# Patient Record
Sex: Male | Born: 1955
Health system: Southern US, Community
[De-identification: ages and names within clinical notes are randomized; demographics above are authoritative.]

## PROBLEM LIST (undated history)

## (undated) DIAGNOSIS — E119 Type 2 diabetes mellitus without complications: Secondary | ICD-10-CM

## (undated) DIAGNOSIS — R809 Proteinuria, unspecified: Secondary | ICD-10-CM

## (undated) DIAGNOSIS — T8859XA Other complications of anesthesia, initial encounter: Secondary | ICD-10-CM

## (undated) DIAGNOSIS — E039 Hypothyroidism, unspecified: Secondary | ICD-10-CM

## (undated) DIAGNOSIS — I10 Essential (primary) hypertension: Secondary | ICD-10-CM

## (undated) DIAGNOSIS — J189 Pneumonia, unspecified organism: Secondary | ICD-10-CM

## (undated) DIAGNOSIS — J45909 Unspecified asthma, uncomplicated: Secondary | ICD-10-CM

## (undated) DIAGNOSIS — IMO0001 Reserved for inherently not codable concepts without codable children: Secondary | ICD-10-CM

## (undated) DIAGNOSIS — J449 Chronic obstructive pulmonary disease, unspecified: Secondary | ICD-10-CM

## (undated) DIAGNOSIS — R011 Cardiac murmur, unspecified: Secondary | ICD-10-CM

## (undated) DIAGNOSIS — T4145XA Adverse effect of unspecified anesthetic, initial encounter: Secondary | ICD-10-CM

---

## 1998-01-25 HISTORY — PX: CORONARY ANGIOPLASTY: SHX604

## 2001-11-23 ENCOUNTER — Inpatient Hospital Stay (HOSPITAL_COMMUNITY): Admission: EM | Admit: 2001-11-23 | Discharge: 2001-11-28 | Payer: Self-pay | Admitting: Internal Medicine

## 2001-11-24 ENCOUNTER — Encounter: Payer: Self-pay | Admitting: Internal Medicine

## 2001-11-28 ENCOUNTER — Encounter: Payer: Self-pay | Admitting: Cardiology

## 2012-01-11 ENCOUNTER — Other Ambulatory Visit: Payer: Self-pay | Admitting: Neurosurgery

## 2012-01-12 ENCOUNTER — Encounter (HOSPITAL_COMMUNITY): Payer: Self-pay | Admitting: Pharmacy Technician

## 2012-01-12 ENCOUNTER — Encounter (HOSPITAL_COMMUNITY): Payer: Self-pay

## 2012-01-12 ENCOUNTER — Encounter (HOSPITAL_COMMUNITY)
Admission: RE | Admit: 2012-01-12 | Discharge: 2012-01-12 | Disposition: A | Discharge status: Home / Self Care | Payer: Managed Care, Other (non HMO) | Source: Ambulatory Visit | Attending: Neurosurgery | Admitting: Neurosurgery

## 2012-01-12 HISTORY — DX: Chronic obstructive pulmonary disease, unspecified: J44.9

## 2012-01-12 HISTORY — DX: Hypothyroidism, unspecified: E03.9

## 2012-01-12 HISTORY — DX: Unspecified asthma, uncomplicated: J45.909

## 2012-01-12 HISTORY — DX: Cardiac murmur, unspecified: R01.1

## 2012-01-12 HISTORY — DX: Type 2 diabetes mellitus without complications: E11.9

## 2012-01-12 HISTORY — DX: Proteinuria, unspecified: R80.9

## 2012-01-12 LAB — SURGICAL PCR SCREEN
MRSA, PCR: NEGATIVE
Staphylococcus aureus: NEGATIVE

## 2012-01-12 LAB — BASIC METABOLIC PANEL
CO2: 23 mEq/L (ref 19–32)
GFR calc non Af Amer: >90 mL/min (ref >90)
Glucose, Bld: 170 mg/dL — ABNORMAL HIGH (ref 70–99)
Potassium: 4.7 mEq/L (ref 3.5–5.1)
Sodium: 136 mEq/L (ref 135–145)

## 2012-01-12 LAB — CBC
Hemoglobin: 14.9 g/dL (ref 13.0–17.0)
Platelets: 269 10*3/uL (ref 150–400)
RBC: 4.88 MIL/uL (ref 4.22–5.81)

## 2012-01-12 NOTE — Progress Notes (Signed)
Primary Physician - Dr. Renae Fickle in [place] Does not have a cardiologist  Stress test and ekg within last 2 years - will request from [place] [facility] Chest xray  May/june 2013 - will request records

## 2012-01-12 NOTE — Pre-Procedure Instructions (Signed)
20 Scott Koch  01/12/2012   Your procedure is scheduled on:  Thursday, December 19th  Report to [facility] at Call at 800 AM at [phone] to find out what time to arrive for surgery.  Call this number if you have problems the morning of surgery: [phone]   Remember:   Do not eat food or drink:After Midnight.    Take these medicines the morning of surgery with A SIP OF WATER:    Do not wear jewelry, make-up or nail polish.  Do not wear lotions, powders, or perfumes.   Do not shave 48 hours prior to surgery. Men may shave face and neck.  Do not bring valuables to the hospital.  Contacts, dentures or bridgework may not be worn into surgery.  Leave suitcase in the car. After surgery it may be brought to your room.  For patients admitted to the hospital, checkout time is 11:00 AM the day of discharge.   Patients discharged the day of surgery will not be allowed to drive home.    Special Instructions:    Please read over the following fact sheets that you were given: Pain Booklet, Coughing and Deep Breathing, MRSA Information and Surgical Site Infection Prevention

## 2012-01-13 ENCOUNTER — Encounter (HOSPITAL_COMMUNITY): Payer: Self-pay | Admitting: *Deleted

## 2012-01-13 ENCOUNTER — Ambulatory Visit (HOSPITAL_COMMUNITY): Payer: Managed Care, Other (non HMO) | Admitting: Critical Care Medicine

## 2012-01-13 ENCOUNTER — Encounter (HOSPITAL_COMMUNITY): Payer: Self-pay | Admitting: Critical Care Medicine

## 2012-01-13 ENCOUNTER — Ambulatory Visit (HOSPITAL_COMMUNITY): Payer: Managed Care, Other (non HMO)

## 2012-01-13 ENCOUNTER — Encounter (HOSPITAL_COMMUNITY)
Admission: RE | Disposition: A | Discharge status: Home / Self Care | Payer: Self-pay | Source: Ambulatory Visit | Attending: Neurosurgery

## 2012-01-13 ENCOUNTER — Ambulatory Visit (HOSPITAL_COMMUNITY)
Admission: RE | Admit: 2012-01-13 | Discharge: 2012-01-14 | Disposition: A | Discharge status: Home / Self Care | Payer: Managed Care, Other (non HMO) | Source: Ambulatory Visit | Attending: Neurosurgery | Admitting: Neurosurgery

## 2012-01-13 DIAGNOSIS — Z01810 Encounter for preprocedural cardiovascular examination: Secondary | ICD-10-CM | POA: Insufficient documentation

## 2012-01-13 DIAGNOSIS — M47812 Spondylosis without myelopathy or radiculopathy, cervical region: Secondary | ICD-10-CM | POA: Insufficient documentation

## 2012-01-13 DIAGNOSIS — E119 Type 2 diabetes mellitus without complications: Secondary | ICD-10-CM | POA: Insufficient documentation

## 2012-01-13 DIAGNOSIS — Z7902 Long term (current) use of antithrombotics/antiplatelets: Secondary | ICD-10-CM | POA: Insufficient documentation

## 2012-01-13 DIAGNOSIS — Z79899 Other long term (current) drug therapy: Secondary | ICD-10-CM | POA: Insufficient documentation

## 2012-01-13 DIAGNOSIS — M5020 Other cervical disc displacement, unspecified cervical region: Secondary | ICD-10-CM | POA: Insufficient documentation

## 2012-01-13 DIAGNOSIS — J449 Chronic obstructive pulmonary disease, unspecified: Secondary | ICD-10-CM | POA: Insufficient documentation

## 2012-01-13 DIAGNOSIS — J4489 Other specified chronic obstructive pulmonary disease: Secondary | ICD-10-CM | POA: Insufficient documentation

## 2012-01-13 DIAGNOSIS — Z01812 Encounter for preprocedural laboratory examination: Secondary | ICD-10-CM | POA: Insufficient documentation

## 2012-01-13 HISTORY — PX: ANTERIOR CERVICAL DECOMP/DISCECTOMY FUSION: SHX1161

## 2012-01-13 LAB — GLUCOSE, CAPILLARY
Glucose-Capillary: 172 mg/dL — ABNORMAL HIGH (ref 70–99)
Glucose-Capillary: 153 mg/dL — ABNORMAL HIGH (ref 70–99)
Glucose-Capillary: 150 mg/dL — ABNORMAL HIGH (ref 70–99)

## 2012-01-13 SURGERY — ANTERIOR CERVICAL DECOMPRESSION/DISCECTOMY FUSION 3 LEVELS
Anesthesia: General | Site: Neck | Wound class: Clean

## 2012-01-13 MED ORDER — ONDANSETRON HCL 4 MG/2ML IJ SOLN
INTRAMUSCULAR | Status: DC | PRN
  Administered 2012-01-13: 4 mg via INTRAVENOUS

## 2012-01-13 MED ORDER — THROMBIN 20000 UNITS EX SOLR
CUTANEOUS | Status: DC | PRN
  Administered 2012-01-13: 18:00:00 via TOPICAL

## 2012-01-13 MED ORDER — OXYCODONE HCL 5 MG/5ML PO SOLN: 5.0000 mg | Freq: Once | ORAL | Status: DC | PRN

## 2012-01-13 MED ORDER — CYCLOBENZAPRINE HCL 10 MG PO TABS
10.0000 mg | ORAL_TABLET | Freq: Three times a day (TID) | ORAL | Status: DC | PRN
  Administered 2012-01-14: 10 mg via ORAL
  Filled 2012-01-13: qty 1

## 2012-01-13 MED ORDER — HYDROMORPHONE HCL PF 1 MG/ML IJ SOLN
0.2500 mg | INTRAMUSCULAR | Status: DC | PRN
  Administered 2012-01-13 (×2): 0.5 mg via INTRAVENOUS

## 2012-01-13 MED ORDER — HEMOSTATIC AGENTS (NO CHARGE) OPTIME
TOPICAL | Status: DC | PRN
  Administered 2012-01-13: 1 via TOPICAL

## 2012-01-13 MED ORDER — DEXAMETHASONE SODIUM PHOSPHATE 4 MG/ML IJ SOLN
INTRAMUSCULAR | Status: DC | PRN
  Administered 2012-01-13: 8 mg via INTRAVENOUS

## 2012-01-13 MED ORDER — VECURONIUM BROMIDE 10 MG IV SOLR
INTRAVENOUS | Status: DC | PRN
  Administered 2012-01-13: 2 mg via INTRAVENOUS
  Administered 2012-01-13: 1 mg via INTRAVENOUS
  Administered 2012-01-13: 2 mg via INTRAVENOUS
  Administered 2012-01-13: 1 mg via INTRAVENOUS
  Administered 2012-01-13: 2 mg via INTRAVENOUS
  Administered 2012-01-13 (×2): 1 mg via INTRAVENOUS

## 2012-01-13 MED ORDER — ASPIRIN EC 81 MG PO TBEC
81.0000 mg | DELAYED_RELEASE_TABLET | Freq: Every day | ORAL | Status: DC
  Filled 2012-01-13: qty 1

## 2012-01-13 MED ORDER — LISINOPRIL 10 MG PO TABS
10.0000 mg | ORAL_TABLET | Freq: Every day | ORAL | Status: DC
  Administered 2012-01-13: 10 mg via ORAL
  Filled 2012-01-13 (×2): qty 1

## 2012-01-13 MED ORDER — NICOTINE 21 MG/24HR TD PT24
21.0000 mg | MEDICATED_PATCH | TRANSDERMAL | Status: DC
  Administered 2012-01-13: 21 mg via TRANSDERMAL
  Filled 2012-01-13 (×2): qty 1

## 2012-01-13 MED ORDER — MIDAZOLAM HCL 5 MG/5ML IJ SOLN
INTRAMUSCULAR | Status: DC | PRN
  Administered 2012-01-13 (×2): 2 mg via INTRAVENOUS

## 2012-01-13 MED ORDER — SODIUM CHLORIDE 0.9 % IV SOLN
INTRAVENOUS | Status: AC
  Filled 2012-01-13: qty 500

## 2012-01-13 MED ORDER — BACITRACIN 50000 UNITS IM SOLR
INTRAMUSCULAR | Status: AC
  Filled 2012-01-13: qty 1

## 2012-01-13 MED ORDER — LIDOCAINE HCL (CARDIAC) 20 MG/ML IV SOLN
INTRAVENOUS | Status: DC | PRN
  Administered 2012-01-13: 80 mg via INTRAVENOUS

## 2012-01-13 MED ORDER — THROMBIN 5000 UNITS EX SOLR
CUTANEOUS | Status: DC | PRN
  Administered 2012-01-13: 5000 [IU] via TOPICAL

## 2012-01-13 MED ORDER — HYDROMORPHONE HCL PF 1 MG/ML IJ SOLN
INTRAMUSCULAR | Status: AC
  Filled 2012-01-13: qty 1

## 2012-01-13 MED ORDER — ROCURONIUM BROMIDE 100 MG/10ML IV SOLN
INTRAVENOUS | Status: DC | PRN
  Administered 2012-01-13: 50 mg via INTRAVENOUS

## 2012-01-13 MED ORDER — LEVOTHYROXINE SODIUM 100 MCG PO TABS
100.0000 ug | ORAL_TABLET | Freq: Every day | ORAL | Status: DC
  Filled 2012-01-13: qty 1

## 2012-01-13 MED ORDER — FENTANYL CITRATE 0.05 MG/ML IJ SOLN
INTRAMUSCULAR | Status: AC
  Filled 2012-01-13: qty 2

## 2012-01-13 MED ORDER — PROPOFOL 10 MG/ML IV BOLUS
INTRAVENOUS | Status: DC | PRN
  Administered 2012-01-13: 200 mg via INTRAVENOUS

## 2012-01-13 MED ORDER — SODIUM CHLORIDE 0.9 % IV SOLN
250.0000 mL | INTRAVENOUS | Status: DC
  Administered 2012-01-13: 250 mL via INTRAVENOUS

## 2012-01-13 MED ORDER — OXYCODONE-ACETAMINOPHEN 5-325 MG PO TABS: 2.0000 | ORAL_TABLET | ORAL | Status: DC | PRN

## 2012-01-13 MED ORDER — ACETAMINOPHEN 325 MG PO TABS: 650.0000 mg | ORAL_TABLET | ORAL | Status: DC | PRN

## 2012-01-13 MED ORDER — METOCLOPRAMIDE HCL 5 MG/ML IJ SOLN: 10.0000 mg | Freq: Once | INTRAMUSCULAR | Status: DC | PRN

## 2012-01-13 MED ORDER — CEFAZOLIN SODIUM 1-5 GM-% IV SOLN: 1.0000 g | Freq: Three times a day (TID) | INTRAVENOUS | Status: DC

## 2012-01-13 MED ORDER — METFORMIN HCL ER 750 MG PO TB24
750.0000 mg | ORAL_TABLET | Freq: Two times a day (BID) | ORAL | Status: DC
  Administered 2012-01-14: 750 mg via ORAL
  Filled 2012-01-13 (×3): qty 1

## 2012-01-13 MED ORDER — PHENYLEPHRINE HCL 10 MG/ML IJ SOLN
INTRAMUSCULAR | Status: DC | PRN
  Administered 2012-01-13: 20 ug via INTRAVENOUS
  Administered 2012-01-13: 40 ug via INTRAVENOUS
  Administered 2012-01-13: 20 ug via INTRAVENOUS
  Administered 2012-01-13: 40 ug via INTRAVENOUS

## 2012-01-13 MED ORDER — SODIUM CHLORIDE 0.9 % IR SOLN
Status: DC | PRN
  Administered 2012-01-13: 18:00:00

## 2012-01-13 MED ORDER — ALBUTEROL SULFATE HFA 108 (90 BASE) MCG/ACT IN AERS: 2.0000 | INHALATION_SPRAY | Freq: Four times a day (QID) | RESPIRATORY_TRACT | Status: DC | PRN

## 2012-01-13 MED ORDER — SODIUM CHLORIDE 0.9 % IJ SOLN: 3.0000 mL | INTRAMUSCULAR | Status: DC | PRN

## 2012-01-13 MED ORDER — ATORVASTATIN CALCIUM 10 MG PO TABS
10.0000 mg | ORAL_TABLET | Freq: Every day | ORAL | Status: DC
  Filled 2012-01-13: qty 1

## 2012-01-13 MED ORDER — DOCUSATE SODIUM 100 MG PO CAPS
100.0000 mg | ORAL_CAPSULE | Freq: Two times a day (BID) | ORAL | Status: DC
  Administered 2012-01-13: 100 mg via ORAL
  Filled 2012-01-13: qty 1

## 2012-01-13 MED ORDER — LACTATED RINGERS IV SOLN
INTRAVENOUS | Status: DC | PRN
  Administered 2012-01-13 (×3): via INTRAVENOUS

## 2012-01-13 MED ORDER — SODIUM CHLORIDE 0.9 % IJ SOLN: 3.0000 mL | Freq: Two times a day (BID) | INTRAMUSCULAR | Status: DC

## 2012-01-13 MED ORDER — ACETAMINOPHEN 650 MG RE SUPP: 650.0000 mg | RECTAL | Status: DC | PRN

## 2012-01-13 MED ORDER — VANCOMYCIN HCL IN DEXTROSE 1-5 GM/200ML-% IV SOLN
INTRAVENOUS | Status: AC
  Administered 2012-01-13: 1000 mg via INTRAVENOUS
  Filled 2012-01-13: qty 200

## 2012-01-13 MED ORDER — GLYCOPYRROLATE 0.2 MG/ML IJ SOLN
INTRAMUSCULAR | Status: DC | PRN
  Administered 2012-01-13: 0.6 mg via INTRAVENOUS

## 2012-01-13 MED ORDER — NEOSTIGMINE METHYLSULFATE 1 MG/ML IJ SOLN
INTRAMUSCULAR | Status: DC | PRN
  Administered 2012-01-13: 4 mg via INTRAVENOUS

## 2012-01-13 MED ORDER — LIDOCAINE HCL 4 % MT SOLN
OROMUCOSAL | Status: DC | PRN
  Administered 2012-01-13: 4 mL via TOPICAL

## 2012-01-13 MED ORDER — 0.9 % SODIUM CHLORIDE (POUR BTL) OPTIME
TOPICAL | Status: DC | PRN
  Administered 2012-01-13: 1000 mL

## 2012-01-13 MED ORDER — MENTHOL 3 MG MT LOZG: 1.0000 | LOZENGE | OROMUCOSAL | Status: DC | PRN

## 2012-01-13 MED ORDER — OXYCODONE HCL 5 MG PO TABS: 5.0000 mg | ORAL_TABLET | Freq: Once | ORAL | Status: DC | PRN

## 2012-01-13 MED ORDER — HYDROMORPHONE HCL PF 1 MG/ML IJ SOLN: 0.5000 mg | INTRAMUSCULAR | Status: DC | PRN

## 2012-01-13 MED ORDER — ONDANSETRON HCL 4 MG/2ML IJ SOLN: 4.0000 mg | INTRAMUSCULAR | Status: DC | PRN

## 2012-01-13 MED ORDER — VANCOMYCIN HCL IN DEXTROSE 1-5 GM/200ML-% IV SOLN
1000.0000 mg | Freq: Once | INTRAVENOUS | Status: AC
  Administered 2012-01-14: 1000 mg via INTRAVENOUS
  Filled 2012-01-13: qty 200

## 2012-01-13 MED ORDER — PHENOL 1.4 % MT LIQD: 1.0000 | OROMUCOSAL | Status: DC | PRN

## 2012-01-13 MED ORDER — FENTANYL CITRATE 0.05 MG/ML IJ SOLN
INTRAMUSCULAR | Status: DC | PRN
Start: 2012-01-13 — End: 2012-01-13
  Administered 2012-01-13 (×5): 50 ug via INTRAVENOUS
  Administered 2012-01-13: 150 ug via INTRAVENOUS
  Administered 2012-01-13: 100 ug via INTRAVENOUS
  Administered 2012-01-13 (×2): 50 ug via INTRAVENOUS

## 2012-01-13 MED ORDER — OXYCODONE-ACETAMINOPHEN 5-325 MG PO TABS
1.0000 | ORAL_TABLET | ORAL | Status: DC | PRN
  Administered 2012-01-14 (×2): 2 via ORAL
  Filled 2012-01-13 (×2): qty 2

## 2012-01-13 SURGICAL SUPPLY — 62 items
ADH SKN CLS APL DERMABOND .7 (GAUZE/BANDAGES/DRESSINGS) ×2
APL SKNCLS STERI-STRIP NONHPOA (GAUZE/BANDAGES/DRESSINGS) ×2
BAG DECANTER FOR FLEXI CONT (MISCELLANEOUS) ×2 IMPLANT
BENZOIN TINCTURE PRP APPL 2/3 (GAUZE/BANDAGES/DRESSINGS) ×3 IMPLANT
BIT DRILL SM SPINE QC 14 (BIT) ×1 IMPLANT
BRUSH SCRUB EZ PLAIN DRY (MISCELLANEOUS) ×2 IMPLANT
BUR MATCHSTICK NEURO 3.0 LAGG (BURR) ×2 IMPLANT
CANISTER SUCTION 2500CC (MISCELLANEOUS) ×2 IMPLANT
CLOTH BEACON ORANGE TIMEOUT ST (SAFETY) ×2 IMPLANT
CONT SPEC 4OZ CLIKSEAL STRL BL (MISCELLANEOUS) ×2 IMPLANT
DECANTER SPIKE VIAL GLASS SM (MISCELLANEOUS) ×2 IMPLANT
DERMABOND ADVANCED (GAUZE/BANDAGES/DRESSINGS) ×2
DERMABOND ADVANCED .7 DNX12 (GAUZE/BANDAGES/DRESSINGS) ×1 IMPLANT
DRAPE C-ARM 42X72 X-RAY (DRAPES) ×4 IMPLANT
DRAPE LAPAROTOMY 100X72 PEDS (DRAPES) ×2 IMPLANT
DRAPE MICROSCOPE ZEISS OPMI (DRAPES) ×1 IMPLANT
DRAPE POUCH INSTRU U-SHP 10X18 (DRAPES) ×2 IMPLANT
DRSG OPSITE 4X5.5 SM (GAUZE/BANDAGES/DRESSINGS) ×3 IMPLANT
ELECT COATED BLADE 2.86 ST (ELECTRODE) ×2 IMPLANT
ELECT REM PT RETURN 9FT ADLT (ELECTROSURGICAL) ×2
ELECTRODE REM PT RTRN 9FT ADLT (ELECTROSURGICAL) ×1 IMPLANT
GAUZE SPONGE 4X4 16PLY XRAY LF (GAUZE/BANDAGES/DRESSINGS) ×1 IMPLANT
GLOVE BIO SURGEON STRL SZ8 (GLOVE) ×2 IMPLANT
GLOVE BIOGEL PI IND STRL 7.5 (GLOVE) IMPLANT
GLOVE BIOGEL PI INDICATOR 7.5 (GLOVE) ×1
GLOVE ECLIPSE 7.5 STRL STRAW (GLOVE) ×2 IMPLANT
GLOVE EXAM NITRILE LRG STRL (GLOVE) ×1 IMPLANT
GLOVE EXAM NITRILE MD LF STRL (GLOVE) ×1 IMPLANT
GLOVE EXAM NITRILE XL STR (GLOVE) IMPLANT
GLOVE EXAM NITRILE XS STR PU (GLOVE) IMPLANT
GLOVE INDICATOR 8.5 STRL (GLOVE) ×2 IMPLANT
GOWN BRE IMP SLV AUR LG STRL (GOWN DISPOSABLE) ×1 IMPLANT
GOWN BRE IMP SLV AUR XL STRL (GOWN DISPOSABLE) ×3 IMPLANT
GOWN STRL REIN 2XL LVL4 (GOWN DISPOSABLE) IMPLANT
HEAD HALTER (SOFTGOODS) ×2 IMPLANT
HEMOSTAT POWDER KIT SURGIFOAM (HEMOSTASIS) ×1 IMPLANT
KIT BASIN OR (CUSTOM PROCEDURE TRAY) ×2 IMPLANT
KIT ROOM TURNOVER OR (KITS) ×2 IMPLANT
NDL SPNL 20GX3.5 QUINCKE YW (NEEDLE) ×1 IMPLANT
NEEDLE SPNL 20GX3.5 QUINCKE YW (NEEDLE) ×2 IMPLANT
NS IRRIG 1000ML POUR BTL (IV SOLUTION) ×2 IMPLANT
PACK LAMINECTOMY NEURO (CUSTOM PROCEDURE TRAY) ×2 IMPLANT
PLATE ANT CERV XTEND 1 LV 14 (Plate) ×1 IMPLANT
PLATE ANT CERV XTEND 2 LV 32 (Plate) ×1 IMPLANT
PUTTY BONE DBX 2.5 MIS (Bone Implant) ×1 IMPLANT
RUBBERBAND STERILE (MISCELLANEOUS) ×4 IMPLANT
SCREW XTD VAR 4.2 SELF TAP (Screw) ×10 IMPLANT
SPACER COLONIAL 7 SZ 9 (Spacer) ×1 IMPLANT
SPACER COLONIAL LGE 8MM 7DEG (Spacer) ×2 IMPLANT
SPONGE GAUZE 4X4 12PLY (GAUZE/BANDAGES/DRESSINGS) ×2 IMPLANT
SPONGE INTESTINAL PEANUT (DISPOSABLE) ×2 IMPLANT
SPONGE SURGIFOAM ABS GEL 100 (HEMOSTASIS) ×2 IMPLANT
STRIP CLOSURE SKIN 1/2X4 (GAUZE/BANDAGES/DRESSINGS) ×2 IMPLANT
SUT VIC AB 3-0 SH 8-18 (SUTURE) ×3 IMPLANT
SUT VICRYL 4-0 PS2 18IN ABS (SUTURE) ×2 IMPLANT
SYR 20ML ECCENTRIC (SYRINGE) ×2 IMPLANT
TAPE CLOTH 4X10 WHT NS (GAUZE/BANDAGES/DRESSINGS) IMPLANT
TAPE STRIPS DRAPE STRL (GAUZE/BANDAGES/DRESSINGS) ×2 IMPLANT
TOWEL OR 17X24 6PK STRL BLUE (TOWEL DISPOSABLE) ×2 IMPLANT
TOWEL OR 17X26 10 PK STRL BLUE (TOWEL DISPOSABLE) ×3 IMPLANT
TRAP SPECIMEN MUCOUS 40CC (MISCELLANEOUS) ×2 IMPLANT
WATER STERILE IRR 1000ML POUR (IV SOLUTION) ×2 IMPLANT

## 2012-01-13 NOTE — Progress Notes (Signed)
Orthopedic Tech Progress Note Patient Details:  Scott Koch 09-Feb-1955 [id number]  Ortho Devices Type of Ortho Device: Soft collar Ortho Device/Splint Location: neck Ortho Device/Splint Interventions: Application   Jennye Moccasin 01/13/2012, 9:09 PM

## 2012-01-13 NOTE — Anesthesia Preprocedure Evaluation (Signed)
Anesthesia Evaluation  Patient identified by MRN, date of birth, ID band Patient awake    Reviewed: Allergy & Precautions, H&P , NPO status , Patient's Chart, lab work & pertinent test results  Airway       Dental  (+) Dental Advisory Given   Pulmonary asthma , COPD COPD inhaler,          Cardiovascular     Neuro/Psych    GI/Hepatic   Endo/Other  diabetes, Oral Hypoglycemic AgentsHypothyroidism   Renal/GU      Musculoskeletal   Abdominal   Peds  Hematology   Anesthesia Other Findings   Reproductive/Obstetrics                           Anesthesia Physical Anesthesia Plan  ASA: III  Anesthesia Plan: General   Post-op Pain Management:    Induction: Intravenous  Airway Management Planned: Oral ETT  Additional Equipment:   Intra-op Plan:   Post-operative Plan: Extubation in OR  Informed Consent: I have reviewed the patients History and Physical, chart, labs and discussed the procedure including the risks, benefits and alternatives for the proposed anesthesia with the patient or authorized representative who has indicated his/her understanding and acceptance.   Dental advisory given  Plan Discussed with: Anesthesiologist and Surgeon  Anesthesia Plan Comments:         Anesthesia Quick Evaluation

## 2012-01-13 NOTE — H&P (Signed)
Scott Koch is an 56 y.o. male.   Chief Complaint: Neck and left arm pain HPI: This is for PLIF to 48-year-old zones of progressive worsening neck and left arm pain ring to his shoulder down to his triceps in the first and is located numbness tingling seems to be shin and some contractures and withdrawing both hands workup I/O included MRI scan which showed a large disc herniation C6-7 displaced left C7 nerve root patient did strike exercise therapy without any significant relief he is on prednisone which helped some of his pain but did not helping of his weakness and has significant left arm weakness. On exam his weakness in his left triceps 4-5 his MRI scan showed spondylitic compression at C3-4 C4-5-1 large disc herniation C6-7 this was left C7 nerve roots I recommended a three-level enter cervical discectomy and fusion.  I have extensively explained the risks and benefits as well as perioperative course expectations of outcome and alternatives to surgery a.m. he understands and agrees to proceed forward.  Past Medical History  Diagnosis Date  . Asthma     inhaler used only when seasonal allergies   . Hypothyroidism   . COPD (chronic obstructive pulmonary disease)   . Heart murmur   . Protein in urine   . Diabetes mellitus without complication     fasting blood sugar avg 120    Past Surgical History  Procedure Date  . Coronary angioplasty     stent    History reviewed. No pertinent family history. Social History:  reports that he has been smoking Cigarettes.  He has a 35 pack-year smoking history. He does not have any smokeless tobacco history on file. He reports that he does not drink alcohol or use illicit drugs.  Allergies:  Allergies  Allergen Reactions  . Other Anaphylaxis    hazelnut  . Penicillins Anaphylaxis  . Sulfa Antibiotics Hives    Medications Prior to Admission  Medication Sig Dispense Refill  . albuterol (PROVENTIL HFA;VENTOLIN HFA) 108 (90 BASE) MCG/ACT inhaler  Inhale 2 puffs into the lungs every 6 (six) hours as needed. For shortness of breath      . aspirin EC 81 MG tablet Take 81 mg by mouth daily.      Marland Kitchen levothyroxine (SYNTHROID, LEVOTHROID) 100 MCG tablet Take 100 mcg by mouth daily.      Marland Kitchen lisinopril (PRINIVIL,ZESTRIL) 10 MG tablet Take 10 mg by mouth daily.      . metFORMIN (GLUCOPHAGE-XR) 750 MG 24 hr tablet Take 750 mg by mouth 2 (two) times daily.      . nicotine (NICODERM CQ - DOSED IN MG/24 HOURS) 21 mg/24hr patch Place 1 patch onto the skin daily.      Marland Kitchen oxyCODONE-acetaminophen (PERCOCET) 10-325 MG per tablet Take 1 tablet by mouth 4 (four) times daily as needed. For pain      . rosuvastatin (CRESTOR) 10 MG tablet Take 10 mg by mouth daily.        Results for orders placed during the hospital encounter of 01/13/12 (from the past 48 hour(s))  GLUCOSE, CAPILLARY     Status: Abnormal   Collection Time   01/13/12  1:42 PM      Component Value Range Comment   Glucose-Capillary 172 (*) 70 - 99 mg/dL   GLUCOSE, CAPILLARY     Status: Abnormal   Collection Time   01/13/12  3:38 PM      Component Value Range Comment   Glucose-Capillary 153 (*) 70 - 99  mg/dL    No results found.  Review of Systems  Constitutional: Negative.   HENT: Positive for neck pain.   Respiratory: Negative.   Cardiovascular: Negative.   Gastrointestinal: Negative.   Genitourinary: Negative.   Musculoskeletal: Positive for myalgias.  Skin: Negative.   Neurological: Positive for tingling and tremors.  Endo/Heme/Allergies: Negative.   Psychiatric/Behavioral: Negative.     Blood pressure 114/74, pulse 99, temperature 98.2 F (36.8 C), temperature source Oral, resp. rate 18, SpO2 97.00%. Physical Exam  Constitutional: He is oriented to person, place, and time. He appears well-developed and well-nourished.  HENT:  Head: Normocephalic.  Eyes: Pupils are equal, round, and reactive to light.  Neck: Normal range of motion.  Cardiovascular: Normal rate.    Respiratory: Effort normal.  GI: Soft. Bowel sounds are normal.  Neurological: He is alert and oriented to person, place, and time. GCS eye subscore is 4. GCS verbal subscore is 5. GCS motor subscore is 6.  Reflex Scores:      Tricep reflexes are 1+ on the right side and 1+ on the left side.      Bicep reflexes are 1+ on the right side and 1+ on the left side.      Brachioradialis reflexes are 1+ on the right side and 1+ on the left side.      Patellar reflexes are 1+ on the right side and 1+ on the left side.      Achilles reflexes are 1+ on the right side and 1+ on the left side.      Right upper extremity strength is 5 out of 5 and his deltoid biceps triceps wrist flexion extension and intrinsics left upper chart C. strength is weak in his triceps are 4 minus out of 5 otherwise he 5 out of 5     Assessment/Plan 55 year old presents for an ACDF at C34, 45, and C6-7  Scott Koch P 01/13/2012, 4:38 PM

## 2012-01-13 NOTE — Preoperative (Signed)
Beta Blockers   Reason not to administer Beta Blockers:Not Applicable 

## 2012-01-13 NOTE — Progress Notes (Signed)
Ortho tech visited and put on correct soft collar size Pt moves all extremities tc feels sensation and pain is 4-5

## 2012-01-13 NOTE — Op Note (Signed)
Preoperative diagnosis: Cervical stenosis C3-4 C4-5 and herniated nucleus pulposus at C6-7 with left-sided C7 radiculopathy and neck pain  Postoperative diagnosis: Same  Procedure: Anterior cervical discectomy and fusion at C3-4 and C4-5 using 8 mm globus peek cages packed with local autograft mixed with DBX and a 32 mm globus extend plate with 295 mm variable-angle screws and into a separate skin incision an anterior cervical discectomy and fusion at C6-7 using a 9 mm globus peek cage packable local are graft mixed DBX and a 14 mm 8 globus extend plate with 621 mm verbal angle screws  Surgeon: Jillyn Hidden Anedra Penafiel  Assistant: Coletta Memos  Anesthesia: Gen.  EBL: Minimal  History of present illness: Patient is a very pleasant 56 year old gentleman who has had progressive worsening neck and prominent left shoulder and arm pain over the last 4 weeks this failed all forms of conservative with exercise therapy and oral prednisone patient clinical exam and significant weakness in his left tricep and workup revealed a large herniated disc at C6-7 displacing the left C7 nerve root in addition also showed severe stenosis spinal cord compression at C3-4 and C4-5 the patient recommended anterior service had discectomies and fusion at C3-4 C4-5 and C6-7 axis with a risks and benefits of the operation with him as well as perioperative course and expectations of outcome and alternatives of surgery he understands and agrees to proceed forward.  Operative procedure: Patient brought into the or was induced under general anesthesia positioned supine the neck in slight extension in 5 pounds of halter traction the right-sided expansion draped in routine sterile fashion 2 incisions were drawn out and confirmed to be the appropriate level with fluoroscopy simple incisions were opened up the superficial layer of the platysma was dissected and divided longitudinally the avascular plane to sternomastoid and strap as was developed a  prefascial from both exposures initially C3-4 C4-5 were exposed and confirmed with interoperative x-ray then this was packed and retractor was removed intense taken at C6-7 at C6-7 the C6-7 disc spaces identified confirmed with intraoperative x-ray and then the disc space was incised with the rongeurs were used to clean the anterior margins of the annulus anterior aspect removed with him a Kerrison punch and the disc space was drilled down a high-speed drill calf in the bone shavings in a mucous trap and then under microscopic illumination the disc space was further drilled down aggressive and viable template in help identify the PLL which was removed on the right side canal in piecemeal fashion. This exposed the thecal sac and in thecal sac was immediately noted to be displaced ventrally from a large free fragment disc herniation this was teased out of the canal with I nerve hook and then under great under biting both endplates March across laterally several more fragments disc removed from the C7 foramen on the left at the foraminotomy at C7 nerve root was widely decompressed skeletonized flush with pedicle. Then marking back to the patient's right side endplates were further under been decompress the central canal the right C7 pedicle was identified the right C7 nerve root is decompressed. Then a 9 mm cages packed inserted after adequate endplate preparation been achieved and a 40 mg placed all screws excellent purchase locking mechanism was engaged. This was impacted this taken back to the original exposure at C3-4 C4-5 with a separate skin incision. The spaces were incised both the space were drilled down capturing the bone shavings in a mucous trap limits illumination first working at C3-4  aggressive and viable template is carried out with a large posterior spur coming off the C3 vertebral body this was aggressively under been decompress the central canal marking laterally both C4 pedicles identifiable C4  nerve roots decompressed. At the discectomy there is no further stenosis either foraminally or centrally then a 8 mm cages packed inserted then tensioning C4-5 and a similar fashion C4-5 was drilled down was large free fragment disc herniation here centrally this was removed and aggressive abutting both endplates of both C5 foramina were opened up and both C5 nerve roots decompressed flush with pedicle. At the discectomy there is no further stenosis this level as well and an 8mm peek Was inserted here then a 32 template was placed all screws excellent purchase locking mechanism was engaged postop fluoroscopy confirmed good position of plate screws and implants at all levels been both at incisions were inspected to confirm it because hemostasis both incisions were irrigated both incisions were closed with after Vicryl in the platysma and a running 4 septic or benzoin and Steri-Strips were applied patient recovered in stable condition. At the end of case on it counts and sponge counts were correct.

## 2012-01-13 NOTE — Transfer of Care (Signed)
Immediate Anesthesia Transfer of Care Note  Patient: Scott Koch  Procedure(s) Performed: Procedure(s) (LRB) with comments: ANTERIOR CERVICAL DECOMPRESSION/DISCECTOMY FUSION 3 LEVELS (N/A) - Cervical three-four, cervical four five, cervical six-seven anterior cervical decompression fusion with plate   Patient Location: PACU  Anesthesia Type:General  Level of Consciousness: awake, alert  and oriented  Airway & Oxygen Therapy: Patient connected to nasal cannula oxygen  Post-op Assessment: Report given to PACU RN, Post -op Vital signs reviewed and stable and Patient moving all extremities X 4  Post vital signs: Reviewed and stable  Complications: No apparent anesthesia complications

## 2012-01-13 NOTE — Progress Notes (Signed)
Dr. Wynetta Emery visited patient at bedside

## 2012-01-13 NOTE — Progress Notes (Signed)
Visited by DR. Wynetta Emery

## 2012-01-13 NOTE — Anesthesia Postprocedure Evaluation (Signed)
Anesthesia Post Note  Patient: Scott Koch  Procedure(s) Performed: Procedure(s) (LRB): ANTERIOR CERVICAL DECOMPRESSION/DISCECTOMY FUSION 3 LEVELS (N/A)  Anesthesia type: general  Patient location: PACU  Post pain: Pain level controlled  Post assessment: Patient's Cardiovascular Status Stable  Last Vitals:  Filed Vitals:   01/13/12 2100  BP: 153/77  Pulse: 87  Temp:   Resp: 14    Post vital signs: Reviewed and stable  Level of consciousness: sedated  Complications: No apparent anesthesia complications

## 2012-01-14 LAB — GLUCOSE, CAPILLARY: Glucose-Capillary: 128 mg/dL — ABNORMAL HIGH (ref 70–99)

## 2012-01-14 MED ORDER — CYCLOBENZAPRINE HCL 10 MG PO TABS: 10.0000 mg | ORAL_TABLET | Freq: Three times a day (TID) | ORAL | Status: DC | PRN

## 2012-01-14 MED ORDER — OXYCODONE-ACETAMINOPHEN 5-325 MG PO TABS: 1.0000 | ORAL_TABLET | ORAL | Status: DC | PRN

## 2012-01-14 NOTE — Progress Notes (Signed)
Subjective: Patient reports Is feeling great no arm pain no numbness strength is coming back  Objective: Vital signs in last 24 hours: Temp:  [98.1 F (36.7 C)-98.5 F (36.9 C)] 98.1 F (36.7 C) (12/20 0809) Pulse Rate:  [65-117] 76  (12/20 0809) Resp:  [14-25] 20  (12/20 0809) BP: (114-181)/(74-99) 119/78 mmHg (12/20 0809) SpO2:  [92 %-97 %] 92 % (12/20 0809)  Intake/Output from previous day: 12/19 0701 - 12/20 0700 In: 2500 [I.V.:2500] Out: 3175 [Urine:3025; Blood:150] Intake/Output this shift: Total I/O In: -  Out: 725 [Urine:725]  Improvement in strength now for out of 5 left tricep  Lab Results:  Basename 01/12/12 1323  WBC 13.2*  HGB 14.9  HCT 44.3  PLT 269   BMET  Basename 01/12/12 1323  NA 136  K 4.7  CL 102  CO2 23  GLUCOSE 170*  BUN 21  CREATININE 0.77  CALCIUM 9.8    Studies/Results: Dg Cervical Spine 2-3 Views  01/13/2012  *RADIOLOGY REPORT*  Clinical Data: ACDF  CERVICAL SPINE - 2-3 VIEW,DG C-ARM GT 120 MIN  Comparison: MRI cervical spine 01/10/2012  Findings: To the lateral fluoroscopic spot views of the cervical spine are submitted.  There postsurgical changes of anterior cervical discectomy and fusion spanning the C3, C4, and C5, and C6- C7.  No complicating feature identified on these fluoroscopic images.  IMPRESSION: ACDF at C3-C5 and C6-C7.   Original Report Authenticated By: Britta Mccreedy, M.D.    Dg C-arm Gt 120 Min  01/13/2012  *RADIOLOGY REPORT*  Clinical Data: ACDF  CERVICAL SPINE - 2-3 VIEW,DG C-ARM GT 120 MIN  Comparison: MRI cervical spine 01/10/2012  Findings: To the lateral fluoroscopic spot views of the cervical spine are submitted.  There postsurgical changes of anterior cervical discectomy and fusion spanning the C3, C4, and C5, and C6- C7.  No complicating feature identified on these fluoroscopic images.  IMPRESSION: ACDF at C3-C5 and C6-C7.   Original Report Authenticated By: Britta Mccreedy, M.D.     Assessment/Plan: Discharge  home  LOS: 1 day     Wilena Tyndall P 01/14/2012, 9:04 AM

## 2012-01-14 NOTE — Discharge Summary (Signed)
  Physician Discharge Summary  Patient ID: Scott Koch MRN: [id number] DOB/AGE: 57-03-1955 57 y.o.  Admit date: 01/13/2012 Discharge date: 01/14/2012  Admission Diagnoses: Herniated nucleus pulposus at C6-7 cervical spondylosis and stenosis at C3-4 C4-5  Discharge Diagnoses: Same Active Problems:  * No active hospital problems. *    Discharged Condition: good  Hospital Course: Patient admitted hospital underwent anterior cervical discectomies and fusion at C3-4 C4-5 and C6-7 postoperatively patient did very well to the floor on the floor patient was convalescing well inability voiding spontaneously tolerating regular diet wound is clean and dry swallowing was significantly improved and patient stable to be discharged home.  Consults: Significant Diagnostic Studies: Treatments: ACDF C3-4, C4-5, and C6-7 Discharge Exam: Blood pressure 119/78, pulse 76, temperature 98.1 F (36.7 C), temperature source Oral, resp. rate 20, SpO2 92.00%. Strength out of 5 wound clean and dry  Disposition: Home     Medication List     As of 01/14/2012  9:06 AM    TAKE these medications         albuterol 108 (90 BASE) MCG/ACT inhaler   Commonly known as: PROVENTIL HFA;VENTOLIN HFA   Inhale 2 puffs into the lungs every 6 (six) hours as needed. For shortness of breath      aspirin EC 81 MG tablet   Take 81 mg by mouth daily.      cyclobenzaprine 10 MG tablet   Commonly known as: FLEXERIL   Take 1 tablet (10 mg total) by mouth 3 (three) times daily as needed for muscle spasms.      levothyroxine 100 MCG tablet   Commonly known as: SYNTHROID, LEVOTHROID   Take 100 mcg by mouth daily.      lisinopril 10 MG tablet   Commonly known as: PRINIVIL,ZESTRIL   Take 10 mg by mouth daily.      metFORMIN 750 MG 24 hr tablet   Commonly known as: GLUCOPHAGE-XR   Take 750 mg by mouth 2 (two) times daily.      nicotine 21 mg/24hr patch   Commonly known as: NICODERM CQ - dosed in mg/24 hours   Place  1 patch onto the skin daily.      oxyCODONE-acetaminophen 5-325 MG per tablet   Commonly known as: PERCOCET/ROXICET   Take 1-2 tablets by mouth every 4 (four) hours as needed.      oxyCODONE-acetaminophen 10-325 MG per tablet   Commonly known as: PERCOCET   Take 1 tablet by mouth 4 (four) times daily as needed. For pain      rosuvastatin 10 MG tablet   Commonly known as: CRESTOR   Take 10 mg by mouth daily.         Signed: Megann Easterwood P 01/14/2012, 9:06 AM

## 2012-01-17 ENCOUNTER — Encounter (HOSPITAL_COMMUNITY): Payer: Self-pay | Admitting: Neurosurgery

## 2012-01-26 HISTORY — PX: SHOULDER ARTHROSCOPY: SHX128

## 2013-12-17 ENCOUNTER — Other Ambulatory Visit: Payer: Self-pay | Admitting: Neurosurgery

## 2014-01-02 ENCOUNTER — Other Ambulatory Visit (HOSPITAL_COMMUNITY): Payer: Managed Care, Other (non HMO)

## 2014-01-02 ENCOUNTER — Encounter (HOSPITAL_COMMUNITY)
Admission: RE | Admit: 2014-01-02 | Discharge: 2014-01-02 | Disposition: A | Discharge status: Home / Self Care | Payer: Managed Care, Other (non HMO) | Source: Ambulatory Visit | Attending: Neurosurgery | Admitting: Neurosurgery

## 2014-01-02 ENCOUNTER — Encounter (HOSPITAL_COMMUNITY): Payer: Self-pay

## 2014-01-02 ENCOUNTER — Encounter (HOSPITAL_COMMUNITY)
Admission: RE | Admit: 2014-01-02 | Discharge: 2014-01-02 | Disposition: A | Discharge status: Home / Self Care | Payer: Managed Care, Other (non HMO) | Source: Ambulatory Visit | Attending: Anesthesiology | Admitting: Anesthesiology

## 2014-01-02 DIAGNOSIS — Z981 Arthrodesis status: Secondary | ICD-10-CM | POA: Insufficient documentation

## 2014-01-02 DIAGNOSIS — R011 Cardiac murmur, unspecified: Secondary | ICD-10-CM | POA: Insufficient documentation

## 2014-01-02 DIAGNOSIS — Z87891 Personal history of nicotine dependence: Secondary | ICD-10-CM | POA: Insufficient documentation

## 2014-01-02 DIAGNOSIS — E669 Obesity, unspecified: Secondary | ICD-10-CM | POA: Diagnosis not present

## 2014-01-02 DIAGNOSIS — I10 Essential (primary) hypertension: Secondary | ICD-10-CM | POA: Insufficient documentation

## 2014-01-02 DIAGNOSIS — J449 Chronic obstructive pulmonary disease, unspecified: Secondary | ICD-10-CM | POA: Insufficient documentation

## 2014-01-02 DIAGNOSIS — I2510 Atherosclerotic heart disease of native coronary artery without angina pectoris: Secondary | ICD-10-CM | POA: Diagnosis not present

## 2014-01-02 DIAGNOSIS — E119 Type 2 diabetes mellitus without complications: Secondary | ICD-10-CM | POA: Insufficient documentation

## 2014-01-02 DIAGNOSIS — R0609 Other forms of dyspnea: Secondary | ICD-10-CM | POA: Diagnosis not present

## 2014-01-02 DIAGNOSIS — Z01818 Encounter for other preprocedural examination: Secondary | ICD-10-CM

## 2014-01-02 HISTORY — DX: Essential (primary) hypertension: I10

## 2014-01-02 HISTORY — DX: Reserved for inherently not codable concepts without codable children: IMO0001

## 2014-01-02 HISTORY — DX: Adverse effect of unspecified anesthetic, initial encounter: T41.45XA

## 2014-01-02 HISTORY — DX: Other complications of anesthesia, initial encounter: T88.59XA

## 2014-01-02 HISTORY — DX: Pneumonia, unspecified organism: J18.9

## 2014-01-02 LAB — BASIC METABOLIC PANEL
Anion gap: 14 (ref 5–15)
BUN: 18 mg/dL (ref 6–23)
CALCIUM: 10.1 mg/dL (ref 8.4–10.5)
CO2: 24 mEq/L (ref 19–32)
Chloride: 98 mEq/L (ref 96–112)
Creatinine, Ser: 0.88 mg/dL (ref 0.50–1.35)
Glucose, Bld: 112 mg/dL — ABNORMAL HIGH (ref 70–99)
POTASSIUM: 4 meq/L (ref 3.7–5.3)
Sodium: 136 mEq/L — ABNORMAL LOW (ref 137–147)

## 2014-01-02 LAB — CBC
HCT: 42.9 % (ref 39.0–52.0)
Hemoglobin: 15 g/dL (ref 13.0–17.0)
MCH: 31.6 pg (ref 26.0–34.0)
MCHC: 35 g/dL (ref 30.0–36.0)
MCV: 90.3 fL (ref 78.0–100.0)
Platelets: 255 10*3/uL (ref 150–400)
RBC: 4.75 MIL/uL (ref 4.22–5.81)
RDW: 12.9 % (ref 11.5–15.5)
WBC: 10.8 10*3/uL — AB (ref 4.0–10.5)

## 2014-01-02 LAB — TYPE AND SCREEN
ABO/RH(D): O NEG
Antibody Screen: NEGATIVE

## 2014-01-02 LAB — SURGICAL PCR SCREEN
MRSA, PCR: NEGATIVE
STAPHYLOCOCCUS AUREUS: NEGATIVE

## 2014-01-02 LAB — ABO/RH: ABO/RH(D): O NEG

## 2014-01-02 NOTE — Progress Notes (Signed)
req'd notes cardiac tests, from pcp [facility]

## 2014-01-02 NOTE — Pre-Procedure Instructions (Addendum)
Scott Koch  01/02/2014   Your procedure is scheduled on:  01/07/14  Report to [facility] short stay admitting at 530 AM.  Call this number if you have problems the morning of surgery: [phone]   Remember:   Do not eat food or drink liquids after midnight.   Take these medicines the morning of surgery with A SIP OF WATER: inhaler if needed ,bring with you, levothyroxine, pain med if needed,singulair   Take all meds as ordered until day of surgery except as instructed below or per dr  Bridgette Habermann all herbel meds, nsaids (aleve,naproxen,advil,ibuprofen) 5 days prior to surgerstarting now including vitamins ,aspirin   No metformin day of surgery   Do not wear jewelry, make-up or nail polish.  Do not wear lotions, powders, or perfumes. You may wear deodorant.  Do not shave 48 hours prior to surgery. Men may shave face and neck.  Do not bring valuables to the hospital.  [facility] is not responsible                  for any belongings or valuables.               Contacts, dentures or bridgework may not be worn into surgery.  Leave suitcase in the car. After surgery it may be brought to your room.  For patients admitted to the hospital, discharge time is determined by your                treatment team.               Patients discharged the day of surgery will not be allowed to drive  home.  Name and phone number of your driver:   Special Instructions:  Special Instructions: Scott Koch - Preparing for Surgery  Before surgery, you can play an important role.  Because skin is not sterile, your skin needs to be as free of germs as possible.  You can reduce the number of germs on you skin by washing with CHG (chlorahexidine gluconate) soap before surgery.  CHG is an antiseptic cleaner which kills germs and bonds with the skin to continue killing germs even after washing.  Please DO NOT use if you have an allergy to CHG or antibacterial soaps.  If your skin becomes reddened/irritated stop using  the CHG and inform your nurse when you arrive at Short Stay.  Do not shave (including legs and underarms) for at least 48 hours prior to the first CHG shower.  You may shave your face.  Please follow these instructions carefully:   1.  Shower with CHG Soap the night before surgery and the morning of Surgery.  2.  If you choose to wash your hair, wash your hair first as usual with your normal shampoo.  3.  After you shampoo, rinse your hair and body thoroughly to remove the Shampoo.  4.  Use CHG as you would any other liquid soap.  You can apply chg directly  to the skin and wash gently with scrungie or a clean washcloth.  5.  Apply the CHG Soap to your body ONLY FROM THE NECK DOWN.  Do not use on open wounds or open sores.  Avoid contact with your eyes ears, mouth and genitals (private parts).  Wash genitals (private parts)       with your normal soap.  6.  Wash thoroughly, paying special attention to the area where your surgery will be performed.  7.  Thoroughly rinse  your body with warm water from the neck down.  8.  DO NOT shower/wash with your normal soap after using and rinsing off the CHG Soap.  9.  Pat yourself dry with a clean towel.            10.  Wear clean pajamas.            11.  Place clean sheets on your bed the night of your first shower and do not sleep with pets.  Day of Surgery  Do not apply any lotions/deodorants the morning of surgery.  Please wear clean clothes to the hospital/surgery center.   Please read over the following fact sheets that you were given: Pain Booklet, Coughing and Deep Breathing, Blood Transfusion Information, MRSA Information and Surgical Site Infection Prevention

## 2014-01-02 NOTE — Progress Notes (Addendum)
Anesthesia chart review:  Patient is a 58 year old male posted for one level posterior fusion on 01/07/14 by Dr. Saintclair Halsted.    History includes CAD s/p stent '00 (Dr. Claiborne Billings), former smoker (quit 12/03/13), COPD, HTN, murmur (not specified), DM2, exertional dyspnea, C3-5 and C6-7 ACDF '13. He gets "rowdy" when he wakes up from anesthesia. BMI is consistent with mild obesity. He reported that he is no longer followed by cardiology--sees his PCP Dr. Wende Neighbors in [place].     EKG on 01/02/14: NSR. He thinks his last stress test was in 2013 at [facility].  Currently, the last stress test available is from 05/21/09: No evidence of ischemia, low normal LVEF.  CXR 01/02/14: No active cardiopulmonary disease.  Preoperative labs noted.   Patient reports that Dr. Micheal Likens is planning to determine clearance status following review of his PAT results.  Chart will be left for follow-up regarding clearance.  Reviewed with anesthesiologist Dr. Orene Desanctis.  George Hugh [facility] Short Stay Center/Anesthesiology Phone [phone] 01/02/2014 4:33 PM  Addendum: I received records from Dr. Micheal Likens.  He last saw patient on 12/27/13.  His note states that if his labs, EKG, CXR are unremarkable that he saw "no contraindications to general anesthesia for lumbar disc surgery." (UA was not done at PAT, so if Dr. Micheal Likens or Dr. Saintclair Halsted would like that done then Dr. Saintclair Halsted will need to order during patient's admission.)  Stress echo from 12/06/12 [facility]): This level of exercise represents an average exercise tolerance for age. Echo images were acquired at peak stress which demonstrated appropriate augmentation of all LV segment with slight decrease in cavity size. Peak EF 75%. No ECG or 2D echocardiographic evidence of inducible ischemia to achieved workload.   Overall, patient's PAT results were unremarkable. (I routed results to Dr. Micheal Likens.) He had recent PCP evaluation, and his last stress test was just over a year  ago.  If no acute changes then I anticipate that he can proceed as planned.  George Hugh [facility] Short Stay Center/Anesthesiology Phone [phone] 01/03/2014 10:54 AM

## 2014-01-07 ENCOUNTER — Inpatient Hospital Stay (HOSPITAL_COMMUNITY): Payer: Managed Care, Other (non HMO) | Admitting: Vascular Surgery

## 2014-01-07 ENCOUNTER — Inpatient Hospital Stay (HOSPITAL_COMMUNITY): Payer: Managed Care, Other (non HMO) | Admitting: Anesthesiology

## 2014-01-07 ENCOUNTER — Inpatient Hospital Stay (HOSPITAL_COMMUNITY)
Admission: RE | Admit: 2014-01-07 | Discharge: 2014-01-08 | DRG: 460 | Disposition: A | Discharge status: Home / Self Care | Payer: Managed Care, Other (non HMO) | Source: Ambulatory Visit | Attending: Neurosurgery | Admitting: Neurosurgery

## 2014-01-07 ENCOUNTER — Inpatient Hospital Stay (HOSPITAL_COMMUNITY): Payer: Managed Care, Other (non HMO)

## 2014-01-07 ENCOUNTER — Encounter (HOSPITAL_COMMUNITY)
Admission: RE | Disposition: A | Discharge status: Home / Self Care | Payer: Self-pay | Source: Ambulatory Visit | Attending: Neurosurgery

## 2014-01-07 ENCOUNTER — Encounter (HOSPITAL_COMMUNITY): Payer: Self-pay

## 2014-01-07 DIAGNOSIS — E119 Type 2 diabetes mellitus without complications: Secondary | ICD-10-CM | POA: Diagnosis present

## 2014-01-07 DIAGNOSIS — M4806 Spinal stenosis, lumbar region: Secondary | ICD-10-CM | POA: Diagnosis present

## 2014-01-07 DIAGNOSIS — Z955 Presence of coronary angioplasty implant and graft: Secondary | ICD-10-CM | POA: Diagnosis not present

## 2014-01-07 DIAGNOSIS — Z981 Arthrodesis status: Secondary | ICD-10-CM | POA: Diagnosis not present

## 2014-01-07 DIAGNOSIS — M5489 Other dorsalgia: Secondary | ICD-10-CM | POA: Diagnosis present

## 2014-01-07 DIAGNOSIS — E039 Hypothyroidism, unspecified: Secondary | ICD-10-CM | POA: Diagnosis present

## 2014-01-07 DIAGNOSIS — I10 Essential (primary) hypertension: Secondary | ICD-10-CM | POA: Diagnosis present

## 2014-01-07 DIAGNOSIS — J449 Chronic obstructive pulmonary disease, unspecified: Secondary | ICD-10-CM | POA: Diagnosis present

## 2014-01-07 DIAGNOSIS — Z7951 Long term (current) use of inhaled steroids: Secondary | ICD-10-CM | POA: Diagnosis not present

## 2014-01-07 DIAGNOSIS — Z87891 Personal history of nicotine dependence: Secondary | ICD-10-CM

## 2014-01-07 DIAGNOSIS — M5126 Other intervertebral disc displacement, lumbar region: Secondary | ICD-10-CM

## 2014-01-07 DIAGNOSIS — J302 Other seasonal allergic rhinitis: Secondary | ICD-10-CM | POA: Diagnosis present

## 2014-01-07 DIAGNOSIS — Z7982 Long term (current) use of aspirin: Secondary | ICD-10-CM

## 2014-01-07 DIAGNOSIS — M4310 Spondylolisthesis, site unspecified: Secondary | ICD-10-CM

## 2014-01-07 HISTORY — DX: Other intervertebral disc displacement, lumbar region: M51.26

## 2014-01-07 LAB — GLUCOSE, CAPILLARY
GLUCOSE-CAPILLARY: 146 mg/dL — AB (ref 70–99)
GLUCOSE-CAPILLARY: 275 mg/dL — AB (ref 70–99)
GLUCOSE-CAPILLARY: 143 mg/dL — AB (ref 70–99)
Glucose-Capillary: 133 mg/dL — ABNORMAL HIGH (ref 70–99)

## 2014-01-07 SURGERY — POSTERIOR LUMBAR FUSION 1 LEVEL
Anesthesia: General | Site: Back

## 2014-01-07 MED ORDER — ONDANSETRON HCL 4 MG/2ML IJ SOLN: 4.0000 mg | Freq: Once | INTRAMUSCULAR | Status: DC | PRN

## 2014-01-07 MED ORDER — ALBUTEROL SULFATE (2.5 MG/3ML) 0.083% IN NEBU: 3.0000 mL | INHALATION_SOLUTION | Freq: Four times a day (QID) | RESPIRATORY_TRACT | Status: DC | PRN

## 2014-01-07 MED ORDER — SODIUM CHLORIDE 0.9 % IJ SOLN: 3.0000 mL | INTRAMUSCULAR | Status: DC | PRN

## 2014-01-07 MED ORDER — POTASSIUM CHLORIDE CRYS ER 10 MEQ PO TBCR
10.0000 meq | EXTENDED_RELEASE_TABLET | Freq: Once | ORAL | Status: AC
  Administered 2014-01-07: 10 meq via ORAL
  Filled 2014-01-07: qty 1

## 2014-01-07 MED ORDER — HYDROCODONE-ACETAMINOPHEN 5-325 MG PO TABS: 1.0000 | ORAL_TABLET | Freq: Four times a day (QID) | ORAL | Status: DC | PRN

## 2014-01-07 MED ORDER — PROPOFOL 10 MG/ML IV BOLUS
INTRAVENOUS | Status: AC
  Filled 2014-01-07: qty 20

## 2014-01-07 MED ORDER — HYDROMORPHONE HCL 1 MG/ML IJ SOLN
0.2500 mg | INTRAMUSCULAR | Status: DC | PRN
  Administered 2014-01-07: 0.5 mg via INTRAVENOUS

## 2014-01-07 MED ORDER — ONDANSETRON HCL 4 MG/2ML IJ SOLN
INTRAMUSCULAR | Status: AC
  Filled 2014-01-07: qty 2

## 2014-01-07 MED ORDER — VANCOMYCIN HCL IN DEXTROSE 1-5 GM/200ML-% IV SOLN
1000.0000 mg | Freq: Two times a day (BID) | INTRAVENOUS | Status: DC
  Administered 2014-01-07 – 2014-01-08 (×2): 1000 mg via INTRAVENOUS
  Filled 2014-01-07 (×3): qty 200

## 2014-01-07 MED ORDER — ONDANSETRON HCL 4 MG/2ML IJ SOLN
4.0000 mg | INTRAMUSCULAR | Status: DC | PRN
Start: 2014-01-07 — End: 2014-01-08

## 2014-01-07 MED ORDER — ASPIRIN EC 81 MG PO TBEC
81.0000 mg | DELAYED_RELEASE_TABLET | Freq: Every day | ORAL | Status: DC
  Administered 2014-01-07 – 2014-01-08 (×2): 81 mg via ORAL
  Filled 2014-01-07 (×2): qty 1

## 2014-01-07 MED ORDER — HYDROMORPHONE HCL 1 MG/ML IJ SOLN: 0.5000 mg | INTRAMUSCULAR | Status: DC | PRN

## 2014-01-07 MED ORDER — NEOSTIGMINE METHYLSULFATE 10 MG/10ML IV SOLN
INTRAVENOUS | Status: DC | PRN
  Administered 2014-01-07: 3 mg via INTRAVENOUS

## 2014-01-07 MED ORDER — MIDAZOLAM HCL 2 MG/2ML IJ SOLN
INTRAMUSCULAR | Status: AC
  Filled 2014-01-07: qty 2

## 2014-01-07 MED ORDER — SODIUM CHLORIDE 0.9 % IR SOLN
Status: DC | PRN
  Administered 2014-01-07: 500 mL

## 2014-01-07 MED ORDER — LACTATED RINGERS IV SOLN
INTRAVENOUS | Status: DC | PRN
  Administered 2014-01-07 (×3): via INTRAVENOUS

## 2014-01-07 MED ORDER — ROCURONIUM BROMIDE 50 MG/5ML IV SOLN
INTRAVENOUS | Status: AC
  Filled 2014-01-07: qty 1

## 2014-01-07 MED ORDER — OXYCODONE-ACETAMINOPHEN 5-325 MG PO TABS
1.0000 | ORAL_TABLET | ORAL | Status: DC | PRN
  Administered 2014-01-07 – 2014-01-08 (×5): 2 via ORAL
  Filled 2014-01-07 (×5): qty 2

## 2014-01-07 MED ORDER — VANCOMYCIN HCL IN DEXTROSE 1-5 GM/200ML-% IV SOLN
INTRAVENOUS | Status: AC
  Administered 2014-01-07: 1000 mg via INTRAVENOUS
  Filled 2014-01-07: qty 200

## 2014-01-07 MED ORDER — METFORMIN HCL ER 500 MG PO TB24
750.0000 mg | ORAL_TABLET | Freq: Two times a day (BID) | ORAL | Status: DC
  Administered 2014-01-07 – 2014-01-08 (×2): 750 mg via ORAL
  Filled 2014-01-07 (×2): qty 1.5

## 2014-01-07 MED ORDER — FUROSEMIDE 40 MG PO TABS
40.0000 mg | ORAL_TABLET | Freq: Every day | ORAL | Status: DC
  Administered 2014-01-07: 40 mg via ORAL
  Filled 2014-01-07 (×2): qty 1

## 2014-01-07 MED ORDER — FENTANYL CITRATE 0.05 MG/ML IJ SOLN
INTRAMUSCULAR | Status: AC
  Filled 2014-01-07: qty 5

## 2014-01-07 MED ORDER — IPRATROPIUM-ALBUTEROL 0.5-2.5 (3) MG/3ML IN SOLN: 3.0000 mL | Freq: Four times a day (QID) | RESPIRATORY_TRACT | Status: DC | PRN

## 2014-01-07 MED ORDER — DEXAMETHASONE SODIUM PHOSPHATE 4 MG/ML IJ SOLN
INTRAMUSCULAR | Status: AC
  Filled 2014-01-07: qty 1

## 2014-01-07 MED ORDER — HYDROMORPHONE HCL 1 MG/ML IJ SOLN
INTRAMUSCULAR | Status: AC
  Filled 2014-01-07: qty 1

## 2014-01-07 MED ORDER — PHENOL 1.4 % MT LIQD: 1.0000 | OROMUCOSAL | Status: DC | PRN

## 2014-01-07 MED ORDER — METOCLOPRAMIDE HCL 5 MG/ML IJ SOLN
INTRAMUSCULAR | Status: AC
  Filled 2014-01-07: qty 2

## 2014-01-07 MED ORDER — MIDAZOLAM HCL 5 MG/5ML IJ SOLN
INTRAMUSCULAR | Status: DC | PRN
  Administered 2014-01-07: 2 mg via INTRAVENOUS

## 2014-01-07 MED ORDER — LOSARTAN POTASSIUM 50 MG PO TABS
100.0000 mg | ORAL_TABLET | Freq: Every day | ORAL | Status: DC
  Administered 2014-01-07: 100 mg via ORAL
  Filled 2014-01-07 (×2): qty 2

## 2014-01-07 MED ORDER — LIDOCAINE HCL (CARDIAC) 20 MG/ML IV SOLN
INTRAVENOUS | Status: DC | PRN
  Administered 2014-01-07: 65 mg via INTRAVENOUS

## 2014-01-07 MED ORDER — 0.9 % SODIUM CHLORIDE (POUR BTL) OPTIME
TOPICAL | Status: DC | PRN
  Administered 2014-01-07: 1000 mL

## 2014-01-07 MED ORDER — ONDANSETRON HCL 4 MG/2ML IJ SOLN
INTRAMUSCULAR | Status: DC | PRN
  Administered 2014-01-07: 4 mg via INTRAVENOUS

## 2014-01-07 MED ORDER — SODIUM CHLORIDE 0.9 % IJ SOLN: 3.0000 mL | Freq: Two times a day (BID) | INTRAMUSCULAR | Status: DC

## 2014-01-07 MED ORDER — ARTIFICIAL TEARS OP OINT
TOPICAL_OINTMENT | OPHTHALMIC | Status: AC
  Filled 2014-01-07: qty 3.5

## 2014-01-07 MED ORDER — BUPIVACAINE HCL (PF) 0.25 % IJ SOLN
INTRAMUSCULAR | Status: DC | PRN
  Administered 2014-01-07: 10 mL
  Administered 2014-01-07: 5 mL

## 2014-01-07 MED ORDER — CYCLOBENZAPRINE HCL 10 MG PO TABS: 10.0000 mg | ORAL_TABLET | Freq: Three times a day (TID) | ORAL | Status: DC | PRN

## 2014-01-07 MED ORDER — DOCUSATE SODIUM 100 MG PO CAPS
100.0000 mg | ORAL_CAPSULE | Freq: Two times a day (BID) | ORAL | Status: DC
  Administered 2014-01-07 – 2014-01-08 (×3): 100 mg via ORAL
  Filled 2014-01-07 (×3): qty 1

## 2014-01-07 MED ORDER — PHENYLEPHRINE 40 MCG/ML (10ML) SYRINGE FOR IV PUSH (FOR BLOOD PRESSURE SUPPORT)
PREFILLED_SYRINGE | INTRAVENOUS | Status: AC
  Filled 2014-01-07: qty 10

## 2014-01-07 MED ORDER — BUDESONIDE-FORMOTEROL FUMARATE 160-4.5 MCG/ACT IN AERO
2.0000 | INHALATION_SPRAY | Freq: Two times a day (BID) | RESPIRATORY_TRACT | Status: DC
  Administered 2014-01-07 – 2014-01-08 (×2): 2 via RESPIRATORY_TRACT
  Filled 2014-01-07: qty 6

## 2014-01-07 MED ORDER — ARTIFICIAL TEARS OP OINT
TOPICAL_OINTMENT | OPHTHALMIC | Status: DC | PRN
  Administered 2014-01-07: 1 via OPHTHALMIC

## 2014-01-07 MED ORDER — GLYCOPYRROLATE 0.2 MG/ML IJ SOLN
INTRAMUSCULAR | Status: DC | PRN
  Administered 2014-01-07: 0.4 mg via INTRAVENOUS

## 2014-01-07 MED ORDER — NEOSTIGMINE METHYLSULFATE 10 MG/10ML IV SOLN
INTRAVENOUS | Status: AC
  Filled 2014-01-07: qty 1

## 2014-01-07 MED ORDER — SODIUM CHLORIDE 0.9 % IV SOLN: 250.0000 mL | INTRAVENOUS | Status: DC

## 2014-01-07 MED ORDER — LIDOCAINE HCL (CARDIAC) 20 MG/ML IV SOLN
INTRAVENOUS | Status: AC
  Filled 2014-01-07: qty 5

## 2014-01-07 MED ORDER — METOCLOPRAMIDE HCL 5 MG/ML IJ SOLN
INTRAMUSCULAR | Status: DC | PRN
  Administered 2014-01-07: 10 mg via INTRAVENOUS

## 2014-01-07 MED ORDER — MONTELUKAST SODIUM 10 MG PO TABS
10.0000 mg | ORAL_TABLET | Freq: Every day | ORAL | Status: DC
  Administered 2014-01-07: 10 mg via ORAL
  Filled 2014-01-07: qty 1

## 2014-01-07 MED ORDER — ACETAMINOPHEN 650 MG RE SUPP: 650.0000 mg | RECTAL | Status: DC | PRN

## 2014-01-07 MED ORDER — SUCCINYLCHOLINE CHLORIDE 20 MG/ML IJ SOLN
INTRAMUSCULAR | Status: AC
  Filled 2014-01-07: qty 1

## 2014-01-07 MED ORDER — MENTHOL 3 MG MT LOZG: 1.0000 | LOZENGE | OROMUCOSAL | Status: DC | PRN

## 2014-01-07 MED ORDER — THROMBIN 20000 UNITS EX SOLR
CUTANEOUS | Status: DC | PRN
  Administered 2014-01-07: 20 mL via TOPICAL

## 2014-01-07 MED ORDER — PROPOFOL 10 MG/ML IV BOLUS
INTRAVENOUS | Status: DC | PRN
  Administered 2014-01-07: 200 mg via INTRAVENOUS
  Administered 2014-01-07: 20 mg via INTRAVENOUS
  Administered 2014-01-07: 30 mg via INTRAVENOUS

## 2014-01-07 MED ORDER — ACETAMINOPHEN 325 MG PO TABS: 650.0000 mg | ORAL_TABLET | ORAL | Status: DC | PRN

## 2014-01-07 MED ORDER — NICOTINE 21 MG/24HR TD PT24
21.0000 mg | MEDICATED_PATCH | TRANSDERMAL | Status: DC
  Administered 2014-01-07: 21 mg via TRANSDERMAL
  Filled 2014-01-07: qty 1

## 2014-01-07 MED ORDER — ROSUVASTATIN CALCIUM 10 MG PO TABS
10.0000 mg | ORAL_TABLET | Freq: Every day | ORAL | Status: DC
  Administered 2014-01-07 – 2014-01-08 (×2): 10 mg via ORAL
  Filled 2014-01-07 (×2): qty 1

## 2014-01-07 MED ORDER — LEVOTHYROXINE SODIUM 100 MCG PO TABS
100.0000 ug | ORAL_TABLET | Freq: Every day | ORAL | Status: DC
  Administered 2014-01-08: 100 ug via ORAL
  Filled 2014-01-07: qty 1

## 2014-01-07 MED ORDER — GLYCOPYRROLATE 0.2 MG/ML IJ SOLN
INTRAMUSCULAR | Status: AC
  Filled 2014-01-07: qty 3

## 2014-01-07 MED ORDER — LIDOCAINE-EPINEPHRINE 1 %-1:100000 IJ SOLN
INTRAMUSCULAR | Status: DC | PRN
  Administered 2014-01-07: 5 mL

## 2014-01-07 MED ORDER — DEXAMETHASONE SODIUM PHOSPHATE 4 MG/ML IJ SOLN
INTRAMUSCULAR | Status: DC | PRN
  Administered 2014-01-07: 4 mg via INTRAVENOUS

## 2014-01-07 MED ORDER — CYCLOBENZAPRINE HCL 10 MG PO TABS
10.0000 mg | ORAL_TABLET | Freq: Three times a day (TID) | ORAL | Status: DC | PRN
  Administered 2014-01-07 – 2014-01-08 (×2): 10 mg via ORAL
  Filled 2014-01-07 (×2): qty 1

## 2014-01-07 MED ORDER — FENTANYL CITRATE 0.05 MG/ML IJ SOLN
INTRAMUSCULAR | Status: DC | PRN
  Administered 2014-01-07: 150 ug via INTRAVENOUS
  Administered 2014-01-07 (×3): 50 ug via INTRAVENOUS
  Administered 2014-01-07: 100 ug via INTRAVENOUS

## 2014-01-07 MED ORDER — PHENYLEPHRINE HCL 10 MG/ML IJ SOLN
INTRAMUSCULAR | Status: DC | PRN
  Administered 2014-01-07 (×5): 40 ug via INTRAVENOUS

## 2014-01-07 MED ORDER — SENNA 8.6 MG PO TABS
1.0000 | ORAL_TABLET | Freq: Two times a day (BID) | ORAL | Status: DC
  Administered 2014-01-07 – 2014-01-08 (×3): 8.6 mg via ORAL
  Filled 2014-01-07 (×3): qty 1

## 2014-01-07 MED ORDER — ROCURONIUM BROMIDE 100 MG/10ML IV SOLN
INTRAVENOUS | Status: DC | PRN
  Administered 2014-01-07: 5 mg via INTRAVENOUS
  Administered 2014-01-07: 50 mg via INTRAVENOUS
  Administered 2014-01-07: 10 mg via INTRAVENOUS

## 2014-01-07 SURGICAL SUPPLY — 77 items
ALLOSTEM STRIP 20MMX50MM (Tissue) ×2 IMPLANT
APL SKNCLS STERI-STRIP NONHPOA (GAUZE/BANDAGES/DRESSINGS) ×1
BAG DECANTER FOR FLEXI CONT (MISCELLANEOUS) ×3 IMPLANT
BENZOIN TINCTURE PRP APPL 2/3 (GAUZE/BANDAGES/DRESSINGS) ×3 IMPLANT
BIT DRILL 5.0/4.0 (BIT) IMPLANT
BLADE CLIPPER SURG (BLADE) IMPLANT
BLADE SURG 11 STRL SS (BLADE) ×3 IMPLANT
BONE ALLOSTEM MORSELIZED 5CC (Bone Implant) ×2 IMPLANT
BRUSH SCRUB EZ PLAIN DRY (MISCELLANEOUS) ×3 IMPLANT
BUR MATCHSTICK NEURO 3.0 LAGG (BURR) ×3 IMPLANT
BUR PRECISION FLUTE 6.0 (BURR) ×3 IMPLANT
CAGE RISE 11-17-15 10X22 (Cage) ×4 IMPLANT
CANISTER SUCT 3000ML (MISCELLANEOUS) ×3 IMPLANT
CAP LOCKING (Cap) ×8 IMPLANT
CAP LOCKING 5.5 CREO (Cap) IMPLANT
CLOSURE WOUND 1/2 X4 (GAUZE/BANDAGES/DRESSINGS) ×2
CONT SPEC 4OZ CLIKSEAL STRL BL (MISCELLANEOUS) ×6 IMPLANT
COVER BACK TABLE 60X90IN (DRAPES) ×3 IMPLANT
DECANTER SPIKE VIAL GLASS SM (MISCELLANEOUS) ×3 IMPLANT
DRAPE C-ARM 42X72 X-RAY (DRAPES) ×6 IMPLANT
DRAPE C-ARMOR (DRAPES) ×4 IMPLANT
DRAPE LAPAROTOMY 100X72X124 (DRAPES) ×3 IMPLANT
DRAPE POUCH INSTRU U-SHP 10X18 (DRAPES) ×3 IMPLANT
DRAPE PROXIMA HALF (DRAPES) IMPLANT
DRAPE SURG 17X23 STRL (DRAPES) ×3 IMPLANT
DRILL 5.0/4.0 (BIT) ×3
DRSG OPSITE 4X5.5 SM (GAUZE/BANDAGES/DRESSINGS) ×6 IMPLANT
DRSG OPSITE POSTOP 3X4 (GAUZE/BANDAGES/DRESSINGS) ×2 IMPLANT
DRSG OPSITE POSTOP 4X6 (GAUZE/BANDAGES/DRESSINGS) ×2 IMPLANT
DURAPREP 26ML APPLICATOR (WOUND CARE) ×3 IMPLANT
ELECT REM PT RETURN 9FT ADLT (ELECTROSURGICAL) ×3
ELECTRODE REM PT RTRN 9FT ADLT (ELECTROSURGICAL) ×1 IMPLANT
EVACUATOR 3/16  PVC DRAIN (DRAIN) ×4
EVACUATOR 3/16 PVC DRAIN (DRAIN) ×1 IMPLANT
GAUZE SPONGE 4X4 12PLY STRL (GAUZE/BANDAGES/DRESSINGS) ×3 IMPLANT
GAUZE SPONGE 4X4 16PLY XRAY LF (GAUZE/BANDAGES/DRESSINGS) IMPLANT
GLOVE BIO SURGEON STRL SZ8 (GLOVE) ×6 IMPLANT
GLOVE ECLIPSE 7.5 STRL STRAW (GLOVE) IMPLANT
GLOVE ECLIPSE 9.0 STRL (GLOVE) ×4 IMPLANT
GLOVE EXAM NITRILE LRG STRL (GLOVE) IMPLANT
GLOVE EXAM NITRILE MD LF STRL (GLOVE) IMPLANT
GLOVE EXAM NITRILE XL STR (GLOVE) IMPLANT
GLOVE EXAM NITRILE XS STR PU (GLOVE) IMPLANT
GLOVE INDICATOR 7.5 STRL GRN (GLOVE) ×6 IMPLANT
GLOVE INDICATOR 8.5 STRL (GLOVE) ×6 IMPLANT
GLOVE SURG SS PI 7.5 STRL IVOR (GLOVE) ×6 IMPLANT
GOWN STRL REUS W/ TWL LRG LVL3 (GOWN DISPOSABLE) IMPLANT
GOWN STRL REUS W/ TWL XL LVL3 (GOWN DISPOSABLE) ×2 IMPLANT
GOWN STRL REUS W/TWL 2XL LVL3 (GOWN DISPOSABLE) IMPLANT
GOWN STRL REUS W/TWL LRG LVL3 (GOWN DISPOSABLE)
GOWN STRL REUS W/TWL XL LVL3 (GOWN DISPOSABLE) ×15
KIT BASIN OR (CUSTOM PROCEDURE TRAY) ×3 IMPLANT
KIT ROOM TURNOVER OR (KITS) ×3 IMPLANT
LIQUID BAND (GAUZE/BANDAGES/DRESSINGS) ×3 IMPLANT
MILL MEDIUM DISP (BLADE) ×2 IMPLANT
NDL HYPO 25X1 1.5 SAFETY (NEEDLE) ×1 IMPLANT
NEEDLE HYPO 25X1 1.5 SAFETY (NEEDLE) ×3 IMPLANT
NS IRRIG 1000ML POUR BTL (IV SOLUTION) ×3 IMPLANT
PACK LAMINECTOMY NEURO (CUSTOM PROCEDURE TRAY) ×3 IMPLANT
PAD ARMBOARD 7.5X6 YLW CONV (MISCELLANEOUS) ×9 IMPLANT
ROD 40MM SPINAL (Rod) ×4 IMPLANT
SCREW CORT CREO 6.0-5.0X30MM (Screw) IMPLANT
SCREW MOD 6.0-5.0X35MM (Screw) ×4 IMPLANT
SCREW PREASSEMBLY 6.0-5.0X35 (Screw) ×4 IMPLANT
SPONGE LAP 4X18 X RAY DECT (DISPOSABLE) IMPLANT
SPONGE SURGIFOAM ABS GEL 100 (HEMOSTASIS) ×3 IMPLANT
STRIP CLOSURE SKIN 1/2X4 (GAUZE/BANDAGES/DRESSINGS) ×4 IMPLANT
SUT VIC AB 0 CT1 18XCR BRD8 (SUTURE) ×2 IMPLANT
SUT VIC AB 0 CT1 8-18 (SUTURE) ×6
SUT VIC AB 2-0 CT1 18 (SUTURE) ×3 IMPLANT
SUT VICRYL 4-0 PS2 18IN ABS (SUTURE) ×3 IMPLANT
SYR 20ML ECCENTRIC (SYRINGE) ×3 IMPLANT
TOWEL OR 17X24 6PK STRL BLUE (TOWEL DISPOSABLE) ×3 IMPLANT
TOWEL OR 17X26 10 PK STRL BLUE (TOWEL DISPOSABLE) ×3 IMPLANT
TRAY FOLEY CATH 16FRSI W/METER (SET/KITS/TRAYS/PACK) ×2 IMPLANT
TULIP CREP AMP 5.5MM (Orthopedic Implant) ×4 IMPLANT
WATER STERILE IRR 1000ML POUR (IV SOLUTION) ×3 IMPLANT

## 2014-01-07 NOTE — Op Note (Signed)
Preoperative diagnosis: Lumbar spinal stenosis degenerative disc disease herniated nucleus pulposus L4-5 with severe foraminal stenosis of the L4 and L5 nerve roots.  Postoperative diagnosis: Same  Procedure: #1 decompressive lumbar laminectomy L4-5 with complete medial facetectomies radical foraminotomies of the L4 and L5 nerve roots bilaterally in excess and requiring more work to would be needed with a standard interbody fusion  #2 posterior lumbar interbody fusion using the globus rise expandable peek cages packed with local autograft mixed with allostem morsels  #3 cortical screw fixation using the globus Creo modular cortical screw stated L4-5  #4+ on a large Hemovac drain  Surgeon: Dominica Severin Camari Wisham  Asst.: Jonni Sanger pool  Anesthesia: Gen.  EBL: Minimal  History of present illness: Patient is a very pleasant Scott Koch and is a progress worsening back and right greater than left leg pain patient was failed all forms of conservative treatment workup revealed a large disc herniation extending pre-foraminally and an extraforaminal he on the right at L4-5 causing severe stenosis and displacement of the L4 and L5 nerve roots. Patient marked facet arthropathy and severe generative disease and due to the failure conservative treatment imaging findings and progression of clinical syndrome I recommended decompressive laminectomy with complete medial facetectomy in order to adequately decompress the L4 and L5 nerve roots with subsequent would necessitate a stabilization procedure. I've extensively gone over that risks and benefits as well as perioperative course expectations of outcome and alternatives surgery and he understands and agrees to proceed forward.  Operative procedure: Patient brought into the or was induced under general anesthesia positioned prone the Wilson frame his back was prepped and draped in routine sterile fashion preoperative x-ray localize the appropriate level so after infiltration of 10  mL lidocaine with epi a midline incision was made and Bovie light cautery was used to calcification subperiosteal dissections care lamina of L4 and L5 bilaterally. The facet complexes at L4-5 were exposed dissected out the lateral pars and the junction lateral pars of the L3-4 facet complex and then using AP and lateral fluoroscopy identify the entry point on the 5 and 7:00 positions of both pedicles respectively and then using lateral fluoroscopy I drilled a 30 mm hole probed and tapped with a 50 tap probed again and a 6050 screw was placed with excellent purchase under fluoroscopic guidance. The trajectory was superiorly and slightly laterally. After both screws were placed both AP and lateral fluoroscopy confirmed good position of the screws at the edges intake and the decompression. The spinous process of L4 smooth central decompression was begun the facet complexes were drilled down complete medial facetectomies were performed a radical foraminotomies unroofing the L4 nerve roots bilaterally extending down to the L5 foramen. There was marked compression of the 4 root on the right due to combination of marked facet arthropathy but also a large disc herniation extending pre-foraminally foraminal and extra foraminally. This disc was incised and cleanout radically both sides using sequential distraction working with an 11 distractor and the patient's left side of the space on the right was cleaned out several large fracture move the lateral compartment underneath the 4 roots as well as centrally underneath the fiber. Then after adequate endplate preparation been achieved, an 11 expandable cage was inserted and opened up approximate 3 turns. This significantly opened up the disc space and decompress the L4 nerve root. Then the distractor was removed fluoroscopy confirmed good position of the implant cleaned out the disc on the left side as well as large central herniation.  Extending laterally in left also cleanout  that extra foraminal space. Then. The endplates local autograft and allostem morsels and sponge were placed anteriorly and centrally. The contralateral cages inserted and expanded with similar fashion. Then tensioning the screws at L5 and a similar fashion directing straight and and slightly laterally these overdrilled a 30 mm and 35 mm screws are placed. All screws excellent purchase the heads were assembled on the 4 screws and advanced slightly more AP and lateral fluoroscopy confirmed good position of all the screws and implants. The wounds and copious irrigated meticulous in space was maintained 40 mm rods and placed all nuts were then tightened down and torqued down the foraminal reinspected confirm patency no migration of graft material. Gelfoam was laid up the dura large retractor was placed and the wounds closed in layers with interrupted Vicryl the skin screws running 4 subcuticular benzoin and Steri-Strips are applied patient recovered in stable condition. At the end of case all needle counts sponge counts were correct.

## 2014-01-07 NOTE — Progress Notes (Signed)
CARE MANAGEMENT NOTE 01/07/2014  Patient:  Scott Koch,Scott Koch   Account Number:  [id number]  Date Initiated:  01/07/2014  Documentation initiated by:  Olga Coaster  Subjective/Objective Assessment:   ADMITTED FOR SURGERY     Action/Plan:   CM FOLLOWING FOR DCP   Anticipated DC Date:  01/11/2014   Anticipated DC Plan:  AWAITING FOR PT/OT EVALS FOR DISPOSITION NEEDS     DC Planning Services  CM consult         Status of service:  In process, will continue to follow Medicare Important Message given?   (If response is "NO", the following Medicare IM given date fields will be blank)  Per UR Regulation:  Reviewed for med. necessity/level of care/duration of stay  Comments:  12/14/2015Mindi Slicker RN,BSN,MHA [phone]

## 2014-01-07 NOTE — Anesthesia Preprocedure Evaluation (Signed)
Anesthesia Evaluation  Patient identified by MRN, date of birth, ID band Patient awake    Reviewed: Allergy & Precautions, H&P , NPO status , Patient's Chart, lab work & pertinent test results  Airway        Dental   Pulmonary asthma , COPDformer smoker,          Cardiovascular hypertension,     Neuro/Psych    GI/Hepatic   Endo/Other  diabetes, Type 2, Oral Hypoglycemic AgentsHypothyroidism   Renal/GU      Musculoskeletal   Abdominal   Peds  Hematology   Anesthesia Other Findings   Reproductive/Obstetrics                             Anesthesia Physical Anesthesia Plan  ASA: III  Anesthesia Plan: General   Post-op Pain Management:    Induction: Intravenous  Airway Management Planned: Oral ETT  Additional Equipment:   Intra-op Plan:   Post-operative Plan: Extubation in OR  Informed Consent: I have reviewed the patients History and Physical, chart, labs and discussed the procedure including the risks, benefits and alternatives for the proposed anesthesia with the patient or authorized representative who has indicated his/her understanding and acceptance.     Plan Discussed with: CRNA, Anesthesiologist and Surgeon  Anesthesia Plan Comments:         Anesthesia Quick Evaluation

## 2014-01-07 NOTE — Progress Notes (Signed)
ANTIBIOTIC CONSULT NOTE - INITIAL  Pharmacy Consult for Vancomycin Indication: Post op surgical prophylaxis  Allergies  Allergen Reactions  . Other Anaphylaxis    Hazelnut NOELLE (sinus medication)  . Penicillins Anaphylaxis  . Sulfa Antibiotics Hives  . Neosporin [Neomycin-Bacitracin Zn-Polymyx] Rash    REDNESS AROUND AREA APPLIED    Patient Measurements:  Wt 110kg Ht: 72in  Vital Signs: Temp: 97.7 F (36.5 C) (12/14 1123) BP: 121/80 mmHg (12/14 1115) Pulse Rate: 70 (12/14 1123) Intake/Output from previous day:   Intake/Output from this shift: Total I/O In: 2050 [I.V.:2050] Out: 405 [Urine:80; Drains:25; Blood:300]  Labs: No results for input(s): WBC, HGB, PLT, LABCREA, CREATININE in the last 72 hours. Estimated Creatinine Clearance: 115.8 mL/min (by C-G formula based on Cr of 0.88). No results for input(s): VANCOTROUGH, VANCOPEAK, VANCORANDOM, GENTTROUGH, GENTPEAK, GENTRANDOM, TOBRATROUGH, TOBRAPEAK, TOBRARND, AMIKACINPEAK, AMIKACINTROU, AMIKACIN in the last 72 hours.   Microbiology: Recent Results (from the past 720 hour(s))  Surgical pcr screen     Status: None   Collection Time: 01/02/14  2:33 PM  Result Value Ref Range Status   MRSA, PCR NEGATIVE NEGATIVE Final   Staphylococcus aureus NEGATIVE NEGATIVE Final    Comment:        The Xpert SA Assay (FDA approved for NASAL specimens in patients over 26 years of age), is one component of a comprehensive surveillance program.  Test performance has been validated by [employer] for patients greater than or equal to 38 year old. It is not intended to diagnose infection nor to guide or monitor treatment.     Medical History: Past Medical History  Diagnosis Date  . Asthma     inhaler used only when seasonal allergies   . Hypothyroidism   . COPD (chronic obstructive pulmonary disease)   . Heart murmur   . Protein in urine   . Diabetes mellitus without complication     fasting blood sugar avg 120   . Hypertension   . Pneumonia     hx  . Shortness of breath dyspnea     hx  . Complication of anesthesia     patient states"gets rowdy" when wake up    Medications:  Prescriptions prior to admission  Medication Sig Dispense Refill Last Dose  . albuterol (PROVENTIL HFA;VENTOLIN HFA) 108 (90 BASE) MCG/ACT inhaler Inhale 2 puffs into the lungs every 6 (six) hours as needed. For shortness of breath   01/07/2014 at Unknown time  . aspirin EC 81 MG tablet Take 81 mg by mouth daily.   01/02/2014  . budesonide-formoterol (SYMBICORT) 160-4.5 MCG/ACT inhaler Inhale 2 puffs into the lungs 2 (two) times daily.   01/06/2014 at Unknown time  . cyclobenzaprine (FLEXERIL) 10 MG tablet Take 1 tablet (10 mg total) by mouth 3 (three) times daily as needed for muscle spasms. 80 tablet 1 01/06/2014 at Unknown time  . furosemide (LASIX) 40 MG tablet Take 40 mg by mouth daily.   01/06/2014 at Unknown time  . HYDROcodone-acetaminophen (NORCO/VICODIN) 5-325 MG per tablet Take 1 tablet by mouth every 6 (six) hours as needed for moderate pain.   01/06/2014 at Unknown time  . ipratropium-albuterol (DUONEB) 0.5-2.5 (3) MG/3ML SOLN Take 3 mLs by nebulization every 6 (six) hours as needed (Shortness of breath).   Past Week at Unknown time  . levothyroxine (SYNTHROID, LEVOTHROID) 100 MCG tablet Take 100 mcg by mouth daily.   01/07/2014 at Unknown time  . losartan (COZAAR) 100 MG tablet Take 100 mg by mouth daily.  01/06/2014 at Unknown time  . metFORMIN (GLUCOPHAGE-XR) 750 MG 24 hr tablet Take 750 mg by mouth 2 (two) times daily.   01/06/2014 at Unknown time  . montelukast (SINGULAIR) 10 MG tablet Take 10 mg by mouth at bedtime.   01/06/2014 at Unknown time  . nicotine (NICODERM CQ - DOSED IN MG/24 HOURS) 21 mg/24hr patch Place 1 patch onto the skin daily.   01/12/2012 at Unknown  . potassium chloride (MICRO-K) 10 MEQ CR capsule Take 10 mEq by mouth daily.   01/06/2014 at Unknown time  . rosuvastatin (CRESTOR) 10 MG  tablet Take 10 mg by mouth daily.   01/06/2014 at Unknown time  . oxyCODONE-acetaminophen (PERCOCET/ROXICET) 5-325 MG per tablet Take 1-2 tablets by mouth every 4 (four) hours as needed. (Patient not taking: Reported on 01/02/2014) 80 tablet 0    Assessment: 58yom s/p spinal procedure to receive Vancomycin for post-op surgical prophylaxis. Per Op Note, hemovac drain was placed so will continue Vancomycin until discontinued by physician per protocol. Patient has PCN allergy (anaphylaxis) and received Vancomycin 1g pre-op ~0730. - Wt 110kg, Crcl >100 ml/min  Goal of Therapy:  Vancomycin trough level 10-15 mcg/ml  Plan:  1. Vancomycin 1g IV q12h 2. Monitor renal function, clinical course and Neurosurgery plans  Earleen Newport  [phone] 01/07/2014,11:45 AM

## 2014-01-07 NOTE — Anesthesia Postprocedure Evaluation (Signed)
  Anesthesia Post-op Note  Patient: Scott Koch  Procedure(s) Performed: Procedure(s): LUMBAR FOUR TO FIVE POSTERIOR LUMBAR FUSION 1 LEVEL (N/A)  Patient Location: PACU  Anesthesia Type:General  Level of Consciousness: awake, alert , oriented and patient cooperative  Airway and Oxygen Therapy: Patient Spontanous Breathing  Post-op Pain: mild  Post-op Assessment: Post-op Vital signs reviewed, Patient's Cardiovascular Status Stable, Respiratory Function Stable, Patent Airway, No signs of Nausea or vomiting and Pain level controlled  Post-op Vital Signs: stable  Last Vitals:  Filed Vitals:   01/07/14 1045  BP: 144/81  Pulse: 66  Temp:   Resp: 10    Complications: No apparent anesthesia complications

## 2014-01-07 NOTE — H&P (Signed)
Quintyn Dombek is an 58 y.o. male.   Chief Complaint: Back and right leg pain HPI: Patient is a very pleasant 58 year old gym is a progress worsening back and bilateral leg pain worse the right ring down the posterior aspect of his 5 posterior lateral calf Thomas foot and big toe consistent with an L5 nerve root pattern. Workup revealed severe lumbar spinal stenosis with disc herniation and a large extraforaminal component requiring complete facetectomy.. Due to patient's failure conservative treatment imaging findings and progression of clinical syndrome I recommended decompression stabilization procedure at L4-5. I've extensively gone over the risks and benefits of the operation with the patient as well as perioperative course expectations of outcome and alternatives of surgery and he understands and agrees to proceed forward.  Past Medical History  Diagnosis Date  . Asthma     inhaler used only when seasonal allergies   . Hypothyroidism   . COPD (chronic obstructive pulmonary disease)   . Heart murmur   . Protein in urine   . Diabetes mellitus without complication     fasting blood sugar avg 120  . Hypertension   . Pneumonia     hx  . Shortness of breath dyspnea     hx  . Complication of anesthesia     patient states"gets rowdy" when wake up    Past Surgical History  Procedure Laterality Date  . Anterior cervical decomp/discectomy fusion  01/13/2012    Procedure: ANTERIOR CERVICAL DECOMPRESSION/DISCECTOMY FUSION 3 LEVELS;  Surgeon: Elaina Hoops, MD;  Location: [facility];  Service: Neurosurgery;  Laterality: N/A;  Cervical three-four, cervical four five, cervical six-seven anterior cervical decompression fusion with plate   . Shoulder arthroscopy Right 14  . Coronary angioplasty  2000    stent    History reviewed. No pertinent family history. Social History:  reports that he quit smoking about 5 weeks ago. His smoking use included Cigarettes. He has a 35 pack-year smoking history.  He does not have any smokeless tobacco history on file. He reports that he does not drink alcohol or use illicit drugs.  Allergies:  Allergies  Allergen Reactions  . Other Anaphylaxis    Hazelnut NOELLE (sinus medication)  . Penicillins Anaphylaxis  . Sulfa Antibiotics Hives  . Neosporin [Neomycin-Bacitracin Zn-Polymyx] Rash    REDNESS AROUND AREA APPLIED    Medications Prior to Admission  Medication Sig Dispense Refill  . albuterol (PROVENTIL HFA;VENTOLIN HFA) 108 (90 BASE) MCG/ACT inhaler Inhale 2 puffs into the lungs every 6 (six) hours as needed. For shortness of breath    . aspirin EC 81 MG tablet Take 81 mg by mouth daily.    . budesonide-formoterol (SYMBICORT) 160-4.5 MCG/ACT inhaler Inhale 2 puffs into the lungs 2 (two) times daily.    . cyclobenzaprine (FLEXERIL) 10 MG tablet Take 1 tablet (10 mg total) by mouth 3 (three) times daily as needed for muscle spasms. 80 tablet 1  . furosemide (LASIX) 40 MG tablet Take 40 mg by mouth daily.    Marland Kitchen HYDROcodone-acetaminophen (NORCO/VICODIN) 5-325 MG per tablet Take 1 tablet by mouth every 6 (six) hours as needed for moderate pain.    Marland Kitchen ipratropium-albuterol (DUONEB) 0.5-2.5 (3) MG/3ML SOLN Take 3 mLs by nebulization every 6 (six) hours as needed (Shortness of breath).    Marland Kitchen levothyroxine (SYNTHROID, LEVOTHROID) 100 MCG tablet Take 100 mcg by mouth daily.    Marland Kitchen losartan (COZAAR) 100 MG tablet Take 100 mg by mouth daily.    . metFORMIN (GLUCOPHAGE-XR) 750  MG 24 hr tablet Take 750 mg by mouth 2 (two) times daily.    . montelukast (SINGULAIR) 10 MG tablet Take 10 mg by mouth at bedtime.    . nicotine (NICODERM CQ - DOSED IN MG/24 HOURS) 21 mg/24hr patch Place 1 patch onto the skin daily.    . potassium chloride (MICRO-K) 10 MEQ CR capsule Take 10 mEq by mouth daily.    . rosuvastatin (CRESTOR) 10 MG tablet Take 10 mg by mouth daily.    Marland Kitchen oxyCODONE-acetaminophen (PERCOCET/ROXICET) 5-325 MG per tablet Take 1-2 tablets by mouth every 4 (four)  hours as needed. (Patient not taking: Reported on 01/02/2014) 80 tablet 0    Results for orders placed or performed during the hospital encounter of 01/07/14 (from the past 48 hour(s))  Glucose, capillary     Status: Abnormal   Collection Time: 01/07/14  6:12 AM  Result Value Ref Range   Glucose-Capillary 133 (H) 70 - 99 mg/dL   No results found.  Review of Systems  Constitutional: Negative.   HENT: Negative.   Eyes: Negative.   Respiratory: Negative.   Cardiovascular: Negative.   Gastrointestinal: Negative.   Genitourinary: Negative.   Musculoskeletal: Positive for myalgias, back pain and joint pain.  Skin: Negative.   Neurological: Positive for tingling and sensory change.  Psychiatric/Behavioral: Negative.     Blood pressure 128/83, pulse 69, temperature 98.2 F (36.8 C), resp. rate 20, SpO2 98 %. Physical Exam  Constitutional: He is oriented to person, place, and time. He appears well-developed and well-nourished.  HENT:  Head: Normocephalic.  Eyes: Pupils are equal, round, and reactive to light.  Neck: Normal range of motion.  Cardiovascular: Normal rate.   Respiratory: Effort normal.  GI: Soft.  Neurological: He is alert and oriented to person, place, and time. He has normal strength. GCS eye subscore is 4. GCS verbal subscore is 5. GCS motor subscore is 6.  Strength is 5 out of 5 in his iliopsoas, quads, hip she's, gastric, and tibialis, and EHL.  Skin: Skin is warm and dry.     Assessment/Plan 53 year gentleman presents for L4-5 decompression stabilization procedure.  Jeremian Whitby P 01/07/2014, 7:15 AM

## 2014-01-07 NOTE — Transfer of Care (Signed)
Immediate Anesthesia Transfer of Care Note  Patient: Scott Koch  Procedure(s) Performed: Procedure(s): LUMBAR FOUR TO FIVE POSTERIOR LUMBAR FUSION 1 LEVEL (N/A)  Patient Location: PACU  Anesthesia Type:General  Level of Consciousness: awake, alert , oriented and patient cooperative  Airway & Oxygen Therapy: Patient Spontanous Breathing and Patient connected to face mask oxygen  Post-op Assessment: Report given to PACU RN and Post -op Vital signs reviewed and stable  Post vital signs: Reviewed and stable  Complications: No apparent anesthesia complications

## 2014-01-08 LAB — GLUCOSE, CAPILLARY: GLUCOSE-CAPILLARY: 168 mg/dL — AB (ref 70–99)

## 2014-01-08 MED ORDER — OXYCODONE-ACETAMINOPHEN 5-325 MG PO TABS: 1.0000 | ORAL_TABLET | ORAL | Status: DC | PRN

## 2014-01-08 MED ORDER — CYCLOBENZAPRINE HCL 10 MG PO TABS: 10.0000 mg | ORAL_TABLET | Freq: Three times a day (TID) | ORAL | Status: DC | PRN

## 2014-01-08 NOTE — Discharge Instructions (Signed)
No lifting no bending no twisting no driving a riding a car unless he is going back and forth to see me. Keep incision clean dry and intact. May remove the outer dressing in 2-3 days leave the Steri-Strips on and I will take them off in the office. Cover the Steri-Strips with saran wrap for showers only.

## 2014-01-08 NOTE — Progress Notes (Signed)
Occupational Therapy Evaluation Patient Details Name: Scott Koch MRN: [id number] DOB: [birth year] Today's Date: 01/08/2014    History of Present Illness 58 yo male s/p L4-5 PLIF  PMH: CAD, COPD, HTN, Murmure, DM2, ACDF 2013, obesity. dyspnea, asthma, hypothryoidism   Clinical Impression   Patient evaluated by Occupational Therapy with no further acute OT needs identified. All education has been completed and the patient has no further questions. See below for any follow-up Occupational Therapy or equipment needs. OT to sign off. Thank you for referral.     Follow Up Recommendations  No OT follow up;Supervision/Assistance - 24 hour    Equipment Recommendations  Other (comment) (RW-2 wheeled; hip kit to be purchase by pt)    Recommendations for Other Services       Precautions / Restrictions Precautions Precautions: Back Precaution Booklet Issued: Yes (comment) Precaution Comments: handout provided and reviewed in detail Required Braces or Orthoses: Spinal Brace Spinal Brace: Lumbar corset;Applied in sitting position Restrictions Weight Bearing Restrictions: No      Mobility Bed Mobility Overal bed mobility: Needs Assistance Bed Mobility: Rolling;Sit to Sidelying Rolling: Supervision       Sit to sidelying: Min guard General bed mobility comments: HOB flat; no use of bedrails. Verbal cues for log roll sequence.  Transfers Overall transfer level: Needs assistance Equipment used: Rolling walker (2 wheeled) Transfers: Sit to/from Stand Sit to Stand: Min guard         General transfer comment: Min guard (A) for balance and safety. Verbal cues for hand placement. Pt attempting to pull up on RW to stand.     Balance Overall balance assessment: Needs assistance Sitting-balance support: No upper extremity supported;Feet supported Sitting balance-Leahy Scale: Good     Standing balance support: Bilateral upper extremity supported;During functional activity Standing  balance-Leahy Scale: Fair                              ADL Overall ADL's : Needs assistance/impaired     Grooming: Oral care;Wash/dry hands;Min guard;Cueing for compensatory techniques;Standing               Lower Body Dressing: Supervision/safety;With adaptive equipment;Cueing for compensatory techniques;Adhering to back precautions   Toilet Transfer: Min guard;Cueing for safety;Ambulation;Regular Toilet;RW   Toileting- Clothing Manipulation and Hygiene: Min guard;Cueing for safety;Adhering to back precautions;Sit to/from stand   Tub/ Shower Transfer: Walk-in shower;Min guard;Adhering to back precautions;Cueing for safety;Ambulation;Rolling walker   Functional mobility during ADLs: Min guard;Rolling walker General ADL Comments: Educated and had pt use 2 cups for oral care to avoid bending over sink. Educated and practiced with AE for LB adls. Completed several transfers and pt required verbal cues for hand placement. Overall good demo of back precautions during ADLs.      Vision                     Perception     Praxis      Pertinent Vitals/Pain Pain Assessment: 0-10 Pain Score: 6  Pain Location: back Pain Descriptors / Indicators: Sore Pain Intervention(s): Monitored during session;Repositioned;Premedicated before session     Hand Dominance Right   Extremity/Trunk Assessment Upper Extremity Assessment Upper Extremity Assessment: Overall WFL for tasks assessed   Lower Extremity Assessment Lower Extremity Assessment: Defer to PT evaluation   Cervical / Trunk Assessment Cervical / Trunk Assessment: Normal   Communication Communication Communication: No difficulties   Cognition Arousal/Alertness: Awake/alert Behavior During Therapy: Anxious Overall  Cognitive Status: Within Functional Limits for tasks assessed       Memory: Decreased recall of precautions             General Comments       Exercises       Shoulder  Instructions      Home Living Family/patient expects to be discharged to:: Private residence Living Arrangements: Spouse/significant other;Parent Available Help at Discharge: Family;Available 24 hours/day Type of Home: House Home Access: Stairs to enter [employer] of Steps: 3 Entrance Stairs-Rails: None Home Layout: One level     Bathroom Shower/Tub: Walk-in shower;Door   Bathroom Toilet: Handicapped height     Home Equipment: Walker - standard;Cane - single point;Shower seat - built in   Additional Comments: Wife will be home 24/7 for the first week. After she will return to work on night shift      Prior Functioning/Environment Level of Independence: Independent with assistive device(s)        Comments: Pt was using a single point cane due to incr pain. Likes to garden and work in workshop    OT Diagnosis: Generalized weakness;Acute pain   OT Problem List: Decreased strength;Decreased range of motion;Decreased activity tolerance;Impaired balance (sitting and/or standing);Decreased coordination;Decreased safety awareness;Decreased knowledge of precautions;Decreased knowledge of use of DME or AE;Pain;Obesity   OT Treatment/Interventions:      OT Goals(Current goals can be found in the care plan section) Acute Rehab OT Goals Patient Stated Goal: to go home OT Goal Formulation: With patient Time For Goal Achievement: 01/22/14 Potential to Achieve Goals: Good  OT Frequency:     Barriers to D/C:            Co-evaluation              End of Session Equipment Utilized During Treatment: Gait belt;Rolling walker;Back brace Nurse Communication: Mobility status;Precautions  Activity Tolerance: Patient tolerated treatment well Patient left: Other (comment) (Sitting EOB with PT)   Time: 2800-3491 OT Time Calculation (min): 25 min Charges:    G-Codes:    Redmond Baseman January 30, 2014, 10:11 AM

## 2014-01-08 NOTE — Progress Notes (Signed)
Foley removed. Pt tolerated procedure well. Pt ambulated in halls. Was educated on back precautions and back brace. Did not experience nausea or dizziness while ambulating.

## 2014-01-08 NOTE — Progress Notes (Signed)
Removed hemovacc per MD order.  Patient tolerated well.  Will continue to monitor patient.

## 2014-01-08 NOTE — Progress Notes (Signed)
Patient [place] home via car with wife.  DC instructions and prescription given to patient.  Both fully understood. Vital signs and assessments were stable prior to discharge.

## 2014-01-08 NOTE — Evaluation (Signed)
Physical Therapy Evaluation Patient Details Name: Scott Koch MRN: [id number] DOB: 1955-07-24 Today's Date: 01/08/2014   History of Present Illness  58 yo male s/p L4-5 PLIF  PMH: CAD, COPD, HTN, Murmure, DM2, ACDF 2013, obesity. dyspnea, asthma, hypothryoidism    Clinical Impression  Patient presents with functional limitations due to deficits listed in PT problem list (see below). Pt with generalized weakness and pain limiting safe mobility. Pt tolerated stair negotiation with Min A for balance. Education provided on back precautions. Pt would benefit from skilled PT to improve transfers, gait, balance and mobility so pt can maximize independence and return to PLOF.    Follow Up Recommendations No PT follow up;Supervision - Intermittent    Equipment Recommendations  None recommended by PT    Recommendations for Other Services       Precautions / Restrictions Precautions Precautions: Back Precaution Booklet Issued: Yes (comment) Precaution Comments: handout provided and reviewed in detail. Educated on back precautions. Required Braces or Orthoses: Spinal Brace Spinal Brace: Lumbar corset;Applied in sitting position Restrictions Weight Bearing Restrictions: No      Mobility  Bed Mobility Overal bed mobility: Needs Assistance Bed Mobility: Rolling;Sidelying to Sit;Sit to Sidelying Rolling: Supervision Sidelying to sit: Min guard     Sit to sidelying: Min guard General bed mobility comments: HOB flat, no use of rails to simulate home environment. VC's for log roll technique.  Transfers Overall transfer level: Needs assistance Equipment used: Rolling walker (2 wheeled) Transfers: Sit to/from Stand Sit to Stand: Min guard         General transfer comment: Min guard for safety/balance. Increased time.   Ambulation/Gait Ambulation/Gait assistance: Min guard Ambulation Distance (Feet): 150 Feet (x2 bouts) Assistive device: Rolling walker (2 wheeled) Gait  Pattern/deviations: Step-through pattern;Decreased stride length   Gait velocity interpretation: Below normal speed for age/gender General Gait Details: Pt with slow, steady gait when using RW with a few short standing rest breaks due to fatigue.  Stairs Stairs: Yes Stairs assistance: Min assist Stair Management: One rail Left Number of Stairs: 2 (x2 bouts) General stair comments: Pt using hand rail on left and wife/therapist's hand on right for support to simulate home stairs.  Wheelchair Mobility    Modified Rankin (Stroke Patients Only)       Balance Overall balance assessment: Needs assistance Sitting-balance support: Feet supported;No upper extremity supported Sitting balance-Leahy Scale: Good     Standing balance support: During functional activity Standing balance-Leahy Scale: Fair Standing balance comment: Able to perform static standing without UE support for short periods and dynamic standing - readjusting LSO brace in standing without LOB.                              Pertinent Vitals/Pain Pain Assessment: 0-10 Pain Score: 4  Pain Location: back at surgical site Pain Descriptors / Indicators: Sore Pain Intervention(s): Monitored during session;Repositioned;Premedicated before session    [place] expects to be discharged to:: Private residence Living Arrangements: Spouse/significant other;Parent Available Help at Discharge: Family;Available 24 hours/day Type of Home: House Home Access: Stairs to enter Entrance Stairs-Rails: None Entrance Stairs-Number of Steps: 3 Home Layout: One level Home Equipment: Walker - standard;Cane - single point;Shower seat - built in Additional Comments: Wife will be home 24/7 for the first week. After she will return to work on night shift    Prior Function Level of Independence: Independent with assistive device(s)  Comments: Pt was using a single point cane due to incr pain. Likes to  garden and work in [employer], occupational   Dominant Hand: Right    Extremity/Trunk Assessment   Upper Extremity Assessment: Defer to OT evaluation;Overall WFL for tasks assessed           Lower Extremity Assessment: Generalized weakness      Cervical / Trunk Assessment: Normal  Communication   Communication: No difficulties  Cognition Arousal/Alertness: Awake/alert Behavior During Therapy: WFL for tasks assessed/performed Overall Cognitive Status: Within Functional Limits for tasks assessed       Memory: Decreased recall of precautions              General Comments      Exercises        Assessment/Plan    PT Assessment Patient needs continued PT services  PT Diagnosis Difficulty walking;Acute pain   PT Problem List Pain;Decreased strength;Decreased activity tolerance;Decreased balance;Decreased mobility;Decreased knowledge of precautions  PT Treatment Interventions Balance training;Gait training;Patient/family education;Functional mobility training;Therapeutic activities;Therapeutic exercise;Stair training;Neuromuscular re-education   PT Goals (Current goals can be found in the Care Plan section) Acute Rehab PT Goals Patient Stated Goal: to go home PT Goal Formulation: With patient Time For Goal Achievement: 01/22/14 Potential to Achieve Goals: Good    Frequency Min 5X/week   Barriers to discharge        Co-evaluation               End of Session Equipment Utilized During Treatment: Gait belt Activity Tolerance: Patient tolerated treatment well Patient left: in bed;with family/visitor present;with call bell/phone within reach;Other (comment) (sitting EOB with RT in room) Nurse Communication: Mobility status         Time: 6203-5597 PT Time Calculation (min) (ACUTE ONLY): 23 min   Charges:   PT Evaluation $Initial PT Evaluation Tier I: 1 Procedure PT Treatments $Gait Training: 8-22 mins   PT G CodesCandy Sledge A 01/08/2014, 10:18 AM Candy Sledge, PT, DPT [phone]

## 2014-01-08 NOTE — Progress Notes (Signed)
Patient ID: Scott Koch, male   DOB: 1955-11-15, 58 y.o.   MRN: [id number] Patient doing well no leg pain back pain well-controlled  Strength out of 5 wound clean dry and intact  Discharge home later today if voiding

## 2014-01-08 NOTE — Discharge Summary (Addendum)
Physician Discharge Summary  Patient ID: Scott Koch MRN: [id number] DOB/AGE: November 27, 1955 58 y.o.  Admit date: 01/07/2014 Discharge date: 01/08/2014  Admission Diagnoses: HNP, lumbar spinal stenosis   L4-5 Discharge Diagnoses: Same Active Problems:   HNP (herniated nucleus pulposus), lumbar   Discharged Condition: good  Hospital Course: Patient is [facility] underwent redo decompressive laminectomy and fusion L5-S1. Postoperatively patient did very well with recovered in the floor on the floor he was ambulating and had not voided yet but if he voids later on this morning we can discharge him home. He'll be scheduled follow-up 1-2 weeks. I'll discharge him oxycodone and cyclobenzaprine.  Consults: Significant Diagnostic Studies: Treatments: L4-5  decompression fusion Discharge Exam: Blood pressure 107/70, pulse 62, temperature 97.7 F (36.5 C), temperature source Oral, resp. rate 18, height 6' (1.829 m), weight 114.624 kg (252 lb 11.2 oz), SpO2 97 %. Strength 5 out of 5 wound clean dry and intact  Disposition: Home     Medication List    TAKE these medications        albuterol 108 (90 BASE) MCG/ACT inhaler  Commonly known as:  PROVENTIL HFA;VENTOLIN HFA  Inhale 2 puffs into the lungs every 6 (six) hours as needed. For shortness of breath     aspirin EC 81 MG tablet  Take 81 mg by mouth daily.     budesonide-formoterol 160-4.5 MCG/ACT inhaler  Commonly known as:  SYMBICORT  Inhale 2 puffs into the lungs 2 (two) times daily.     cyclobenzaprine 10 MG tablet  Commonly known as:  FLEXERIL  Take 1 tablet (10 mg total) by mouth 3 (three) times daily as needed for muscle spasms.     cyclobenzaprine 10 MG tablet  Commonly known as:  FLEXERIL  Take 1 tablet (10 mg total) by mouth 3 (three) times daily as needed for muscle spasms.     furosemide 40 MG tablet  Commonly known as:  LASIX  Take 40 mg by mouth daily.     HYDROcodone-acetaminophen 5-325 MG per tablet   Commonly known as:  NORCO/VICODIN  Take 1 tablet by mouth every 6 (six) hours as needed for moderate pain.     ipratropium-albuterol 0.5-2.5 (3) MG/3ML Soln  Commonly known as:  DUONEB  Take 3 mLs by nebulization every 6 (six) hours as needed (Shortness of breath).     levothyroxine 100 MCG tablet  Commonly known as:  SYNTHROID, LEVOTHROID  Take 100 mcg by mouth daily.     losartan 100 MG tablet  Commonly known as:  COZAAR  Take 100 mg by mouth daily.     metFORMIN 750 MG 24 hr tablet  Commonly known as:  GLUCOPHAGE-XR  Take 750 mg by mouth 2 (two) times daily.     montelukast 10 MG tablet  Commonly known as:  SINGULAIR  Take 10 mg by mouth at bedtime.     nicotine 21 mg/24hr patch  Commonly known as:  NICODERM CQ - dosed in mg/24 hours  Place 1 patch onto the skin daily.     oxyCODONE-acetaminophen 5-325 MG per tablet  Commonly known as:  PERCOCET/ROXICET  Take 1-2 tablets by mouth every 4 (four) hours as needed.     oxyCODONE-acetaminophen 5-325 MG per tablet  Commonly known as:  PERCOCET/ROXICET  Take 1-2 tablets by mouth every 4 (four) hours as needed for moderate pain.     potassium chloride 10 MEQ CR capsule  Commonly known as:  MICRO-K  Take 10 mEq by mouth daily.  rosuvastatin 10 MG tablet  Commonly known as:  CRESTOR  Take 10 mg by mouth daily.           Follow-up Information    Follow up with [facility], MD.   Specialty:  Neurosurgery   Contact information:   [address] [place] [address] [phone]       Signed: Shalaina Guardiola P 01/08/2014, 7:17 AM

## 2014-01-08 NOTE — Progress Notes (Signed)
Patient requested a rolling walker; [facility] called for walker to be delivered to room prior to discharge home todayAneta Mins [phone]

## 2014-01-09 LAB — GLUCOSE, CAPILLARY: Glucose-Capillary: 198 mg/dL — ABNORMAL HIGH (ref 70–99)

## 2014-09-04 ENCOUNTER — Other Ambulatory Visit: Payer: Self-pay | Admitting: Neurosurgery

## 2014-09-04 DIAGNOSIS — M5136 Other intervertebral disc degeneration, lumbar region: Secondary | ICD-10-CM

## 2014-09-12 ENCOUNTER — Ambulatory Visit
Admission: RE | Admit: 2014-09-12 | Discharge: 2014-09-12 | Disposition: A | Discharge status: Home / Self Care | Payer: BLUE CROSS/BLUE SHIELD | Source: Ambulatory Visit | Attending: Neurosurgery | Admitting: Neurosurgery

## 2014-09-12 DIAGNOSIS — M5136 Other intervertebral disc degeneration, lumbar region: Secondary | ICD-10-CM

## 2015-06-04 DIAGNOSIS — E1121 Type 2 diabetes mellitus with diabetic nephropathy: Secondary | ICD-10-CM | POA: Diagnosis not present

## 2015-06-04 DIAGNOSIS — E119 Type 2 diabetes mellitus without complications: Secondary | ICD-10-CM | POA: Diagnosis not present

## 2015-06-04 DIAGNOSIS — J449 Chronic obstructive pulmonary disease, unspecified: Secondary | ICD-10-CM | POA: Diagnosis not present

## 2015-06-04 DIAGNOSIS — I509 Heart failure, unspecified: Secondary | ICD-10-CM | POA: Diagnosis not present

## 2015-06-04 DIAGNOSIS — J45909 Unspecified asthma, uncomplicated: Secondary | ICD-10-CM | POA: Diagnosis not present

## 2015-06-04 DIAGNOSIS — Z6832 Body mass index (BMI) 32.0-32.9, adult: Secondary | ICD-10-CM | POA: Diagnosis not present

## 2015-06-04 DIAGNOSIS — E038 Other specified hypothyroidism: Secondary | ICD-10-CM | POA: Diagnosis not present

## 2015-06-04 DIAGNOSIS — E785 Hyperlipidemia, unspecified: Secondary | ICD-10-CM | POA: Diagnosis not present

## 2015-06-20 DIAGNOSIS — J449 Chronic obstructive pulmonary disease, unspecified: Secondary | ICD-10-CM | POA: Diagnosis not present

## 2015-06-20 DIAGNOSIS — J189 Pneumonia, unspecified organism: Secondary | ICD-10-CM | POA: Diagnosis not present

## 2015-06-20 DIAGNOSIS — Z6832 Body mass index (BMI) 32.0-32.9, adult: Secondary | ICD-10-CM | POA: Diagnosis not present

## 2015-06-20 DIAGNOSIS — J301 Allergic rhinitis due to pollen: Secondary | ICD-10-CM | POA: Diagnosis not present

## 2015-06-20 DIAGNOSIS — J4522 Mild intermittent asthma with status asthmaticus: Secondary | ICD-10-CM | POA: Diagnosis not present

## 2015-06-29 DIAGNOSIS — K0520 Aggressive periodontitis, unspecified: Secondary | ICD-10-CM | POA: Diagnosis not present

## 2015-08-20 DIAGNOSIS — R51 Headache: Secondary | ICD-10-CM | POA: Diagnosis not present

## 2015-08-20 DIAGNOSIS — R42 Dizziness and giddiness: Secondary | ICD-10-CM | POA: Diagnosis not present

## 2015-09-22 DIAGNOSIS — E038 Other specified hypothyroidism: Secondary | ICD-10-CM | POA: Diagnosis not present

## 2015-09-22 DIAGNOSIS — E1121 Type 2 diabetes mellitus with diabetic nephropathy: Secondary | ICD-10-CM | POA: Diagnosis not present

## 2015-09-22 DIAGNOSIS — L989 Disorder of the skin and subcutaneous tissue, unspecified: Secondary | ICD-10-CM | POA: Diagnosis not present

## 2015-09-22 DIAGNOSIS — R938 Abnormal findings on diagnostic imaging of other specified body structures: Secondary | ICD-10-CM | POA: Diagnosis not present

## 2015-09-22 DIAGNOSIS — Z6833 Body mass index (BMI) 33.0-33.9, adult: Secondary | ICD-10-CM | POA: Diagnosis not present

## 2015-09-22 DIAGNOSIS — E119 Type 2 diabetes mellitus without complications: Secondary | ICD-10-CM | POA: Diagnosis not present

## 2015-09-22 DIAGNOSIS — Z125 Encounter for screening for malignant neoplasm of prostate: Secondary | ICD-10-CM | POA: Diagnosis not present

## 2015-09-22 DIAGNOSIS — I509 Heart failure, unspecified: Secondary | ICD-10-CM | POA: Diagnosis not present

## 2015-09-22 DIAGNOSIS — E785 Hyperlipidemia, unspecified: Secondary | ICD-10-CM | POA: Diagnosis not present

## 2015-09-22 DIAGNOSIS — J45909 Unspecified asthma, uncomplicated: Secondary | ICD-10-CM | POA: Diagnosis not present

## 2015-09-22 DIAGNOSIS — Z0000 Encounter for general adult medical examination without abnormal findings: Secondary | ICD-10-CM | POA: Diagnosis not present

## 2015-11-07 DIAGNOSIS — Z23 Encounter for immunization: Secondary | ICD-10-CM | POA: Diagnosis not present

## 2015-11-09 IMAGING — CR DG CHEST 2V
2 series · 2 of 2 positions shown · non-contrast
Comparison: 08/23/2013

CLINICAL DATA: Preoperative evaluation for lumbar spine surgery,
history of COPD

EXAM:
CHEST  2 VIEW

[w chest pa]
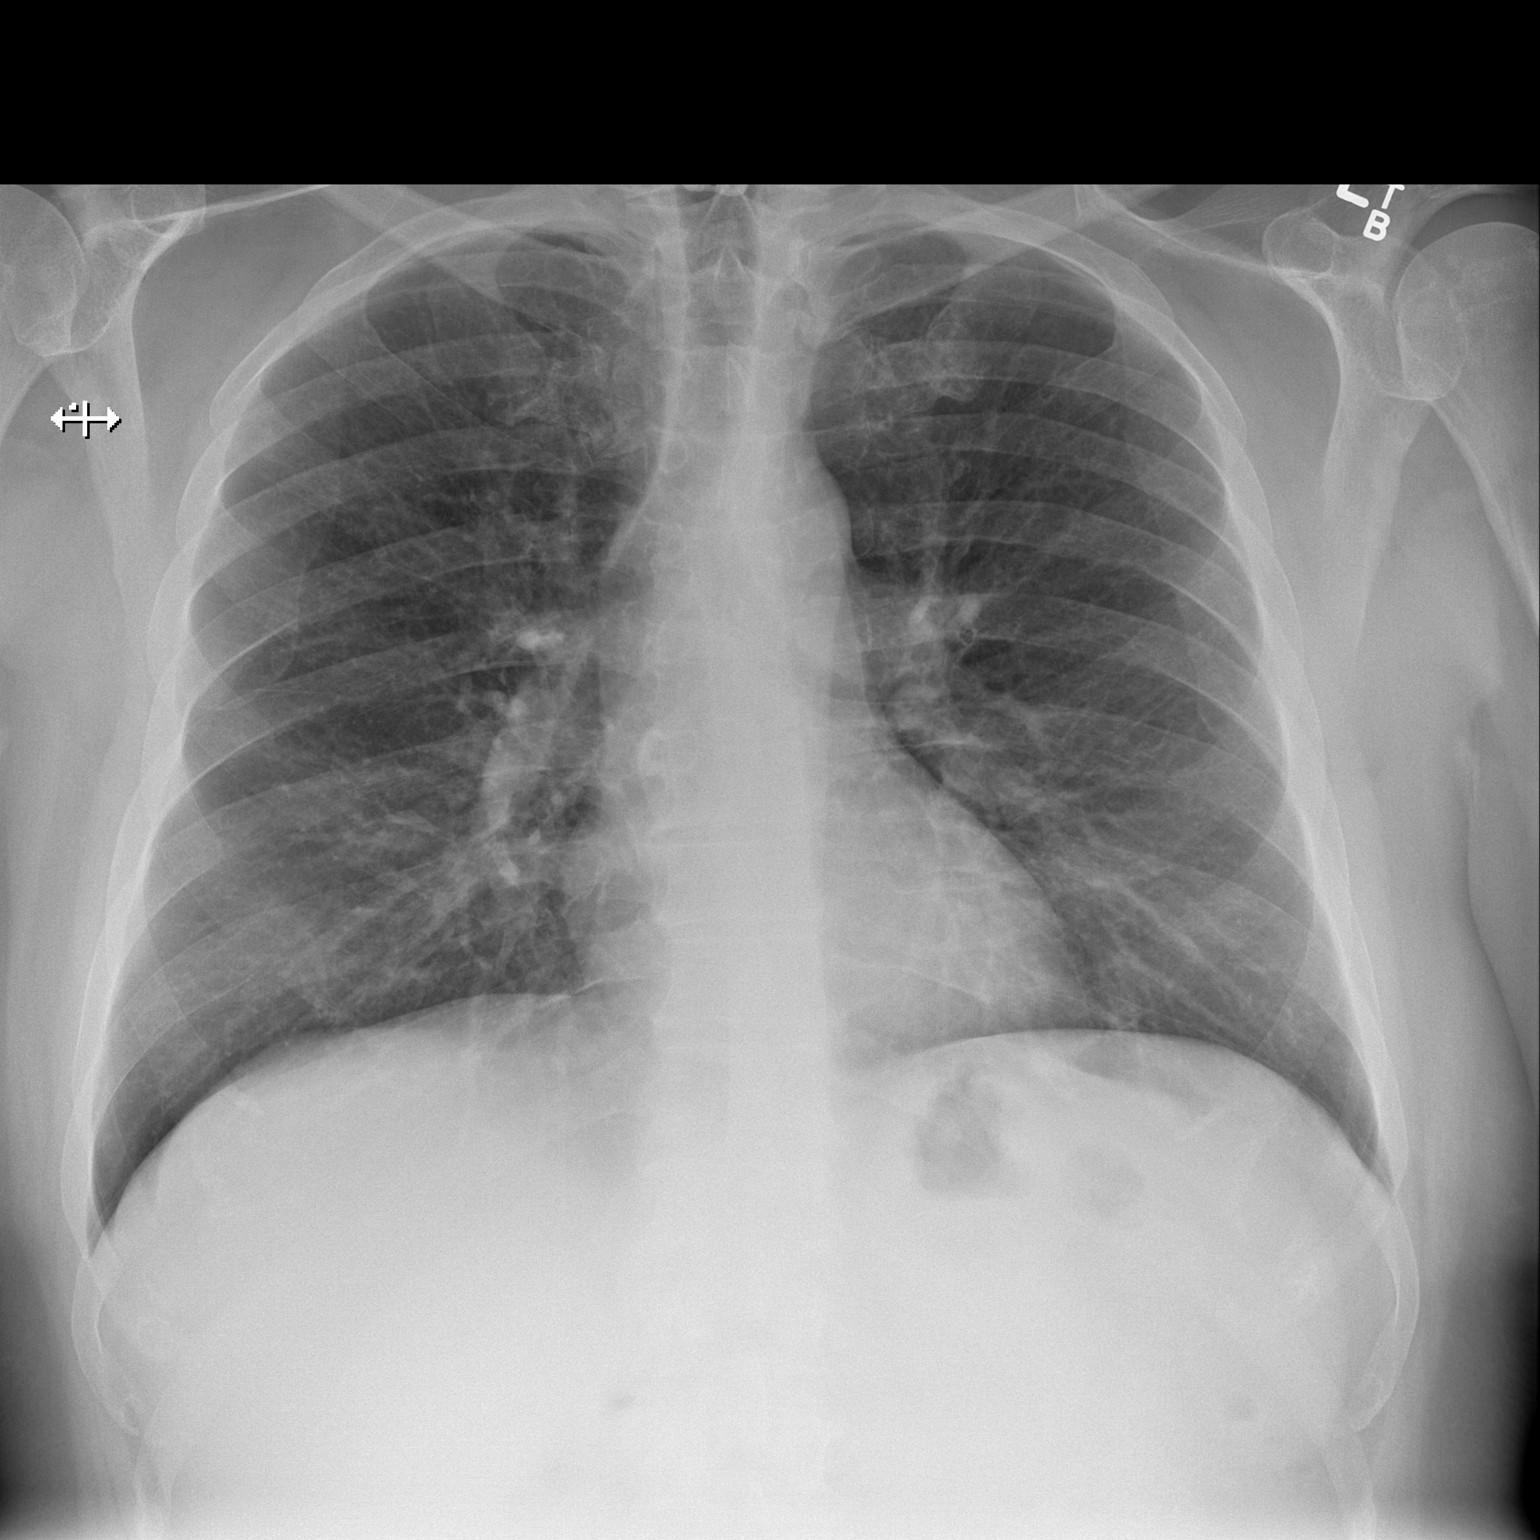

[w chest lat]
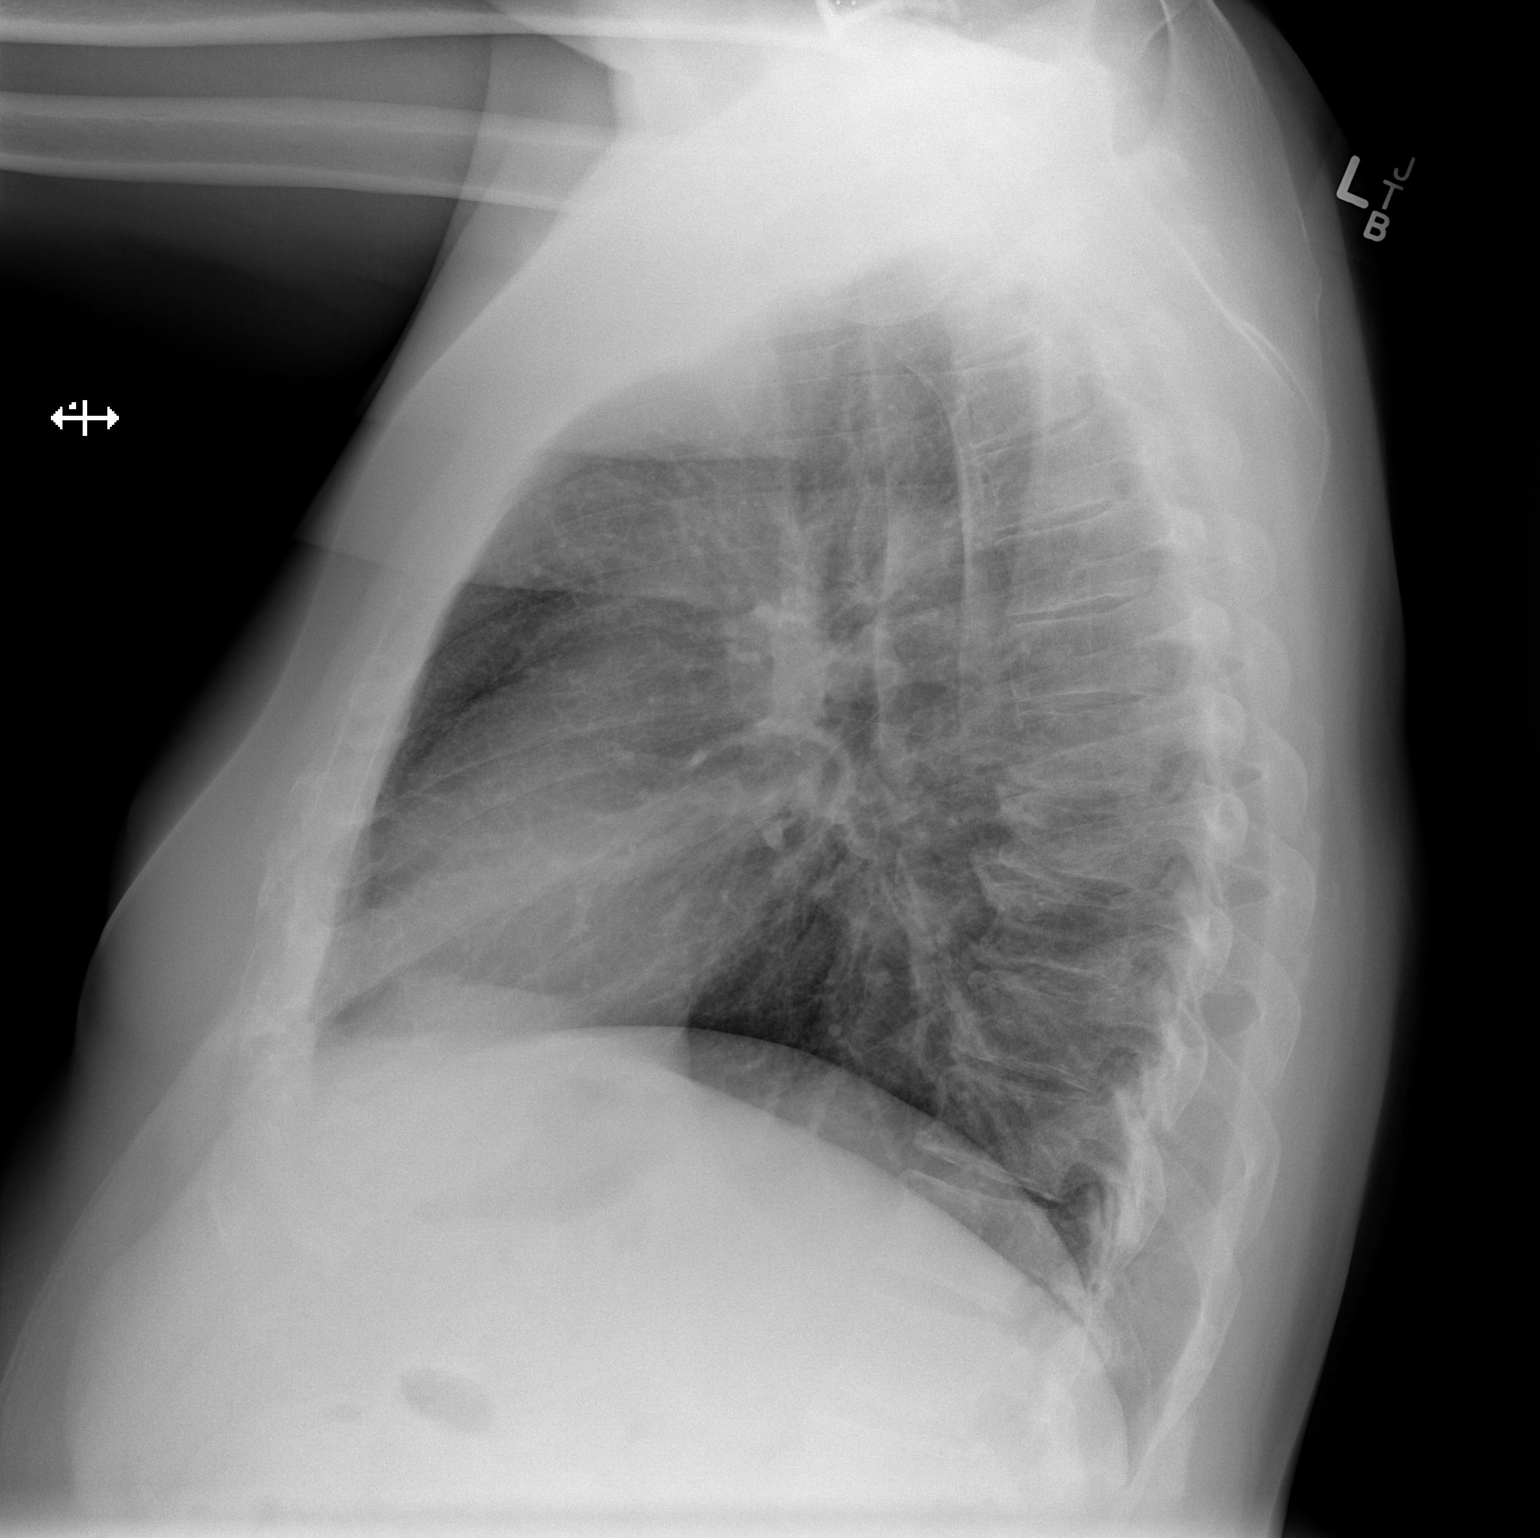

[2 of 2 positions shown; findings below may reference images not displayed]

FINDINGS: The heart size and mediastinal contours are within normal limits.
Both lungs are clear. The visualized skeletal structures are
unremarkable. Postsurgical changes are noted in the cervical spine.
IMPRESSION: No active cardiopulmonary disease.

## 2015-11-10 DIAGNOSIS — L920 Granuloma annulare: Secondary | ICD-10-CM | POA: Diagnosis not present

## 2015-11-14 IMAGING — RF DG C-ARM 61-120 MIN
1 series · 2 of 2 positions shown · non-contrast
Comparison: None

CLINICAL DATA: Posterior lumbar fusion

EXAM:
DG C-ARM 61-120 MIN; LUMBAR SPINE - 2-3 VIEW

[Series 1: run · 2 of 2 slices shown]
[im 1/2]
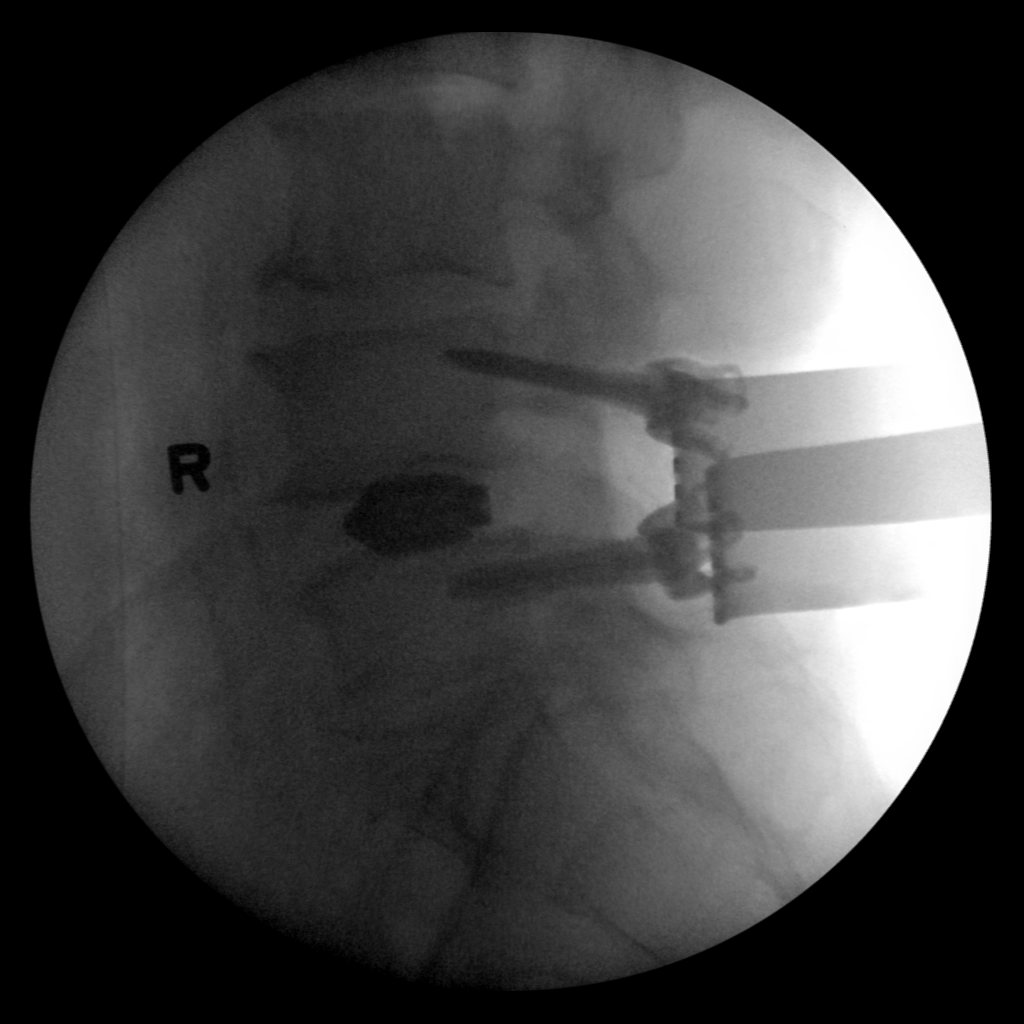
[im 2/2]
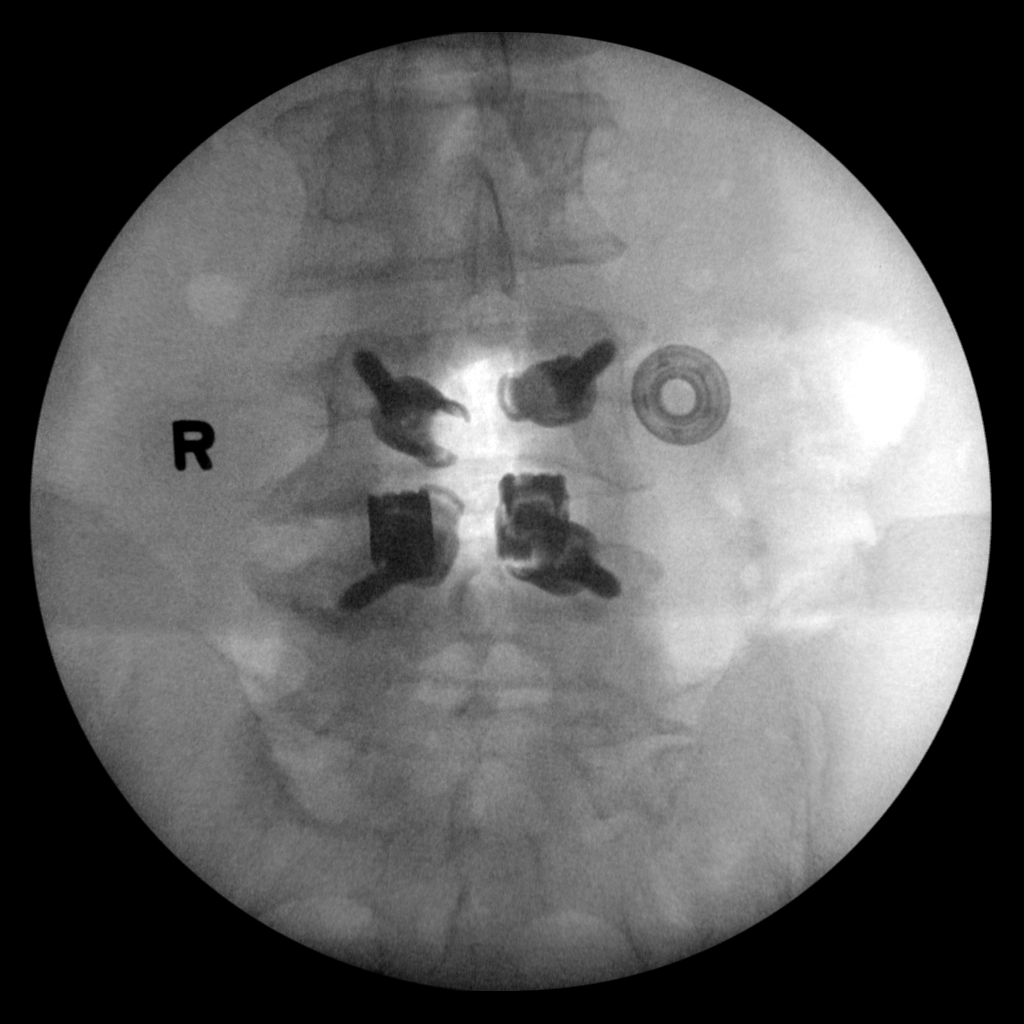

[2 of 2 positions shown; findings below may reference images not displayed]

FINDINGS: There is evidence of pedicle screw placement at L4 and L5
bilaterally. The pedicle screws and disc spacer at L4-5 appear
intact. No fracture or spondylolisthesis.
IMPRESSION: Screws and disc spacer appear intact. No fracture or
spondylolisthesis.

## 2015-12-23 DIAGNOSIS — Z9181 History of falling: Secondary | ICD-10-CM | POA: Diagnosis not present

## 2015-12-23 DIAGNOSIS — E038 Other specified hypothyroidism: Secondary | ICD-10-CM | POA: Diagnosis not present

## 2015-12-23 DIAGNOSIS — I50810 Right heart failure, unspecified: Secondary | ICD-10-CM | POA: Diagnosis not present

## 2015-12-23 DIAGNOSIS — J45909 Unspecified asthma, uncomplicated: Secondary | ICD-10-CM | POA: Diagnosis not present

## 2015-12-23 DIAGNOSIS — E785 Hyperlipidemia, unspecified: Secondary | ICD-10-CM | POA: Diagnosis not present

## 2015-12-23 DIAGNOSIS — E1121 Type 2 diabetes mellitus with diabetic nephropathy: Secondary | ICD-10-CM | POA: Diagnosis not present

## 2015-12-23 DIAGNOSIS — Z1389 Encounter for screening for other disorder: Secondary | ICD-10-CM | POA: Diagnosis not present

## 2015-12-23 DIAGNOSIS — J301 Allergic rhinitis due to pollen: Secondary | ICD-10-CM | POA: Diagnosis not present

## 2015-12-23 DIAGNOSIS — J449 Chronic obstructive pulmonary disease, unspecified: Secondary | ICD-10-CM | POA: Diagnosis not present

## 2015-12-23 DIAGNOSIS — Z6833 Body mass index (BMI) 33.0-33.9, adult: Secondary | ICD-10-CM | POA: Diagnosis not present

## 2015-12-23 DIAGNOSIS — R911 Solitary pulmonary nodule: Secondary | ICD-10-CM | POA: Diagnosis not present

## 2015-12-23 DIAGNOSIS — E119 Type 2 diabetes mellitus without complications: Secondary | ICD-10-CM | POA: Diagnosis not present

## 2016-01-12 DIAGNOSIS — I2510 Atherosclerotic heart disease of native coronary artery without angina pectoris: Secondary | ICD-10-CM | POA: Diagnosis not present

## 2016-01-12 DIAGNOSIS — I700 Atherosclerosis of aorta: Secondary | ICD-10-CM | POA: Diagnosis not present

## 2016-01-12 DIAGNOSIS — R938 Abnormal findings on diagnostic imaging of other specified body structures: Secondary | ICD-10-CM | POA: Diagnosis not present

## 2016-01-12 DIAGNOSIS — J439 Emphysema, unspecified: Secondary | ICD-10-CM | POA: Diagnosis not present

## 2016-01-12 DIAGNOSIS — R911 Solitary pulmonary nodule: Secondary | ICD-10-CM | POA: Diagnosis not present

## 2016-01-13 DIAGNOSIS — J301 Allergic rhinitis due to pollen: Secondary | ICD-10-CM | POA: Diagnosis not present

## 2016-01-13 DIAGNOSIS — E063 Autoimmune thyroiditis: Secondary | ICD-10-CM | POA: Diagnosis not present

## 2016-01-13 DIAGNOSIS — J189 Pneumonia, unspecified organism: Secondary | ICD-10-CM | POA: Diagnosis not present

## 2016-01-13 DIAGNOSIS — E119 Type 2 diabetes mellitus without complications: Secondary | ICD-10-CM | POA: Diagnosis not present

## 2016-01-13 DIAGNOSIS — I50810 Right heart failure, unspecified: Secondary | ICD-10-CM | POA: Diagnosis not present

## 2016-01-13 DIAGNOSIS — J4522 Mild intermittent asthma with status asthmaticus: Secondary | ICD-10-CM | POA: Diagnosis not present

## 2016-01-13 DIAGNOSIS — E1121 Type 2 diabetes mellitus with diabetic nephropathy: Secondary | ICD-10-CM | POA: Diagnosis not present

## 2016-01-13 DIAGNOSIS — E785 Hyperlipidemia, unspecified: Secondary | ICD-10-CM | POA: Diagnosis not present

## 2016-01-27 DIAGNOSIS — R05 Cough: Secondary | ICD-10-CM | POA: Diagnosis not present

## 2016-01-27 DIAGNOSIS — E785 Hyperlipidemia, unspecified: Secondary | ICD-10-CM | POA: Diagnosis not present

## 2016-01-27 DIAGNOSIS — E1121 Type 2 diabetes mellitus with diabetic nephropathy: Secondary | ICD-10-CM | POA: Diagnosis not present

## 2016-01-27 DIAGNOSIS — I50810 Right heart failure, unspecified: Secondary | ICD-10-CM | POA: Diagnosis not present

## 2016-01-27 DIAGNOSIS — R091 Pleurisy: Secondary | ICD-10-CM | POA: Diagnosis not present

## 2016-01-27 DIAGNOSIS — R079 Chest pain, unspecified: Secondary | ICD-10-CM | POA: Diagnosis not present

## 2016-01-27 DIAGNOSIS — E063 Autoimmune thyroiditis: Secondary | ICD-10-CM | POA: Diagnosis not present

## 2016-01-27 DIAGNOSIS — R0789 Other chest pain: Secondary | ICD-10-CM | POA: Diagnosis not present

## 2016-01-27 DIAGNOSIS — J301 Allergic rhinitis due to pollen: Secondary | ICD-10-CM | POA: Diagnosis not present

## 2016-01-27 DIAGNOSIS — R0781 Pleurodynia: Secondary | ICD-10-CM | POA: Diagnosis not present

## 2016-01-27 DIAGNOSIS — R109 Unspecified abdominal pain: Secondary | ICD-10-CM | POA: Diagnosis not present

## 2016-01-27 DIAGNOSIS — J449 Chronic obstructive pulmonary disease, unspecified: Secondary | ICD-10-CM | POA: Diagnosis not present

## 2016-01-27 DIAGNOSIS — R0602 Shortness of breath: Secondary | ICD-10-CM | POA: Diagnosis not present

## 2016-01-27 DIAGNOSIS — E119 Type 2 diabetes mellitus without complications: Secondary | ICD-10-CM | POA: Diagnosis not present

## 2016-01-27 DIAGNOSIS — Z6833 Body mass index (BMI) 33.0-33.9, adult: Secondary | ICD-10-CM | POA: Diagnosis not present

## 2016-03-24 DIAGNOSIS — E063 Autoimmune thyroiditis: Secondary | ICD-10-CM | POA: Diagnosis not present

## 2016-03-24 DIAGNOSIS — E119 Type 2 diabetes mellitus without complications: Secondary | ICD-10-CM | POA: Diagnosis not present

## 2016-03-24 DIAGNOSIS — L989 Disorder of the skin and subcutaneous tissue, unspecified: Secondary | ICD-10-CM | POA: Diagnosis not present

## 2016-03-24 DIAGNOSIS — E785 Hyperlipidemia, unspecified: Secondary | ICD-10-CM | POA: Diagnosis not present

## 2016-03-24 DIAGNOSIS — J449 Chronic obstructive pulmonary disease, unspecified: Secondary | ICD-10-CM | POA: Diagnosis not present

## 2016-03-24 DIAGNOSIS — E1121 Type 2 diabetes mellitus with diabetic nephropathy: Secondary | ICD-10-CM | POA: Diagnosis not present

## 2016-03-24 DIAGNOSIS — J301 Allergic rhinitis due to pollen: Secondary | ICD-10-CM | POA: Diagnosis not present

## 2016-03-24 DIAGNOSIS — I50810 Right heart failure, unspecified: Secondary | ICD-10-CM | POA: Diagnosis not present

## 2016-03-29 DIAGNOSIS — L920 Granuloma annulare: Secondary | ICD-10-CM | POA: Diagnosis not present

## 2016-03-29 DIAGNOSIS — L578 Other skin changes due to chronic exposure to nonionizing radiation: Secondary | ICD-10-CM | POA: Diagnosis not present

## 2016-04-05 DIAGNOSIS — Z6833 Body mass index (BMI) 33.0-33.9, adult: Secondary | ICD-10-CM | POA: Diagnosis not present

## 2016-04-05 DIAGNOSIS — M545 Low back pain: Secondary | ICD-10-CM | POA: Diagnosis not present

## 2016-04-05 DIAGNOSIS — E669 Obesity, unspecified: Secondary | ICD-10-CM | POA: Diagnosis not present

## 2016-04-05 DIAGNOSIS — G2581 Restless legs syndrome: Secondary | ICD-10-CM | POA: Diagnosis not present

## 2016-05-07 DIAGNOSIS — Z6832 Body mass index (BMI) 32.0-32.9, adult: Secondary | ICD-10-CM | POA: Diagnosis not present

## 2016-05-07 DIAGNOSIS — G2581 Restless legs syndrome: Secondary | ICD-10-CM | POA: Diagnosis not present

## 2016-05-07 DIAGNOSIS — E063 Autoimmune thyroiditis: Secondary | ICD-10-CM | POA: Diagnosis not present

## 2016-05-07 DIAGNOSIS — R42 Dizziness and giddiness: Secondary | ICD-10-CM | POA: Diagnosis not present

## 2016-05-07 DIAGNOSIS — E1121 Type 2 diabetes mellitus with diabetic nephropathy: Secondary | ICD-10-CM | POA: Diagnosis not present

## 2016-05-07 DIAGNOSIS — I50810 Right heart failure, unspecified: Secondary | ICD-10-CM | POA: Diagnosis not present

## 2016-05-07 DIAGNOSIS — J301 Allergic rhinitis due to pollen: Secondary | ICD-10-CM | POA: Diagnosis not present

## 2016-05-07 DIAGNOSIS — J45909 Unspecified asthma, uncomplicated: Secondary | ICD-10-CM | POA: Diagnosis not present

## 2016-05-07 DIAGNOSIS — E119 Type 2 diabetes mellitus without complications: Secondary | ICD-10-CM | POA: Diagnosis not present

## 2016-05-07 DIAGNOSIS — M545 Low back pain: Secondary | ICD-10-CM | POA: Diagnosis not present

## 2016-05-07 DIAGNOSIS — E785 Hyperlipidemia, unspecified: Secondary | ICD-10-CM | POA: Diagnosis not present

## 2016-05-07 DIAGNOSIS — G459 Transient cerebral ischemic attack, unspecified: Secondary | ICD-10-CM | POA: Diagnosis not present

## 2016-05-10 DIAGNOSIS — I6523 Occlusion and stenosis of bilateral carotid arteries: Secondary | ICD-10-CM | POA: Diagnosis not present

## 2016-05-10 DIAGNOSIS — G459 Transient cerebral ischemic attack, unspecified: Secondary | ICD-10-CM | POA: Diagnosis not present

## 2016-05-17 DIAGNOSIS — R42 Dizziness and giddiness: Secondary | ICD-10-CM | POA: Diagnosis not present

## 2016-05-17 DIAGNOSIS — Z6832 Body mass index (BMI) 32.0-32.9, adult: Secondary | ICD-10-CM | POA: Diagnosis not present

## 2016-05-17 DIAGNOSIS — I951 Orthostatic hypotension: Secondary | ICD-10-CM | POA: Diagnosis not present

## 2016-05-17 DIAGNOSIS — E1121 Type 2 diabetes mellitus with diabetic nephropathy: Secondary | ICD-10-CM | POA: Diagnosis not present

## 2016-06-08 DIAGNOSIS — E1121 Type 2 diabetes mellitus with diabetic nephropathy: Secondary | ICD-10-CM | POA: Diagnosis not present

## 2016-06-08 DIAGNOSIS — E785 Hyperlipidemia, unspecified: Secondary | ICD-10-CM | POA: Diagnosis not present

## 2016-06-08 DIAGNOSIS — E119 Type 2 diabetes mellitus without complications: Secondary | ICD-10-CM | POA: Diagnosis not present

## 2016-06-08 DIAGNOSIS — R42 Dizziness and giddiness: Secondary | ICD-10-CM | POA: Diagnosis not present

## 2016-06-08 DIAGNOSIS — E063 Autoimmune thyroiditis: Secondary | ICD-10-CM | POA: Diagnosis not present

## 2016-06-08 DIAGNOSIS — J449 Chronic obstructive pulmonary disease, unspecified: Secondary | ICD-10-CM | POA: Diagnosis not present

## 2016-06-08 DIAGNOSIS — G2581 Restless legs syndrome: Secondary | ICD-10-CM | POA: Diagnosis not present

## 2016-06-08 DIAGNOSIS — Z6832 Body mass index (BMI) 32.0-32.9, adult: Secondary | ICD-10-CM | POA: Diagnosis not present

## 2016-06-08 DIAGNOSIS — I50810 Right heart failure, unspecified: Secondary | ICD-10-CM | POA: Diagnosis not present

## 2016-06-08 DIAGNOSIS — J301 Allergic rhinitis due to pollen: Secondary | ICD-10-CM | POA: Diagnosis not present

## 2016-06-24 DIAGNOSIS — J449 Chronic obstructive pulmonary disease, unspecified: Secondary | ICD-10-CM | POA: Diagnosis not present

## 2016-06-24 DIAGNOSIS — J301 Allergic rhinitis due to pollen: Secondary | ICD-10-CM | POA: Diagnosis not present

## 2016-06-24 DIAGNOSIS — E1121 Type 2 diabetes mellitus with diabetic nephropathy: Secondary | ICD-10-CM | POA: Diagnosis not present

## 2016-06-24 DIAGNOSIS — G2581 Restless legs syndrome: Secondary | ICD-10-CM | POA: Diagnosis not present

## 2016-06-24 DIAGNOSIS — I50810 Right heart failure, unspecified: Secondary | ICD-10-CM | POA: Diagnosis not present

## 2016-06-24 DIAGNOSIS — E785 Hyperlipidemia, unspecified: Secondary | ICD-10-CM | POA: Diagnosis not present

## 2016-06-24 DIAGNOSIS — E669 Obesity, unspecified: Secondary | ICD-10-CM | POA: Diagnosis not present

## 2016-06-24 DIAGNOSIS — E063 Autoimmune thyroiditis: Secondary | ICD-10-CM | POA: Diagnosis not present

## 2016-06-24 DIAGNOSIS — Z6833 Body mass index (BMI) 33.0-33.9, adult: Secondary | ICD-10-CM | POA: Diagnosis not present

## 2016-06-24 DIAGNOSIS — Z716 Tobacco abuse counseling: Secondary | ICD-10-CM | POA: Diagnosis not present

## 2016-06-24 DIAGNOSIS — E119 Type 2 diabetes mellitus without complications: Secondary | ICD-10-CM | POA: Diagnosis not present

## 2016-06-26 DIAGNOSIS — T148XXA Other injury of unspecified body region, initial encounter: Secondary | ICD-10-CM | POA: Diagnosis not present

## 2016-06-26 DIAGNOSIS — R21 Rash and other nonspecific skin eruption: Secondary | ICD-10-CM | POA: Diagnosis not present

## 2016-07-06 DIAGNOSIS — R1013 Epigastric pain: Secondary | ICD-10-CM | POA: Diagnosis not present

## 2016-07-06 DIAGNOSIS — R079 Chest pain, unspecified: Secondary | ICD-10-CM | POA: Diagnosis not present

## 2016-07-06 DIAGNOSIS — M25572 Pain in left ankle and joints of left foot: Secondary | ICD-10-CM | POA: Diagnosis not present

## 2016-07-06 DIAGNOSIS — Z6832 Body mass index (BMI) 32.0-32.9, adult: Secondary | ICD-10-CM | POA: Diagnosis not present

## 2016-07-12 DIAGNOSIS — R109 Unspecified abdominal pain: Secondary | ICD-10-CM | POA: Diagnosis not present

## 2016-07-12 DIAGNOSIS — R1013 Epigastric pain: Secondary | ICD-10-CM | POA: Diagnosis not present

## 2016-07-24 DIAGNOSIS — J449 Chronic obstructive pulmonary disease, unspecified: Secondary | ICD-10-CM | POA: Diagnosis not present

## 2016-08-02 DIAGNOSIS — J189 Pneumonia, unspecified organism: Secondary | ICD-10-CM | POA: Diagnosis not present

## 2016-08-02 DIAGNOSIS — Z6832 Body mass index (BMI) 32.0-32.9, adult: Secondary | ICD-10-CM | POA: Diagnosis not present

## 2016-08-02 DIAGNOSIS — I50810 Right heart failure, unspecified: Secondary | ICD-10-CM | POA: Diagnosis not present

## 2016-08-02 DIAGNOSIS — J45902 Unspecified asthma with status asthmaticus: Secondary | ICD-10-CM | POA: Diagnosis not present

## 2016-08-02 DIAGNOSIS — R0602 Shortness of breath: Secondary | ICD-10-CM | POA: Diagnosis not present

## 2016-08-06 DIAGNOSIS — I50810 Right heart failure, unspecified: Secondary | ICD-10-CM | POA: Diagnosis not present

## 2016-08-06 DIAGNOSIS — E785 Hyperlipidemia, unspecified: Secondary | ICD-10-CM | POA: Diagnosis not present

## 2016-08-06 DIAGNOSIS — J45902 Unspecified asthma with status asthmaticus: Secondary | ICD-10-CM | POA: Diagnosis not present

## 2016-08-06 DIAGNOSIS — E063 Autoimmune thyroiditis: Secondary | ICD-10-CM | POA: Diagnosis not present

## 2016-08-06 DIAGNOSIS — Z716 Tobacco abuse counseling: Secondary | ICD-10-CM | POA: Diagnosis not present

## 2016-08-06 DIAGNOSIS — E119 Type 2 diabetes mellitus without complications: Secondary | ICD-10-CM | POA: Diagnosis not present

## 2016-08-06 DIAGNOSIS — J189 Pneumonia, unspecified organism: Secondary | ICD-10-CM | POA: Diagnosis not present

## 2016-08-06 DIAGNOSIS — B370 Candidal stomatitis: Secondary | ICD-10-CM | POA: Diagnosis not present

## 2016-08-06 DIAGNOSIS — Z6832 Body mass index (BMI) 32.0-32.9, adult: Secondary | ICD-10-CM | POA: Diagnosis not present

## 2016-08-06 DIAGNOSIS — E1121 Type 2 diabetes mellitus with diabetic nephropathy: Secondary | ICD-10-CM | POA: Diagnosis not present

## 2016-08-11 DIAGNOSIS — M25569 Pain in unspecified knee: Secondary | ICD-10-CM | POA: Diagnosis not present

## 2016-08-11 DIAGNOSIS — J189 Pneumonia, unspecified organism: Secondary | ICD-10-CM | POA: Diagnosis not present

## 2016-08-11 DIAGNOSIS — J449 Chronic obstructive pulmonary disease, unspecified: Secondary | ICD-10-CM | POA: Diagnosis not present

## 2016-08-11 DIAGNOSIS — Z6832 Body mass index (BMI) 32.0-32.9, adult: Secondary | ICD-10-CM | POA: Diagnosis not present

## 2016-08-11 DIAGNOSIS — J45902 Unspecified asthma with status asthmaticus: Secondary | ICD-10-CM | POA: Diagnosis not present

## 2016-08-11 DIAGNOSIS — I50810 Right heart failure, unspecified: Secondary | ICD-10-CM | POA: Diagnosis not present

## 2016-08-12 DIAGNOSIS — Z6832 Body mass index (BMI) 32.0-32.9, adult: Secondary | ICD-10-CM | POA: Diagnosis not present

## 2016-08-12 DIAGNOSIS — J189 Pneumonia, unspecified organism: Secondary | ICD-10-CM | POA: Diagnosis not present

## 2016-08-12 DIAGNOSIS — J45902 Unspecified asthma with status asthmaticus: Secondary | ICD-10-CM | POA: Diagnosis not present

## 2016-08-12 DIAGNOSIS — M25569 Pain in unspecified knee: Secondary | ICD-10-CM | POA: Diagnosis not present

## 2016-08-12 DIAGNOSIS — J449 Chronic obstructive pulmonary disease, unspecified: Secondary | ICD-10-CM | POA: Diagnosis not present

## 2016-08-12 DIAGNOSIS — I50810 Right heart failure, unspecified: Secondary | ICD-10-CM | POA: Diagnosis not present

## 2016-08-26 DIAGNOSIS — Z6831 Body mass index (BMI) 31.0-31.9, adult: Secondary | ICD-10-CM | POA: Diagnosis not present

## 2016-08-26 DIAGNOSIS — J189 Pneumonia, unspecified organism: Secondary | ICD-10-CM | POA: Diagnosis not present

## 2016-09-24 DIAGNOSIS — Z0000 Encounter for general adult medical examination without abnormal findings: Secondary | ICD-10-CM | POA: Diagnosis not present

## 2016-09-24 DIAGNOSIS — Z716 Tobacco abuse counseling: Secondary | ICD-10-CM | POA: Diagnosis not present

## 2016-09-24 DIAGNOSIS — Z6832 Body mass index (BMI) 32.0-32.9, adult: Secondary | ICD-10-CM | POA: Diagnosis not present

## 2016-10-12 DIAGNOSIS — Z6831 Body mass index (BMI) 31.0-31.9, adult: Secondary | ICD-10-CM | POA: Diagnosis not present

## 2016-10-12 DIAGNOSIS — E1165 Type 2 diabetes mellitus with hyperglycemia: Secondary | ICD-10-CM | POA: Diagnosis not present

## 2016-10-20 DIAGNOSIS — E119 Type 2 diabetes mellitus without complications: Secondary | ICD-10-CM | POA: Diagnosis not present

## 2016-10-27 DIAGNOSIS — Z23 Encounter for immunization: Secondary | ICD-10-CM | POA: Diagnosis not present

## 2016-11-03 DIAGNOSIS — Z6830 Body mass index (BMI) 30.0-30.9, adult: Secondary | ICD-10-CM | POA: Diagnosis not present

## 2016-11-03 DIAGNOSIS — Z713 Dietary counseling and surveillance: Secondary | ICD-10-CM | POA: Diagnosis not present

## 2016-11-03 DIAGNOSIS — E119 Type 2 diabetes mellitus without complications: Secondary | ICD-10-CM | POA: Diagnosis not present

## 2016-12-06 DIAGNOSIS — J449 Chronic obstructive pulmonary disease, unspecified: Secondary | ICD-10-CM | POA: Diagnosis not present

## 2016-12-06 DIAGNOSIS — E785 Hyperlipidemia, unspecified: Secondary | ICD-10-CM | POA: Diagnosis not present

## 2016-12-06 DIAGNOSIS — M545 Low back pain: Secondary | ICD-10-CM | POA: Diagnosis not present

## 2016-12-06 DIAGNOSIS — I50810 Right heart failure, unspecified: Secondary | ICD-10-CM | POA: Diagnosis not present

## 2016-12-06 DIAGNOSIS — G2581 Restless legs syndrome: Secondary | ICD-10-CM | POA: Diagnosis not present

## 2016-12-06 DIAGNOSIS — Z716 Tobacco abuse counseling: Secondary | ICD-10-CM | POA: Diagnosis not present

## 2016-12-06 DIAGNOSIS — E119 Type 2 diabetes mellitus without complications: Secondary | ICD-10-CM | POA: Diagnosis not present

## 2016-12-27 DIAGNOSIS — E785 Hyperlipidemia, unspecified: Secondary | ICD-10-CM | POA: Diagnosis not present

## 2016-12-27 DIAGNOSIS — Z6829 Body mass index (BMI) 29.0-29.9, adult: Secondary | ICD-10-CM | POA: Diagnosis not present

## 2016-12-27 DIAGNOSIS — E063 Autoimmune thyroiditis: Secondary | ICD-10-CM | POA: Diagnosis not present

## 2016-12-27 DIAGNOSIS — E1121 Type 2 diabetes mellitus with diabetic nephropathy: Secondary | ICD-10-CM | POA: Diagnosis not present

## 2016-12-27 DIAGNOSIS — J449 Chronic obstructive pulmonary disease, unspecified: Secondary | ICD-10-CM | POA: Diagnosis not present

## 2016-12-27 DIAGNOSIS — J189 Pneumonia, unspecified organism: Secondary | ICD-10-CM | POA: Diagnosis not present

## 2016-12-27 DIAGNOSIS — E119 Type 2 diabetes mellitus without complications: Secondary | ICD-10-CM | POA: Diagnosis not present

## 2017-01-20 DIAGNOSIS — R918 Other nonspecific abnormal finding of lung field: Secondary | ICD-10-CM | POA: Diagnosis not present

## 2017-01-20 DIAGNOSIS — R9389 Abnormal findings on diagnostic imaging of other specified body structures: Secondary | ICD-10-CM | POA: Diagnosis not present

## 2017-01-20 DIAGNOSIS — R911 Solitary pulmonary nodule: Secondary | ICD-10-CM | POA: Diagnosis not present

## 2017-03-01 DIAGNOSIS — Z6829 Body mass index (BMI) 29.0-29.9, adult: Secondary | ICD-10-CM | POA: Diagnosis not present

## 2017-03-01 DIAGNOSIS — J301 Allergic rhinitis due to pollen: Secondary | ICD-10-CM | POA: Diagnosis not present

## 2017-03-01 DIAGNOSIS — J189 Pneumonia, unspecified organism: Secondary | ICD-10-CM | POA: Diagnosis not present

## 2017-03-01 DIAGNOSIS — J029 Acute pharyngitis, unspecified: Secondary | ICD-10-CM | POA: Diagnosis not present

## 2017-03-01 DIAGNOSIS — J449 Chronic obstructive pulmonary disease, unspecified: Secondary | ICD-10-CM | POA: Diagnosis not present

## 2017-03-28 DIAGNOSIS — E119 Type 2 diabetes mellitus without complications: Secondary | ICD-10-CM | POA: Diagnosis not present

## 2017-03-28 DIAGNOSIS — E785 Hyperlipidemia, unspecified: Secondary | ICD-10-CM | POA: Diagnosis not present

## 2017-03-28 DIAGNOSIS — E1121 Type 2 diabetes mellitus with diabetic nephropathy: Secondary | ICD-10-CM | POA: Diagnosis not present

## 2017-03-28 DIAGNOSIS — J301 Allergic rhinitis due to pollen: Secondary | ICD-10-CM | POA: Diagnosis not present

## 2017-03-28 DIAGNOSIS — Z9181 History of falling: Secondary | ICD-10-CM | POA: Diagnosis not present

## 2017-03-28 DIAGNOSIS — Z1331 Encounter for screening for depression: Secondary | ICD-10-CM | POA: Diagnosis not present

## 2017-03-28 DIAGNOSIS — E063 Autoimmune thyroiditis: Secondary | ICD-10-CM | POA: Diagnosis not present

## 2017-04-01 DIAGNOSIS — J449 Chronic obstructive pulmonary disease, unspecified: Secondary | ICD-10-CM | POA: Diagnosis not present

## 2017-04-01 DIAGNOSIS — J189 Pneumonia, unspecified organism: Secondary | ICD-10-CM | POA: Diagnosis not present

## 2017-04-01 DIAGNOSIS — Z6830 Body mass index (BMI) 30.0-30.9, adult: Secondary | ICD-10-CM | POA: Diagnosis not present

## 2017-04-01 DIAGNOSIS — R6889 Other general symptoms and signs: Secondary | ICD-10-CM | POA: Diagnosis not present

## 2017-06-02 DIAGNOSIS — E785 Hyperlipidemia, unspecified: Secondary | ICD-10-CM | POA: Diagnosis not present

## 2017-06-02 DIAGNOSIS — Z6831 Body mass index (BMI) 31.0-31.9, adult: Secondary | ICD-10-CM | POA: Diagnosis not present

## 2017-06-02 DIAGNOSIS — E118 Type 2 diabetes mellitus with unspecified complications: Secondary | ICD-10-CM | POA: Diagnosis not present

## 2017-06-02 DIAGNOSIS — J301 Allergic rhinitis due to pollen: Secondary | ICD-10-CM | POA: Diagnosis not present

## 2017-06-02 DIAGNOSIS — I50810 Right heart failure, unspecified: Secondary | ICD-10-CM | POA: Diagnosis not present

## 2017-06-02 DIAGNOSIS — E1121 Type 2 diabetes mellitus with diabetic nephropathy: Secondary | ICD-10-CM | POA: Diagnosis not present

## 2017-06-02 DIAGNOSIS — E063 Autoimmune thyroiditis: Secondary | ICD-10-CM | POA: Diagnosis not present

## 2017-06-22 DIAGNOSIS — Z6831 Body mass index (BMI) 31.0-31.9, adult: Secondary | ICD-10-CM | POA: Diagnosis not present

## 2017-06-22 DIAGNOSIS — R42 Dizziness and giddiness: Secondary | ICD-10-CM | POA: Diagnosis not present

## 2017-07-04 DIAGNOSIS — Z1339 Encounter for screening examination for other mental health and behavioral disorders: Secondary | ICD-10-CM | POA: Diagnosis not present

## 2017-07-04 DIAGNOSIS — Z6831 Body mass index (BMI) 31.0-31.9, adult: Secondary | ICD-10-CM | POA: Diagnosis not present

## 2017-07-04 DIAGNOSIS — J301 Allergic rhinitis due to pollen: Secondary | ICD-10-CM | POA: Diagnosis not present

## 2017-07-04 DIAGNOSIS — E785 Hyperlipidemia, unspecified: Secondary | ICD-10-CM | POA: Diagnosis not present

## 2017-07-04 DIAGNOSIS — G459 Transient cerebral ischemic attack, unspecified: Secondary | ICD-10-CM | POA: Diagnosis not present

## 2017-07-04 DIAGNOSIS — E118 Type 2 diabetes mellitus with unspecified complications: Secondary | ICD-10-CM | POA: Diagnosis not present

## 2017-07-04 DIAGNOSIS — I50810 Right heart failure, unspecified: Secondary | ICD-10-CM | POA: Diagnosis not present

## 2017-07-04 DIAGNOSIS — E1121 Type 2 diabetes mellitus with diabetic nephropathy: Secondary | ICD-10-CM | POA: Diagnosis not present

## 2017-07-04 DIAGNOSIS — E063 Autoimmune thyroiditis: Secondary | ICD-10-CM | POA: Diagnosis not present

## 2017-10-06 DIAGNOSIS — E669 Obesity, unspecified: Secondary | ICD-10-CM | POA: Diagnosis not present

## 2017-10-06 DIAGNOSIS — E785 Hyperlipidemia, unspecified: Secondary | ICD-10-CM | POA: Diagnosis not present

## 2017-10-06 DIAGNOSIS — Z6830 Body mass index (BMI) 30.0-30.9, adult: Secondary | ICD-10-CM | POA: Diagnosis not present

## 2017-10-06 DIAGNOSIS — Z0000 Encounter for general adult medical examination without abnormal findings: Secondary | ICD-10-CM | POA: Diagnosis not present

## 2017-10-06 DIAGNOSIS — Z23 Encounter for immunization: Secondary | ICD-10-CM | POA: Diagnosis not present

## 2017-10-12 DIAGNOSIS — L308 Other specified dermatitis: Secondary | ICD-10-CM | POA: Diagnosis not present

## 2017-10-12 DIAGNOSIS — D485 Neoplasm of uncertain behavior of skin: Secondary | ICD-10-CM | POA: Diagnosis not present

## 2017-10-26 DIAGNOSIS — L309 Dermatitis, unspecified: Secondary | ICD-10-CM | POA: Diagnosis not present

## 2017-10-26 DIAGNOSIS — R531 Weakness: Secondary | ICD-10-CM | POA: Diagnosis not present

## 2017-10-26 DIAGNOSIS — L299 Pruritus, unspecified: Secondary | ICD-10-CM | POA: Diagnosis not present

## 2017-11-10 DIAGNOSIS — J4522 Mild intermittent asthma with status asthmaticus: Secondary | ICD-10-CM | POA: Diagnosis not present

## 2017-11-10 DIAGNOSIS — J301 Allergic rhinitis due to pollen: Secondary | ICD-10-CM | POA: Diagnosis not present

## 2017-11-10 DIAGNOSIS — J189 Pneumonia, unspecified organism: Secondary | ICD-10-CM | POA: Diagnosis not present

## 2017-11-10 DIAGNOSIS — Z6830 Body mass index (BMI) 30.0-30.9, adult: Secondary | ICD-10-CM | POA: Diagnosis not present

## 2018-01-05 DIAGNOSIS — E118 Type 2 diabetes mellitus with unspecified complications: Secondary | ICD-10-CM | POA: Diagnosis not present

## 2018-01-05 DIAGNOSIS — J301 Allergic rhinitis due to pollen: Secondary | ICD-10-CM | POA: Diagnosis not present

## 2018-01-05 DIAGNOSIS — E785 Hyperlipidemia, unspecified: Secondary | ICD-10-CM | POA: Diagnosis not present

## 2018-01-05 DIAGNOSIS — I50810 Right heart failure, unspecified: Secondary | ICD-10-CM | POA: Diagnosis not present

## 2018-01-05 DIAGNOSIS — Z6831 Body mass index (BMI) 31.0-31.9, adult: Secondary | ICD-10-CM | POA: Diagnosis not present

## 2018-01-05 DIAGNOSIS — E1121 Type 2 diabetes mellitus with diabetic nephropathy: Secondary | ICD-10-CM | POA: Diagnosis not present

## 2018-01-05 DIAGNOSIS — J449 Chronic obstructive pulmonary disease, unspecified: Secondary | ICD-10-CM | POA: Diagnosis not present

## 2018-01-05 DIAGNOSIS — E063 Autoimmune thyroiditis: Secondary | ICD-10-CM | POA: Diagnosis not present

## 2018-01-19 DIAGNOSIS — J449 Chronic obstructive pulmonary disease, unspecified: Secondary | ICD-10-CM | POA: Diagnosis not present

## 2018-01-19 DIAGNOSIS — Z6831 Body mass index (BMI) 31.0-31.9, adult: Secondary | ICD-10-CM | POA: Diagnosis not present

## 2018-01-19 DIAGNOSIS — J301 Allergic rhinitis due to pollen: Secondary | ICD-10-CM | POA: Diagnosis not present

## 2018-01-19 DIAGNOSIS — J4522 Mild intermittent asthma with status asthmaticus: Secondary | ICD-10-CM | POA: Diagnosis not present

## 2018-01-19 DIAGNOSIS — J189 Pneumonia, unspecified organism: Secondary | ICD-10-CM | POA: Diagnosis not present

## 2018-03-23 DIAGNOSIS — Z6831 Body mass index (BMI) 31.0-31.9, adult: Secondary | ICD-10-CM | POA: Diagnosis not present

## 2018-03-23 DIAGNOSIS — M5431 Sciatica, right side: Secondary | ICD-10-CM | POA: Diagnosis not present

## 2018-04-11 DIAGNOSIS — Z1331 Encounter for screening for depression: Secondary | ICD-10-CM | POA: Diagnosis not present

## 2018-04-11 DIAGNOSIS — J301 Allergic rhinitis due to pollen: Secondary | ICD-10-CM | POA: Diagnosis not present

## 2018-04-11 DIAGNOSIS — E118 Type 2 diabetes mellitus with unspecified complications: Secondary | ICD-10-CM | POA: Diagnosis not present

## 2018-04-11 DIAGNOSIS — J449 Chronic obstructive pulmonary disease, unspecified: Secondary | ICD-10-CM | POA: Diagnosis not present

## 2018-04-11 DIAGNOSIS — E1121 Type 2 diabetes mellitus with diabetic nephropathy: Secondary | ICD-10-CM | POA: Diagnosis not present

## 2018-04-11 DIAGNOSIS — G459 Transient cerebral ischemic attack, unspecified: Secondary | ICD-10-CM | POA: Diagnosis not present

## 2018-04-11 DIAGNOSIS — E063 Autoimmune thyroiditis: Secondary | ICD-10-CM | POA: Diagnosis not present

## 2018-04-11 DIAGNOSIS — E785 Hyperlipidemia, unspecified: Secondary | ICD-10-CM | POA: Diagnosis not present

## 2018-04-11 DIAGNOSIS — E559 Vitamin D deficiency, unspecified: Secondary | ICD-10-CM | POA: Diagnosis not present

## 2018-04-11 DIAGNOSIS — K219 Gastro-esophageal reflux disease without esophagitis: Secondary | ICD-10-CM | POA: Diagnosis not present

## 2018-04-11 DIAGNOSIS — I50810 Right heart failure, unspecified: Secondary | ICD-10-CM | POA: Diagnosis not present

## 2018-04-11 DIAGNOSIS — Z9181 History of falling: Secondary | ICD-10-CM | POA: Diagnosis not present

## 2018-05-19 DIAGNOSIS — E785 Hyperlipidemia, unspecified: Secondary | ICD-10-CM | POA: Diagnosis not present

## 2018-05-19 DIAGNOSIS — Z9181 History of falling: Secondary | ICD-10-CM | POA: Diagnosis not present

## 2018-05-19 DIAGNOSIS — Z139 Encounter for screening, unspecified: Secondary | ICD-10-CM | POA: Diagnosis not present

## 2018-05-19 DIAGNOSIS — Z1331 Encounter for screening for depression: Secondary | ICD-10-CM | POA: Diagnosis not present

## 2018-05-19 DIAGNOSIS — Z0000 Encounter for general adult medical examination without abnormal findings: Secondary | ICD-10-CM | POA: Diagnosis not present

## 2018-05-19 DIAGNOSIS — E669 Obesity, unspecified: Secondary | ICD-10-CM | POA: Diagnosis not present

## 2018-05-19 DIAGNOSIS — Z125 Encounter for screening for malignant neoplasm of prostate: Secondary | ICD-10-CM | POA: Diagnosis not present

## 2018-07-14 DIAGNOSIS — I50810 Right heart failure, unspecified: Secondary | ICD-10-CM | POA: Diagnosis not present

## 2018-07-14 DIAGNOSIS — E785 Hyperlipidemia, unspecified: Secondary | ICD-10-CM | POA: Diagnosis not present

## 2018-07-14 DIAGNOSIS — J449 Chronic obstructive pulmonary disease, unspecified: Secondary | ICD-10-CM | POA: Diagnosis not present

## 2018-07-14 DIAGNOSIS — E118 Type 2 diabetes mellitus with unspecified complications: Secondary | ICD-10-CM | POA: Diagnosis not present

## 2018-07-14 DIAGNOSIS — K219 Gastro-esophageal reflux disease without esophagitis: Secondary | ICD-10-CM | POA: Diagnosis not present

## 2018-07-14 DIAGNOSIS — E1121 Type 2 diabetes mellitus with diabetic nephropathy: Secondary | ICD-10-CM | POA: Diagnosis not present

## 2018-07-14 DIAGNOSIS — J301 Allergic rhinitis due to pollen: Secondary | ICD-10-CM | POA: Diagnosis not present

## 2018-07-14 DIAGNOSIS — E063 Autoimmune thyroiditis: Secondary | ICD-10-CM | POA: Diagnosis not present

## 2018-07-14 DIAGNOSIS — G459 Transient cerebral ischemic attack, unspecified: Secondary | ICD-10-CM | POA: Diagnosis not present

## 2018-07-14 DIAGNOSIS — E559 Vitamin D deficiency, unspecified: Secondary | ICD-10-CM | POA: Diagnosis not present

## 2018-10-10 DIAGNOSIS — I50810 Right heart failure, unspecified: Secondary | ICD-10-CM | POA: Diagnosis not present

## 2018-10-10 DIAGNOSIS — J301 Allergic rhinitis due to pollen: Secondary | ICD-10-CM | POA: Diagnosis not present

## 2018-10-10 DIAGNOSIS — E063 Autoimmune thyroiditis: Secondary | ICD-10-CM | POA: Diagnosis not present

## 2018-10-10 DIAGNOSIS — E1121 Type 2 diabetes mellitus with diabetic nephropathy: Secondary | ICD-10-CM | POA: Diagnosis not present

## 2018-10-10 DIAGNOSIS — E785 Hyperlipidemia, unspecified: Secondary | ICD-10-CM | POA: Diagnosis not present

## 2018-10-10 DIAGNOSIS — E118 Type 2 diabetes mellitus with unspecified complications: Secondary | ICD-10-CM | POA: Diagnosis not present

## 2018-10-10 DIAGNOSIS — K219 Gastro-esophageal reflux disease without esophagitis: Secondary | ICD-10-CM | POA: Diagnosis not present

## 2018-10-10 DIAGNOSIS — E559 Vitamin D deficiency, unspecified: Secondary | ICD-10-CM | POA: Diagnosis not present

## 2018-10-10 DIAGNOSIS — G459 Transient cerebral ischemic attack, unspecified: Secondary | ICD-10-CM | POA: Diagnosis not present

## 2018-10-10 DIAGNOSIS — J449 Chronic obstructive pulmonary disease, unspecified: Secondary | ICD-10-CM | POA: Diagnosis not present

## 2018-10-27 DIAGNOSIS — E785 Hyperlipidemia, unspecified: Secondary | ICD-10-CM | POA: Diagnosis not present

## 2018-10-27 DIAGNOSIS — E1121 Type 2 diabetes mellitus with diabetic nephropathy: Secondary | ICD-10-CM | POA: Diagnosis not present

## 2018-10-27 DIAGNOSIS — M549 Dorsalgia, unspecified: Secondary | ICD-10-CM | POA: Diagnosis not present

## 2018-10-27 DIAGNOSIS — Z23 Encounter for immunization: Secondary | ICD-10-CM | POA: Diagnosis not present

## 2018-10-27 DIAGNOSIS — J449 Chronic obstructive pulmonary disease, unspecified: Secondary | ICD-10-CM | POA: Diagnosis not present

## 2018-10-27 DIAGNOSIS — J301 Allergic rhinitis due to pollen: Secondary | ICD-10-CM | POA: Diagnosis not present

## 2018-10-27 DIAGNOSIS — G459 Transient cerebral ischemic attack, unspecified: Secondary | ICD-10-CM | POA: Diagnosis not present

## 2018-10-27 DIAGNOSIS — I50810 Right heart failure, unspecified: Secondary | ICD-10-CM | POA: Diagnosis not present

## 2018-10-27 DIAGNOSIS — E559 Vitamin D deficiency, unspecified: Secondary | ICD-10-CM | POA: Diagnosis not present

## 2018-10-27 DIAGNOSIS — Z125 Encounter for screening for malignant neoplasm of prostate: Secondary | ICD-10-CM | POA: Diagnosis not present

## 2018-10-27 DIAGNOSIS — E118 Type 2 diabetes mellitus with unspecified complications: Secondary | ICD-10-CM | POA: Diagnosis not present

## 2018-10-27 DIAGNOSIS — E063 Autoimmune thyroiditis: Secondary | ICD-10-CM | POA: Diagnosis not present

## 2019-03-16 DIAGNOSIS — J069 Acute upper respiratory infection, unspecified: Secondary | ICD-10-CM | POA: Diagnosis not present

## 2019-03-16 DIAGNOSIS — B349 Viral infection, unspecified: Secondary | ICD-10-CM | POA: Diagnosis not present

## 2019-05-29 DIAGNOSIS — L03011 Cellulitis of right finger: Secondary | ICD-10-CM | POA: Diagnosis not present

## 2019-06-14 DIAGNOSIS — E063 Autoimmune thyroiditis: Secondary | ICD-10-CM | POA: Diagnosis not present

## 2019-06-14 DIAGNOSIS — I50810 Right heart failure, unspecified: Secondary | ICD-10-CM | POA: Diagnosis not present

## 2019-06-14 DIAGNOSIS — Z9181 History of falling: Secondary | ICD-10-CM | POA: Diagnosis not present

## 2019-06-14 DIAGNOSIS — Z1331 Encounter for screening for depression: Secondary | ICD-10-CM | POA: Diagnosis not present

## 2019-06-14 DIAGNOSIS — E118 Type 2 diabetes mellitus with unspecified complications: Secondary | ICD-10-CM | POA: Diagnosis not present

## 2019-06-14 DIAGNOSIS — G459 Transient cerebral ischemic attack, unspecified: Secondary | ICD-10-CM | POA: Diagnosis not present

## 2019-06-14 DIAGNOSIS — E1121 Type 2 diabetes mellitus with diabetic nephropathy: Secondary | ICD-10-CM | POA: Diagnosis not present

## 2019-06-14 DIAGNOSIS — Z23 Encounter for immunization: Secondary | ICD-10-CM | POA: Diagnosis not present

## 2019-06-14 DIAGNOSIS — E785 Hyperlipidemia, unspecified: Secondary | ICD-10-CM | POA: Diagnosis not present

## 2019-06-14 DIAGNOSIS — E559 Vitamin D deficiency, unspecified: Secondary | ICD-10-CM | POA: Diagnosis not present

## 2019-06-14 DIAGNOSIS — Z1211 Encounter for screening for malignant neoplasm of colon: Secondary | ICD-10-CM | POA: Diagnosis not present

## 2019-06-14 DIAGNOSIS — Z139 Encounter for screening, unspecified: Secondary | ICD-10-CM | POA: Diagnosis not present

## 2019-08-15 DIAGNOSIS — J301 Allergic rhinitis due to pollen: Secondary | ICD-10-CM | POA: Diagnosis not present

## 2019-08-15 DIAGNOSIS — Z6831 Body mass index (BMI) 31.0-31.9, adult: Secondary | ICD-10-CM | POA: Diagnosis not present

## 2019-08-15 DIAGNOSIS — Z716 Tobacco abuse counseling: Secondary | ICD-10-CM | POA: Diagnosis not present

## 2019-08-15 DIAGNOSIS — J449 Chronic obstructive pulmonary disease, unspecified: Secondary | ICD-10-CM | POA: Diagnosis not present

## 2019-08-15 DIAGNOSIS — J069 Acute upper respiratory infection, unspecified: Secondary | ICD-10-CM | POA: Diagnosis not present

## 2019-09-14 DIAGNOSIS — E063 Autoimmune thyroiditis: Secondary | ICD-10-CM | POA: Diagnosis not present

## 2019-09-14 DIAGNOSIS — J449 Chronic obstructive pulmonary disease, unspecified: Secondary | ICD-10-CM | POA: Diagnosis not present

## 2019-09-14 DIAGNOSIS — Z23 Encounter for immunization: Secondary | ICD-10-CM | POA: Diagnosis not present

## 2019-09-14 DIAGNOSIS — R05 Cough: Secondary | ICD-10-CM | POA: Diagnosis not present

## 2019-09-14 DIAGNOSIS — E1121 Type 2 diabetes mellitus with diabetic nephropathy: Secondary | ICD-10-CM | POA: Diagnosis not present

## 2019-09-14 DIAGNOSIS — Z6830 Body mass index (BMI) 30.0-30.9, adult: Secondary | ICD-10-CM | POA: Diagnosis not present

## 2019-09-14 DIAGNOSIS — I50810 Right heart failure, unspecified: Secondary | ICD-10-CM | POA: Diagnosis not present

## 2019-09-14 DIAGNOSIS — E785 Hyperlipidemia, unspecified: Secondary | ICD-10-CM | POA: Diagnosis not present

## 2019-09-14 DIAGNOSIS — J301 Allergic rhinitis due to pollen: Secondary | ICD-10-CM | POA: Diagnosis not present

## 2019-09-14 DIAGNOSIS — E118 Type 2 diabetes mellitus with unspecified complications: Secondary | ICD-10-CM | POA: Diagnosis not present

## 2019-09-14 DIAGNOSIS — K219 Gastro-esophageal reflux disease without esophagitis: Secondary | ICD-10-CM | POA: Diagnosis not present

## 2019-09-17 DIAGNOSIS — R062 Wheezing: Secondary | ICD-10-CM | POA: Diagnosis not present

## 2019-09-17 DIAGNOSIS — R05 Cough: Secondary | ICD-10-CM | POA: Diagnosis not present

## 2019-11-12 DIAGNOSIS — Z86010 Personal history of colonic polyps: Secondary | ICD-10-CM | POA: Diagnosis not present

## 2019-11-12 DIAGNOSIS — D120 Benign neoplasm of cecum: Secondary | ICD-10-CM | POA: Diagnosis not present

## 2019-11-12 DIAGNOSIS — D124 Benign neoplasm of descending colon: Secondary | ICD-10-CM | POA: Diagnosis not present

## 2019-11-12 DIAGNOSIS — Z1211 Encounter for screening for malignant neoplasm of colon: Secondary | ICD-10-CM | POA: Diagnosis not present

## 2019-11-12 DIAGNOSIS — K5730 Diverticulosis of large intestine without perforation or abscess without bleeding: Secondary | ICD-10-CM | POA: Diagnosis not present

## 2019-11-12 DIAGNOSIS — D123 Benign neoplasm of transverse colon: Secondary | ICD-10-CM | POA: Diagnosis not present

## 2019-11-12 DIAGNOSIS — K635 Polyp of colon: Secondary | ICD-10-CM | POA: Diagnosis not present

## 2019-11-12 DIAGNOSIS — D125 Benign neoplasm of sigmoid colon: Secondary | ICD-10-CM | POA: Diagnosis not present

## 2019-11-12 DIAGNOSIS — D126 Benign neoplasm of colon, unspecified: Secondary | ICD-10-CM | POA: Diagnosis not present

## 2020-01-15 DIAGNOSIS — E785 Hyperlipidemia, unspecified: Secondary | ICD-10-CM | POA: Diagnosis not present

## 2020-01-15 DIAGNOSIS — E559 Vitamin D deficiency, unspecified: Secondary | ICD-10-CM | POA: Diagnosis not present

## 2020-01-15 DIAGNOSIS — Z6833 Body mass index (BMI) 33.0-33.9, adult: Secondary | ICD-10-CM | POA: Diagnosis not present

## 2020-01-15 DIAGNOSIS — E118 Type 2 diabetes mellitus with unspecified complications: Secondary | ICD-10-CM | POA: Diagnosis not present

## 2020-01-15 DIAGNOSIS — J301 Allergic rhinitis due to pollen: Secondary | ICD-10-CM | POA: Diagnosis not present

## 2020-01-15 DIAGNOSIS — I50810 Right heart failure, unspecified: Secondary | ICD-10-CM | POA: Diagnosis not present

## 2020-01-15 DIAGNOSIS — Z125 Encounter for screening for malignant neoplasm of prostate: Secondary | ICD-10-CM | POA: Diagnosis not present

## 2020-01-15 DIAGNOSIS — E063 Autoimmune thyroiditis: Secondary | ICD-10-CM | POA: Diagnosis not present

## 2020-01-15 DIAGNOSIS — E1121 Type 2 diabetes mellitus with diabetic nephropathy: Secondary | ICD-10-CM | POA: Diagnosis not present

## 2020-01-15 DIAGNOSIS — J449 Chronic obstructive pulmonary disease, unspecified: Secondary | ICD-10-CM | POA: Diagnosis not present

## 2020-02-01 DIAGNOSIS — B349 Viral infection, unspecified: Secondary | ICD-10-CM | POA: Diagnosis not present

## 2020-02-01 DIAGNOSIS — J029 Acute pharyngitis, unspecified: Secondary | ICD-10-CM | POA: Diagnosis not present

## 2020-02-01 DIAGNOSIS — J020 Streptococcal pharyngitis: Secondary | ICD-10-CM | POA: Diagnosis not present

## 2020-02-01 DIAGNOSIS — J101 Influenza due to other identified influenza virus with other respiratory manifestations: Secondary | ICD-10-CM | POA: Diagnosis not present

## 2020-02-19 DIAGNOSIS — R42 Dizziness and giddiness: Secondary | ICD-10-CM | POA: Diagnosis not present

## 2020-02-19 DIAGNOSIS — Z6833 Body mass index (BMI) 33.0-33.9, adult: Secondary | ICD-10-CM | POA: Diagnosis not present

## 2020-02-19 DIAGNOSIS — E1121 Type 2 diabetes mellitus with diabetic nephropathy: Secondary | ICD-10-CM | POA: Diagnosis not present

## 2020-02-19 DIAGNOSIS — E118 Type 2 diabetes mellitus with unspecified complications: Secondary | ICD-10-CM | POA: Diagnosis not present

## 2020-02-19 DIAGNOSIS — I50810 Right heart failure, unspecified: Secondary | ICD-10-CM | POA: Diagnosis not present

## 2020-02-19 DIAGNOSIS — J301 Allergic rhinitis due to pollen: Secondary | ICD-10-CM | POA: Diagnosis not present

## 2020-04-17 DIAGNOSIS — E785 Hyperlipidemia, unspecified: Secondary | ICD-10-CM | POA: Diagnosis not present

## 2020-04-17 DIAGNOSIS — E1121 Type 2 diabetes mellitus with diabetic nephropathy: Secondary | ICD-10-CM | POA: Diagnosis not present

## 2020-04-17 DIAGNOSIS — J449 Chronic obstructive pulmonary disease, unspecified: Secondary | ICD-10-CM | POA: Diagnosis not present

## 2020-04-17 DIAGNOSIS — G459 Transient cerebral ischemic attack, unspecified: Secondary | ICD-10-CM | POA: Diagnosis not present

## 2020-04-17 DIAGNOSIS — J301 Allergic rhinitis due to pollen: Secondary | ICD-10-CM | POA: Diagnosis not present

## 2020-04-17 DIAGNOSIS — E118 Type 2 diabetes mellitus with unspecified complications: Secondary | ICD-10-CM | POA: Diagnosis not present

## 2020-04-17 DIAGNOSIS — K219 Gastro-esophageal reflux disease without esophagitis: Secondary | ICD-10-CM | POA: Diagnosis not present

## 2020-04-17 DIAGNOSIS — E559 Vitamin D deficiency, unspecified: Secondary | ICD-10-CM | POA: Diagnosis not present

## 2020-04-17 DIAGNOSIS — I50810 Right heart failure, unspecified: Secondary | ICD-10-CM | POA: Diagnosis not present

## 2020-04-17 DIAGNOSIS — Z6834 Body mass index (BMI) 34.0-34.9, adult: Secondary | ICD-10-CM | POA: Diagnosis not present

## 2020-04-17 DIAGNOSIS — E063 Autoimmune thyroiditis: Secondary | ICD-10-CM | POA: Diagnosis not present

## 2020-04-24 ENCOUNTER — Other Ambulatory Visit: Payer: Self-pay | Admitting: Legal Medicine

## 2020-05-13 DIAGNOSIS — Z6834 Body mass index (BMI) 34.0-34.9, adult: Secondary | ICD-10-CM | POA: Diagnosis not present

## 2020-05-13 DIAGNOSIS — R21 Rash and other nonspecific skin eruption: Secondary | ICD-10-CM | POA: Diagnosis not present

## 2020-05-13 DIAGNOSIS — E669 Obesity, unspecified: Secondary | ICD-10-CM | POA: Diagnosis not present

## 2020-05-13 DIAGNOSIS — E118 Type 2 diabetes mellitus with unspecified complications: Secondary | ICD-10-CM | POA: Diagnosis not present

## 2020-06-04 DIAGNOSIS — Z6834 Body mass index (BMI) 34.0-34.9, adult: Secondary | ICD-10-CM | POA: Diagnosis not present

## 2020-06-04 DIAGNOSIS — B029 Zoster without complications: Secondary | ICD-10-CM | POA: Diagnosis not present

## 2020-07-21 DIAGNOSIS — E559 Vitamin D deficiency, unspecified: Secondary | ICD-10-CM | POA: Diagnosis not present

## 2020-07-21 DIAGNOSIS — Z6834 Body mass index (BMI) 34.0-34.9, adult: Secondary | ICD-10-CM | POA: Diagnosis not present

## 2020-07-21 DIAGNOSIS — G459 Transient cerebral ischemic attack, unspecified: Secondary | ICD-10-CM | POA: Diagnosis not present

## 2020-07-21 DIAGNOSIS — E1121 Type 2 diabetes mellitus with diabetic nephropathy: Secondary | ICD-10-CM | POA: Diagnosis not present

## 2020-07-21 DIAGNOSIS — E785 Hyperlipidemia, unspecified: Secondary | ICD-10-CM | POA: Diagnosis not present

## 2020-07-21 DIAGNOSIS — E118 Type 2 diabetes mellitus with unspecified complications: Secondary | ICD-10-CM | POA: Diagnosis not present

## 2020-07-21 DIAGNOSIS — K219 Gastro-esophageal reflux disease without esophagitis: Secondary | ICD-10-CM | POA: Diagnosis not present

## 2020-07-21 DIAGNOSIS — Z1331 Encounter for screening for depression: Secondary | ICD-10-CM | POA: Diagnosis not present

## 2020-07-21 DIAGNOSIS — R200 Anesthesia of skin: Secondary | ICD-10-CM | POA: Diagnosis not present

## 2020-07-21 DIAGNOSIS — Z139 Encounter for screening, unspecified: Secondary | ICD-10-CM | POA: Diagnosis not present

## 2020-07-21 DIAGNOSIS — Z9181 History of falling: Secondary | ICD-10-CM | POA: Diagnosis not present

## 2020-07-21 DIAGNOSIS — E063 Autoimmune thyroiditis: Secondary | ICD-10-CM | POA: Diagnosis not present

## 2020-07-21 DIAGNOSIS — J449 Chronic obstructive pulmonary disease, unspecified: Secondary | ICD-10-CM | POA: Diagnosis not present

## 2020-08-08 DIAGNOSIS — E118 Type 2 diabetes mellitus with unspecified complications: Secondary | ICD-10-CM | POA: Diagnosis not present

## 2020-08-08 DIAGNOSIS — I50810 Right heart failure, unspecified: Secondary | ICD-10-CM | POA: Diagnosis not present

## 2020-08-08 DIAGNOSIS — Z6834 Body mass index (BMI) 34.0-34.9, adult: Secondary | ICD-10-CM | POA: Diagnosis not present

## 2020-08-08 DIAGNOSIS — E1121 Type 2 diabetes mellitus with diabetic nephropathy: Secondary | ICD-10-CM | POA: Diagnosis not present

## 2020-08-08 DIAGNOSIS — G629 Polyneuropathy, unspecified: Secondary | ICD-10-CM | POA: Diagnosis not present

## 2020-10-21 DIAGNOSIS — I50810 Right heart failure, unspecified: Secondary | ICD-10-CM | POA: Diagnosis not present

## 2020-10-21 DIAGNOSIS — J449 Chronic obstructive pulmonary disease, unspecified: Secondary | ICD-10-CM | POA: Diagnosis not present

## 2020-10-21 DIAGNOSIS — E063 Autoimmune thyroiditis: Secondary | ICD-10-CM | POA: Diagnosis not present

## 2020-10-21 DIAGNOSIS — Z6834 Body mass index (BMI) 34.0-34.9, adult: Secondary | ICD-10-CM | POA: Diagnosis not present

## 2020-10-21 DIAGNOSIS — E1142 Type 2 diabetes mellitus with diabetic polyneuropathy: Secondary | ICD-10-CM | POA: Diagnosis not present

## 2020-10-21 DIAGNOSIS — Z23 Encounter for immunization: Secondary | ICD-10-CM | POA: Diagnosis not present

## 2020-10-21 DIAGNOSIS — E785 Hyperlipidemia, unspecified: Secondary | ICD-10-CM | POA: Diagnosis not present

## 2020-10-22 DIAGNOSIS — I10 Essential (primary) hypertension: Secondary | ICD-10-CM | POA: Insufficient documentation

## 2020-10-22 DIAGNOSIS — J189 Pneumonia, unspecified organism: Secondary | ICD-10-CM | POA: Insufficient documentation

## 2020-10-22 DIAGNOSIS — J45909 Unspecified asthma, uncomplicated: Secondary | ICD-10-CM | POA: Insufficient documentation

## 2020-10-22 DIAGNOSIS — E119 Type 2 diabetes mellitus without complications: Secondary | ICD-10-CM | POA: Insufficient documentation

## 2020-10-22 DIAGNOSIS — J449 Chronic obstructive pulmonary disease, unspecified: Secondary | ICD-10-CM | POA: Insufficient documentation

## 2020-10-22 DIAGNOSIS — T8859XA Other complications of anesthesia, initial encounter: Secondary | ICD-10-CM | POA: Insufficient documentation

## 2020-10-22 DIAGNOSIS — R809 Proteinuria, unspecified: Secondary | ICD-10-CM | POA: Insufficient documentation

## 2020-10-22 DIAGNOSIS — R011 Cardiac murmur, unspecified: Secondary | ICD-10-CM | POA: Insufficient documentation

## 2020-10-22 DIAGNOSIS — E039 Hypothyroidism, unspecified: Secondary | ICD-10-CM | POA: Insufficient documentation

## 2020-10-22 HISTORY — DX: Chronic obstructive pulmonary disease, unspecified: J44.9

## 2020-11-19 DIAGNOSIS — J029 Acute pharyngitis, unspecified: Secondary | ICD-10-CM | POA: Diagnosis not present

## 2020-11-19 DIAGNOSIS — B349 Viral infection, unspecified: Secondary | ICD-10-CM | POA: Diagnosis not present

## 2020-11-26 DIAGNOSIS — J441 Chronic obstructive pulmonary disease with (acute) exacerbation: Secondary | ICD-10-CM | POA: Diagnosis not present

## 2020-11-26 DIAGNOSIS — Z6833 Body mass index (BMI) 33.0-33.9, adult: Secondary | ICD-10-CM | POA: Diagnosis not present

## 2020-12-01 NOTE — Progress Notes (Signed)
Cardiology Office Note:    Date:  12/02/2020   ID:  Scott Koch, DOB [birth year], MRN [id number]  PCP:  Ocie Doyne., MD  Cardiologist:  Shirlee More, MD   Referring MD: Earlyne Iba, NP  ASSESSMENT:    1. Hypertensive heart disease with heart failure ([place])   2. Chronic obstructive pulmonary disease, unspecified COPD type ([place])   3. Diabetes mellitus without complication ([place])   4. Coronary artery disease of native artery of native heart with stable angina pectoris (HCC)    PLAN:    In order of problems listed above:  He has a history of hypertension superimposed on CAD and despite taking furosemide daily remains symptomatic with edema we will discontinue transition to torsemide recheck echocardiogram to make a decision if he requires additional therapy such as Entresto MRA or SGLT2 inhibitor.  I have asked him to reduce eating outside the home to control his dietary sodium Stable continue his current bronchodilators Poorly controlled A1c severely elevated but since change in treatment his fasting sugars are now less than 150 Stable CAD having no anginal discomfort continue aspirin lipid-lowering high intensity statin  Next appointment 1 month   Medication Adjustments/Labs and Tests Ordered: Current medicines are reviewed at length with the patient today.  Concerns regarding medicines are outlined above.  No orders of the defined types were placed in this encounter.  No orders of the defined types were placed in this encounter.    Chief Complaint  Patient presents with   Congestive Heart Failure    History of Present Illness:    Scott Koch is a 65 y.o. male with a history of type 2 diabetes mellitus with nephropathy hyperlipidemia hypertension and COPD who is being seen today for the evaluation of heart failure at the request of Earlyne Iba, NP. He was previously  diagnosed with heart failure and takes a loop diuretic. Chart review she has had a myocardial perfusion study performed November 2003 ejection fraction 54% normal left ventricular systolic function and normal myocardial perfusion no ischemia  Review of epic and Care Everywhere shows no other cardiology records or evaluation.  He had CAD PCI and 2 stents 24 years ago. He has had no angina symptoms Following that he was told he had heart failure put on a loop diuretic and recently has had more edema exertional shortness of breath orthopnea and PND. Unfortunately eats outside of his home although he is lost 6 pounds in the last few months. He goes to the gym and can walk flat on a treadmill but a quicker speed or uphill he short of breath and stops and has trouble walking outside of his home with the same Presently COPD is in good condition not having sputum production or wheezing. Past Medical History:  Diagnosis Date   Asthma    inhaler used only when seasonal allergies    Complication of anesthesia    patient states"gets rowdy" when wake up   COPD (chronic obstructive pulmonary disease) ([place])    Diabetes mellitus without complication (HCC)    fasting blood sugar avg 120   Heart murmur    Hypertension    Hypothyroidism    Pneumonia    hx   Protein in urine    Shortness of breath dyspnea    hx    Past Surgical History:  Procedure Laterality Date   ANTERIOR CERVICAL DECOMP/DISCECTOMY FUSION  01/13/2012   Procedure: ANTERIOR CERVICAL DECOMPRESSION/DISCECTOMY FUSION 3 LEVELS;  Surgeon: Elaina Hoops, MD;  Location: [facility];  Service: Neurosurgery;  Laterality: N/A;  Cervical three-four, cervical four five, cervical six-seven anterior cervical decompression fusion with plate    CORONARY ANGIOPLASTY  2000   stent   SHOULDER ARTHROSCOPY Right 14    Current Medications: Current Meds  Medication Sig   albuterol (PROVENTIL HFA;VENTOLIN HFA) 108 (90 BASE) MCG/ACT inhaler Inhale 2  puffs into the lungs every 6 (six) hours as needed. For shortness of breath   aspirin EC 81 MG tablet Take 81 mg by mouth daily.   budesonide-formoterol (SYMBICORT) 160-4.5 MCG/ACT inhaler Inhale 2 puffs into the lungs 2 (two) times daily.   Dulaglutide (TRULICITY) 6.73 AL/9.3XT SOPN Inject 75 mg into the skin once a week.   FLOVENT HFA 110 MCG/ACT inhaler Inhale 2 puffs into the lungs 2 (two) times daily.   furosemide (LASIX) 40 MG tablet Take 40 mg by mouth daily.   ipratropium-albuterol (DUONEB) 0.5-2.5 (3) MG/3ML SOLN Take 3 mLs by nebulization every 6 (six) hours as needed (Shortness of breath).   JANUMET XR 50-1000 MG TB24 Take 2 tablets by mouth daily.   levothyroxine (SYNTHROID, LEVOTHROID) 100 MCG tablet Take 100 mcg by mouth daily.   losartan (COZAAR) 100 MG tablet Take 100 mg by mouth daily.   montelukast (SINGULAIR) 10 MG tablet Take 10 mg by mouth at bedtime.   ONETOUCH VERIO test strip USE 1 STRIP EVERY DAY   potassium chloride (MICRO-K) 10 MEQ CR capsule Take 10 mEq by mouth daily.   rosuvastatin (CRESTOR) 10 MG tablet Take 10 mg by mouth daily.     Allergies:   Other, Penicillins, Sulfa antibiotics, and Neosporin [neomycin-bacitracin zn-polymyx]   Social History   Socioeconomic History   Marital status: Married    Spouse name: Not on file   Number of children: Not on file   Years of education: Not on file   Highest education level: Not on file  Occupational History   Not on file  Tobacco Use   Smoking status: Former    Packs/day: 1.00    Years: 35.00    Pack years: 35.00    Types: Cigarettes    Quit date: 12/03/2013    Years since quitting: 7.0   Smokeless tobacco: Never   Tobacco comments:    nicotine patch  Substance and Sexual Activity   Alcohol use: No   Drug use: No   Sexual activity: Not on file  Other Topics Concern   Not on file  Social History Narrative   Not on file   Social Determinants of Health   Financial Resource Strain: Not on file   Food Insecurity: Not on file  Transportation Needs: Not on file  Physical Activity: Not on file  Stress: Not on file  Social Connections: Not on file     Family History: The patient's family history includes Arthritis in his brother; Heart attack in his mother; Heart disease in his brother and mother; Hepatitis in his mother; Hypertension in his brother and mother; Jaundice in his mother; Prostate cancer in his father.  ROS:   ROS Please see the history of present illness.     All other systems reviewed and are negative.  EKGs/Labs/Other Studies Reviewed:    The following studies were reviewed today:   EKG:  EKG is  ordered today.  The ekg ordered today is personally reviewed and demonstrates sinus rhythm normal EKG  Recent Labs: 10/21/2020: Cholesterol 140 LDL 72 triglycerides 188 HDL 36 A1c severely elevated 11.4 Hemoglobin 15.6  Creatinine 0.85 potassium 5.3  Physical Exam:    VS:  BP 120/82 (BP Location: Left Arm, Patient Position: Sitting, Cuff Size: Normal)   Pulse 86   Ht 6' (1.829 m)   Wt 246 lb (111.6 kg)   SpO2 96%   BMI 33.36 kg/m     Wt Readings from Last 3 Encounters:  12/02/20 246 lb (111.6 kg)  01/07/14 252 lb 11.2 oz (114.6 kg)  01/02/14 242 lb 12.8 oz (110.1 kg)     GEN:  Well nourished, well developed in no acute distress HEENT: Normal NECK: No JVD; No carotid bruits LYMPHATICS: No lymphadenopathy CARDIAC: Soft S1 RRR, no murmurs, rubs, gallops RESPIRATORY: Diminished breath sounds without rales, wheezing or rhonchi  ABDOMEN: Soft, non-tender, non-distended MUSCULOSKELETAL: 1+ bilateral edema; No deformity  SKIN: Warm and dry NEUROLOGIC:  Alert and oriented x 3 PSYCHIATRIC:  Normal affect     Signed, Shirlee More, MD  12/02/2020 2:43 PM    [place]

## 2020-12-02 ENCOUNTER — Other Ambulatory Visit: Payer: Self-pay

## 2020-12-02 ENCOUNTER — Encounter: Payer: Self-pay | Admitting: Cardiology

## 2020-12-02 ENCOUNTER — Ambulatory Visit: Payer: PPO | Admitting: Cardiology

## 2020-12-02 VITALS — BP 120/82 | HR 86 | Ht 72.0 in | Wt 246.0 lb

## 2020-12-02 DIAGNOSIS — J449 Chronic obstructive pulmonary disease, unspecified: Secondary | ICD-10-CM

## 2020-12-02 DIAGNOSIS — E119 Type 2 diabetes mellitus without complications: Secondary | ICD-10-CM

## 2020-12-02 DIAGNOSIS — I110 Hypertensive heart disease with heart failure: Secondary | ICD-10-CM

## 2020-12-02 DIAGNOSIS — I25118 Atherosclerotic heart disease of native coronary artery with other forms of angina pectoris: Secondary | ICD-10-CM

## 2020-12-02 MED ORDER — TORSEMIDE 20 MG PO TABS: 20.0000 mg | ORAL_TABLET | Freq: Two times a day (BID) | ORAL | 3 refills | Status: DC

## 2020-12-02 MED ORDER — NITROGLYCERIN 0.4 MG SL SUBL: 0.4000 mg | SUBLINGUAL_TABLET | SUBLINGUAL | 3 refills | Status: DC | PRN

## 2020-12-02 NOTE — Patient Instructions (Addendum)
Medication Instructions:  Your physician has recommended you make the following change in your medication:  STOP: Furosemide START: Torsemide 20 mg take one tablet by mouth twice daily.  *If you need a refill on your cardiac medications before your next appointment, please call your pharmacy*   Lab Work: None If you have labs (blood work) drawn today and your tests are completely normal, you will receive your results only by: [place] (if you have MyChart) OR A paper copy in the mail If you have any lab test that is abnormal or we need to change your treatment, we will call you to review the results.   Testing/Procedures: Your physician has requested that you have an echocardiogram. Echocardiography is a painless test that uses sound waves to create images of your heart. It provides your doctor with information about the size and shape of your heart and how well your heart's chambers and valves are working. This procedure takes approximately one hour. There are no restrictions for this procedure.    Follow-Up: At [facility], you and your health needs are our priority.  As part of our continuing mission to provide you with exceptional heart care, we have created designated Provider Care Teams.  These Care Teams include your primary Cardiologist (physician) and Advanced Practice Providers (APPs -  Physician Assistants and Nurse Practitioners) who all work together to provide you with the care you need, when you need it.  We recommend signing up for the patient portal called "MyChart".  Sign up information is provided on this After Visit Summary.  MyChart is used to connect with patients for Virtual Visits (Telemedicine).  Patients are able to view lab/test results, encounter notes, upcoming appointments, etc.  Non-urgent messages can be sent to your provider as well.   To learn more about what you can do with MyChart, go to [email or web address].    Your next appointment:   1  month(s)  The format for your next appointment:   In Person  Provider:   Shirlee More, MD    Other Instructions  Heart Failure  Weigh yourself every morning when you first wake up and record on a calender or note pad, bring this to your office visits. Using a pill tender can help with taking your medications consistently.  Limit your fluid intake to 2 liters daily  Limit your sodium intake to less than 2-3 grams daily. Ask if you need dietary teaching.  If you gain more than 3 pounds (from your dry weight ), double your dose of diuretic for the day.  If you gain more than 5 pounds (from your dry weight), double your dose of lasix and call your heart failure doctor.  Please do not smoke tobacco since it is very bad for your heart.  Please do not drink alcohol since it can worsen your heart failure.Also avoid OTC nonsteroidal drugs, such as advil, aleve and motrin.  Try to exercise for at least 30 minutes every day because this will help your heart be more efficient. You may be eligible for supervised cardiac rehab, ask your physician.

## 2020-12-04 ENCOUNTER — Emergency Department (HOSPITAL_COMMUNITY): Payer: PPO

## 2020-12-04 ENCOUNTER — Emergency Department (HOSPITAL_COMMUNITY)
Admission: EM | Admit: 2020-12-04 | Discharge: 2020-12-05 | Disposition: A | Discharge status: Home / Self Care | Payer: PPO | Attending: Emergency Medicine | Admitting: Emergency Medicine

## 2020-12-04 ENCOUNTER — Telehealth: Payer: Self-pay | Admitting: Cardiology

## 2020-12-04 ENCOUNTER — Other Ambulatory Visit: Payer: Self-pay

## 2020-12-04 ENCOUNTER — Telehealth: Payer: Self-pay | Admitting: Physician Assistant

## 2020-12-04 DIAGNOSIS — E119 Type 2 diabetes mellitus without complications: Secondary | ICD-10-CM | POA: Diagnosis not present

## 2020-12-04 DIAGNOSIS — R079 Chest pain, unspecified: Secondary | ICD-10-CM | POA: Diagnosis not present

## 2020-12-04 DIAGNOSIS — E039 Hypothyroidism, unspecified: Secondary | ICD-10-CM | POA: Insufficient documentation

## 2020-12-04 DIAGNOSIS — Z96611 Presence of right artificial shoulder joint: Secondary | ICD-10-CM | POA: Insufficient documentation

## 2020-12-04 DIAGNOSIS — R110 Nausea: Secondary | ICD-10-CM | POA: Diagnosis not present

## 2020-12-04 DIAGNOSIS — R61 Generalized hyperhidrosis: Secondary | ICD-10-CM | POA: Diagnosis not present

## 2020-12-04 DIAGNOSIS — Z79899 Other long term (current) drug therapy: Secondary | ICD-10-CM | POA: Insufficient documentation

## 2020-12-04 DIAGNOSIS — R739 Hyperglycemia, unspecified: Secondary | ICD-10-CM | POA: Diagnosis not present

## 2020-12-04 DIAGNOSIS — J45909 Unspecified asthma, uncomplicated: Secondary | ICD-10-CM | POA: Diagnosis not present

## 2020-12-04 DIAGNOSIS — J449 Chronic obstructive pulmonary disease, unspecified: Secondary | ICD-10-CM | POA: Diagnosis not present

## 2020-12-04 DIAGNOSIS — Z7982 Long term (current) use of aspirin: Secondary | ICD-10-CM | POA: Diagnosis not present

## 2020-12-04 DIAGNOSIS — R140 Abdominal distension (gaseous): Secondary | ICD-10-CM | POA: Insufficient documentation

## 2020-12-04 DIAGNOSIS — I10 Essential (primary) hypertension: Secondary | ICD-10-CM | POA: Diagnosis not present

## 2020-12-04 DIAGNOSIS — R0789 Other chest pain: Secondary | ICD-10-CM | POA: Diagnosis not present

## 2020-12-04 DIAGNOSIS — Z87891 Personal history of nicotine dependence: Secondary | ICD-10-CM | POA: Diagnosis not present

## 2020-12-04 DIAGNOSIS — R141 Gas pain: Secondary | ICD-10-CM | POA: Diagnosis not present

## 2020-12-04 DIAGNOSIS — G4489 Other headache syndrome: Secondary | ICD-10-CM | POA: Diagnosis not present

## 2020-12-04 LAB — CBC WITH DIFFERENTIAL/PLATELET
Abs Immature Granulocytes: 0.09 10*3/uL — ABNORMAL HIGH (ref 0.00–0.07)
Basophils Absolute: 0.2 10*3/uL — ABNORMAL HIGH (ref 0.0–0.1)
Basophils Relative: 1 %
Eosinophils Absolute: 0.2 10*3/uL (ref 0.0–0.5)
Eosinophils Relative: 1 %
HCT: 48.6 % (ref 39.0–52.0)
Hemoglobin: 17 g/dL (ref 13.0–17.0)
Immature Granulocytes: 1 %
Lymphocytes Relative: 34 %
Lymphs Abs: 5.4 10*3/uL — ABNORMAL HIGH (ref 0.7–4.0)
MCH: 29.4 pg (ref 26.0–34.0)
MCHC: 35 g/dL (ref 30.0–36.0)
MCV: 84.1 fL (ref 80.0–100.0)
Monocytes Absolute: 1.3 10*3/uL — ABNORMAL HIGH (ref 0.1–1.0)
Monocytes Relative: 8 %
Neutro Abs: 8.8 10*3/uL — ABNORMAL HIGH (ref 1.7–7.7)
Neutrophils Relative %: 55 %
Platelets: 375 10*3/uL (ref 150–400)
RBC: 5.78 MIL/uL (ref 4.22–5.81)
RDW: 12.4 % (ref 11.5–15.5)
WBC: 15.9 10*3/uL — ABNORMAL HIGH (ref 4.0–10.5)
nRBC: 0 % (ref 0.0–0.2)

## 2020-12-04 LAB — BASIC METABOLIC PANEL
BUN/Creatinine Ratio: 26 — ABNORMAL HIGH (ref 10–24)
BUN: 36 mg/dL — ABNORMAL HIGH (ref 8–27)
CO2: 27 mmol/L (ref 20–29)
Calcium: 10 mg/dL (ref 8.6–10.2)
Chloride: 91 mmol/L — ABNORMAL LOW (ref 96–106)
Creatinine, Ser: 1.36 mg/dL — ABNORMAL HIGH (ref 0.76–1.27)
Glucose: 272 mg/dL — ABNORMAL HIGH (ref 70–99)
Potassium: 4.3 mmol/L (ref 3.5–5.2)
Sodium: 134 mmol/L (ref 134–144)
eGFR: 57 mL/min/{1.73_m2} — ABNORMAL LOW (ref >59)

## 2020-12-04 LAB — COMPREHENSIVE METABOLIC PANEL
ALT: 33 U/L (ref 0–44)
AST: 22 U/L (ref 15–41)
Albumin: 3.9 g/dL (ref 3.5–5.0)
Alkaline Phosphatase: 64 U/L (ref 38–126)
Anion gap: 14 (ref 5–15)
BUN: 34 mg/dL — ABNORMAL HIGH (ref 8–23)
CO2: 25 mmol/L (ref 22–32)
Calcium: 9.3 mg/dL (ref 8.9–10.3)
Chloride: 93 mmol/L — ABNORMAL LOW (ref 98–111)
Creatinine, Ser: 1.35 mg/dL — ABNORMAL HIGH (ref 0.61–1.24)
GFR, Estimated: 58 mL/min — ABNORMAL LOW (ref >60)
Glucose, Bld: 283 mg/dL — ABNORMAL HIGH (ref 70–99)
Potassium: 3.3 mmol/L — ABNORMAL LOW (ref 3.5–5.1)
Sodium: 132 mmol/L — ABNORMAL LOW (ref 135–145)
Total Bilirubin: 0.7 mg/dL (ref 0.3–1.2)
Total Protein: 6.9 g/dL (ref 6.5–8.1)

## 2020-12-04 LAB — TROPONIN I (HIGH SENSITIVITY)
Troponin I (High Sensitivity): 11 ng/L (ref <18)
Troponin I (High Sensitivity): 12 ng/L (ref <18)

## 2020-12-04 LAB — LIPASE, BLOOD: Lipase: 34 U/L (ref 11–51)

## 2020-12-04 NOTE — Telephone Encounter (Signed)
Pt wife called stating he is having some sharp pain on his left side in his rib cage area that started yesterday. CP happens every 30 min when he gets up and moves around. Before his prior PCI, he had dull pain and this is sharp pain. Coronary stents are > 66 years old.   BP is 110/82, HR 97. She is reporting rapid heart rate at 97. No dizziness or pre-syncope.   He called our office this morning but did not mention the chest pain, only leg cramping. The chest pain has since worsened. I have asked them to call EMS to rule out STEMI and advised ER evaluation. They expressed understanding of the plan.

## 2020-12-04 NOTE — Addendum Note (Signed)
Addended by: Resa Miner I on: 12/04/2020 09:53 AM   Modules accepted: Orders

## 2020-12-04 NOTE — Telephone Encounter (Signed)
Pt c/o medication issue:  1. Name of Medication: torsemide (DEMADEX) 20 MG tablet  2. How are you currently taking this medication (dosage and times per day)? Take 1 tablet (20 mg total) by mouth 2 (two) times daily.  3. Are you having a reaction (difficulty breathing--STAT)? Leg cramps  4. What is your medication issue? Pt has been up all night with leg cramps and would like to know if Dr. Bettina Gavia wanted to consider adjusting the medication... please advise

## 2020-12-04 NOTE — Telephone Encounter (Signed)
Pt is taking 10 mEq of potassium daily. How do you advise?

## 2020-12-04 NOTE — ED Provider Notes (Signed)
Emergency Medicine Provider Triage Evaluation Note  Scott Koch , a 65 y.o. male  was evaluated in triage.  Pt complains of chest and abdominal pain since yesterday. The pain is intermittent and last between 30 and 60 seconds.  Also had leg cramps last night which have resolved. No N/V. Diarrhea yesterday. Cough with sputum today. CAD/Stent 20 years ago.  He saw his cardiologist, 2 days ago, for a checkup.  At that time Lasix was changed to torsemide.  Patient reports he is "lost 10 pounds since then."  Review of Systems  Positive: Chest pain, abdominal pain, nausea, diarrhea Negative: Fever, chills, diaphoresis  Physical Exam  There were no vitals taken for this visit. Gen:   Awake, somewhat anxious Resp:  Normal effort no wheeze, rales or rhonchi MSK:   Moves extremities without difficulty  Other:  Chest and abdomen are nontender to palpate  Medical Decision Making  Medically screening exam initiated at 8:07 PM.  Appropriate orders placed.  Scott Koch was informed that the remainder of the evaluation will be completed by another provider, this initial triage assessment does not replace that evaluation, and the importance of remaining in the ED until their evaluation is complete.  Screening evaluation for cardiac and abdominal disorders.   Scott Bo, MD 12/04/20 2018

## 2020-12-04 NOTE — Telephone Encounter (Signed)
Spoke to the patient just now and let him know Dr. Joya Gaskins recommendations. He is going to stop by today to get his labs completed.    Encouraged patient to call back with any questions or concerns.

## 2020-12-04 NOTE — ED Triage Notes (Signed)
Pt here via [facility] for cp that woke him up at 0400 today. Pt c/o of epigastric sharp pain that shoots up his sternum, usually lasting 30 seconds - 1 min. He c/o of sob and nausea. Pt took a nitro this afternoon w/ some relief. EMS gave 324 Aspirin. 18g R FA, CBG 319. Pt recently diagnosed w/ chf

## 2020-12-05 ENCOUNTER — Telehealth: Payer: Self-pay

## 2020-12-05 DIAGNOSIS — E119 Type 2 diabetes mellitus without complications: Secondary | ICD-10-CM

## 2020-12-05 DIAGNOSIS — J449 Chronic obstructive pulmonary disease, unspecified: Secondary | ICD-10-CM

## 2020-12-05 DIAGNOSIS — I25118 Atherosclerotic heart disease of native coronary artery with other forms of angina pectoris: Secondary | ICD-10-CM

## 2020-12-05 DIAGNOSIS — I110 Hypertensive heart disease with heart failure: Secondary | ICD-10-CM

## 2020-12-05 MED ORDER — ALUM & MAG HYDROXIDE-SIMETH 200-200-20 MG/5ML PO SUSP
15.0000 mL | Freq: Once | ORAL | Status: AC
  Administered 2020-12-05: 15 mL via ORAL
  Filled 2020-12-05: qty 30

## 2020-12-05 MED ORDER — TORSEMIDE 20 MG PO TABS: 20.0000 mg | ORAL_TABLET | Freq: Once | ORAL | 1 refills | Status: DC

## 2020-12-05 NOTE — Addendum Note (Signed)
Addended by: Senaida Ores on: 12/05/2020 10:44 AM   Modules accepted: Orders

## 2020-12-05 NOTE — ED Provider Notes (Signed)
[facility] EMERGENCY DEPARTMENT Provider Note  CSN: [id number] Arrival date & time: 12/04/20 2000  Chief Complaint(s) Chest Pain  HPI Landrum Carbonell is a 65 y.o. male with a past medical history listed below who presents to the emergency department with 2 days of intermittent epigastric abdominal discomfort that radiates up to the chest immediately followed by burping.  These lasts seconds at a time.  Can occur multiple times per minute or be spread out every few hours.  Pain is alleviated with burping.  No exacerbating factors.  He denies any associated nausea or vomiting.  He did report shortness of breath that improved with albuterol inhaler.  Reports that this is consistent with his COPD.  Currently denies any shortness of breath.  Reports he was recently treated for bronchitis with prednisone.  Also reports that he was started on torsemide and is scheduled for an echocardiogram.   Chest Pain  Past Medical History Past Medical History:  Diagnosis Date   Asthma    inhaler used only when seasonal allergies    Complication of anesthesia    patient states"gets rowdy" when wake up   COPD (chronic obstructive pulmonary disease) ([place])    Diabetes mellitus without complication (HCC)    fasting blood sugar avg 120   Heart murmur    Hypertension    Hypothyroidism    Pneumonia    hx   Protein in urine    Shortness of breath dyspnea    hx   Patient Active Problem List   Diagnosis Date Noted   Asthma 29/92/4268   Complication of anesthesia 10/22/2020   COPD (chronic obstructive pulmonary disease) ([place]) 10/22/2020   Diabetes mellitus without complication ([place]) 34/19/6222   Heart murmur 10/22/2020   Hypertension 10/22/2020   Hypothyroidism 10/22/2020   Pneumonia 10/22/2020   Protein in urine 10/22/2020   HNP (herniated nucleus pulposus), lumbar 01/07/2014   Home Medication(s) Prior to Admission medications   Medication Sig Start Date End Date Taking? Authorizing  Provider  albuterol (PROVENTIL HFA;VENTOLIN HFA) 108 (90 BASE) MCG/ACT inhaler Inhale 2 puffs into the lungs every 6 (six) hours as needed. For shortness of breath    [provider]  aspirin EC 81 MG tablet Take 81 mg by mouth daily.    [provider]  budesonide-formoterol (SYMBICORT) 160-4.5 MCG/ACT inhaler Inhale 2 puffs into the lungs 2 (two) times daily.    [provider]  Dulaglutide (TRULICITY) 9.79 GX/2.1JH SOPN Inject 75 mg into the skin once a week.    [provider]  FLOVENT HFA 110 MCG/ACT inhaler Inhale 2 puffs into the lungs 2 (two) times daily. 07/21/20   [provider]  ipratropium-albuterol (DUONEB) 0.5-2.5 (3) MG/3ML SOLN Take 3 mLs by nebulization every 6 (six) hours as needed (Shortness of breath).    [provider]  JANUMET XR 50-1000 MG TB24 Take 2 tablets by mouth daily. 11/24/20   [provider]  levothyroxine (SYNTHROID, LEVOTHROID) 100 MCG tablet Take 100 mcg by mouth daily.    [provider]  losartan (COZAAR) 100 MG tablet Take 100 mg by mouth daily.    [provider]  montelukast (SINGULAIR) 10 MG tablet Take 10 mg by mouth at bedtime.    [provider]  nitroGLYCERIN (NITROSTAT) 0.4 MG SL tablet Place 1 tablet (0.4 mg total) under the tongue every 5 (five) minutes as needed. 12/02/20 03/02/21  Richardo Priest, MD  [facility] VERIO test strip USE 1 STRIP EVERY DAY 04/25/20  Lillard Anes, MD  potassium chloride (MICRO-K) 10 MEQ CR capsule Take 10 mEq by mouth daily.    [provider]  rosuvastatin (CRESTOR) 10 MG tablet Take 10 mg by mouth daily.    [provider]  torsemide (DEMADEX) 20 MG tablet Take 1 tablet (20 mg total) by mouth 2 (two) times daily. 12/02/20 03/02/21  Richardo Priest, MD                                                                                                                                    Past Surgical History Past  Surgical History:  Procedure Laterality Date   ANTERIOR CERVICAL DECOMP/DISCECTOMY FUSION  01/13/2012   Procedure: ANTERIOR CERVICAL DECOMPRESSION/DISCECTOMY FUSION 3 LEVELS;  Surgeon: Elaina Hoops, MD;  Location: [facility];  Service: Neurosurgery;  Laterality: N/A;  Cervical three-four, cervical four five, cervical six-seven anterior cervical decompression fusion with plate    CORONARY ANGIOPLASTY  2000   stent   SHOULDER ARTHROSCOPY Right 60   Family History Family History  Problem Relation Age of Onset   Heart attack Mother    Jaundice Mother    Hypertension Mother    Heart disease Mother    Hepatitis Mother    Prostate cancer Father    Arthritis Brother    Hypertension Brother    Heart disease Brother     Social History Social History   Tobacco Use   Smoking status: Former    Packs/day: 1.00    Years: 35.00    Pack years: 35.00    Types: Cigarettes    Quit date: 12/03/2013    Years since quitting: 7.0   Smokeless tobacco: Never   Tobacco comments:    nicotine patch  Substance Use Topics   Alcohol use: No   Drug use: No   Allergies Other, Penicillins, Sulfa antibiotics, and Neosporin [neomycin-bacitracin zn-polymyx]  Review of Systems Review of Systems  Cardiovascular:  Positive for chest pain.  All other systems are reviewed and are negative for acute change except as noted in the HPI  Physical Exam Vital Signs  I have reviewed the triage vital signs BP 107/80 (BP Location: Right Arm)   Pulse 82   Temp 98.2 F (36.8 C) (Oral)   Resp 16   SpO2 93%   Physical Exam Vitals reviewed.  Constitutional:      General: He is not in acute distress.    Appearance: He is well-developed. He is not diaphoretic.  HENT:     Head: Normocephalic and atraumatic.     Nose: Nose normal.  Eyes:     General: No scleral icterus.       Right eye: No discharge.        Left eye: No discharge.     Conjunctiva/sclera: Conjunctivae normal.     Pupils: Pupils are equal,  round, and reactive to light.  Cardiovascular:  Rate and Rhythm: Normal rate and regular rhythm.     Heart sounds: No murmur heard.   No friction rub. No gallop.  Pulmonary:     Effort: Pulmonary effort is normal. No respiratory distress.     Breath sounds: Normal breath sounds. No stridor. No rales.  Abdominal:     General: There is no distension.     Palpations: Abdomen is soft.     Tenderness: There is no abdominal tenderness.  Musculoskeletal:        General: No tenderness.     Cervical back: Normal range of motion and neck supple.  Skin:    General: Skin is warm and dry.     Findings: No erythema or rash.  Neurological:     Mental Status: He is alert and oriented to person, place, and time.    ED Results and Treatments Labs (all labs ordered are listed, but only abnormal results are displayed) Labs Reviewed  CBC WITH DIFFERENTIAL/PLATELET - Abnormal; Notable for the following components:      Result Value   WBC 15.9 (*)    Neutro Abs 8.8 (*)    Lymphs Abs 5.4 (*)    Monocytes Absolute 1.3 (*)    Basophils Absolute 0.2 (*)    Abs Immature Granulocytes 0.09 (*)    All other components within normal limits  COMPREHENSIVE METABOLIC PANEL - Abnormal; Notable for the following components:   Sodium 132 (*)    Potassium 3.3 (*)    Chloride 93 (*)    Glucose, Bld 283 (*)    BUN 34 (*)    Creatinine, Ser 1.35 (*)    GFR, Estimated 58 (*)    All other components within normal limits  LIPASE, BLOOD  TROPONIN I (HIGH SENSITIVITY)  TROPONIN I (HIGH SENSITIVITY)                                                                                                                         EKG  EKG Interpretation  Date/Time:  Thursday December 04 2020 22:18:59 EST Ventricular Rate:  98 PR Interval:  154 QRS Duration: 78 QT Interval:  338 QTC Calculation: 431 R Axis:   21 Text Interpretation: Normal sinus rhythm Cannot rule out Anterior infarct , age undetermined Abnormal ECG  Since last tracing of earlier today rate Slower. Otherwise no significant change Confirmed by Daleen Bo [phone]) on 12/04/2020 10:22:54 PM       Radiology DG Chest 2 View  Result Date: 12/04/2020 CLINICAL DATA:  Chest pain EXAM: CHEST - 2 VIEW COMPARISON:  09/17/2019 FINDINGS: The heart size and mediastinal contours are within normal limits. Both lungs are clear. Hardware in the cervical spine. IMPRESSION: No active cardiopulmonary disease. Electronically Signed   By: Donavan Foil M.D.   On: 12/04/2020 20:47    Pertinent labs & imaging results that were available during my care of the patient were reviewed by me and considered in my medical decision making (see MDM for  details).  Medications Ordered in ED Medications  alum & mag hydroxide-simeth (MAALOX/MYLANTA) 200-200-20 MG/5ML suspension 15 mL (15 mLs Oral Given 12/05/20 0237)                                                                                                                                     Procedures Procedures  (including critical care time)  Medical Decision Making / ED Course I have reviewed the nursing notes for this encounter and the patient's prior records (if available in EHR or on provided paperwork).  Traylon Schimming was evaluated in Emergency Department on 12/05/2020 for the symptoms described in the history of present illness. He was evaluated in the context of the global COVID-19 pandemic, which necessitated consideration that the patient might be at risk for infection with the SARS-CoV-2 virus that causes COVID-19. Institutional protocols and algorithms that pertain to the evaluation of patients at risk for COVID-19 are in a state of rapid change based on information released by regulatory bodies including the CDC and federal and state organizations. These policies and algorithms were followed during the patient's care in the ED.  Clinical Course as of 12/05/20 0420  Fri Dec 05, 2020  0227 WBC(!):  15.9 Just finished steroid course for bronchitis  [PC]    Clinical Course User Index [PC] Kailo Kosik, Grayce Sessions, MD    Highly atypical chest pain.  EKG without acute ischemic changes or evidence of pericarditis.  Serial troponins negative x2.  Doubt ACS.  Presentation not classic for aortic dissection or esophageal perforation.  Low suspicion for pulmonary embolism.  Chest x-ray without evidence suggestive of pneumonia, pneumothorax, pneumomediastinum.  No abnormal contour of the mediastinum to suggest dissection. No evidence of acute injuries.  Likely GI related.  Symptoms improved with GI cocktail.  Pertinent labs & imaging results that were available during my care of the patient were reviewed by me and considered in my medical decision making:  Labs were notable for mild AKI likely related to diuresing.  Instructed patient to call cardiologist to see about medication adjustment and repeat labs next week.  Final Clinical Impression(s) / ED Diagnoses Final diagnoses:  Intermittent chest pain  Flatulence/gas pain/belching   The patient appears reasonably screened and/or stabilized for discharge and I doubt any other medical condition or other [facility] requiring further screening, evaluation, or treatment in the ED at this time prior to discharge. Safe for discharge with strict return precautions.  Disposition: Discharge  Condition: Good  I have discussed the results, Dx and Tx plan with the patient/family who expressed understanding and agree(s) with the plan. Discharge instructions discussed at length. The patient/family was given strict return precautions who verbalized understanding of the instructions. No further questions at time of discharge.    ED Discharge Orders     None       Follow Up: Earlyne Iba, NP [place]  [id number] [phone]  Call  to schedule an appointment for close follow up     This chart was dictated using  voice recognition software.  Despite best efforts to proofread,  errors can occur which can change the documentation meaning.    Fatima Blank, MD 12/05/20 [phone]

## 2020-12-05 NOTE — Telephone Encounter (Signed)
Patient informed of results and to decrease torsemide to once per day he understood no further questions.

## 2020-12-05 NOTE — Telephone Encounter (Signed)
-----   Message from Richardo Priest, MD sent at 12/05/2020  8:11 AM EST ----- I would like for him to decrease his torsemide once daily

## 2020-12-05 NOTE — Telephone Encounter (Signed)
Left VM to call back 

## 2020-12-05 NOTE — Telephone Encounter (Signed)
Patient returning call from [place]

## 2020-12-11 ENCOUNTER — Ambulatory Visit (INDEPENDENT_AMBULATORY_CARE_PROVIDER_SITE_OTHER): Payer: PPO

## 2020-12-11 ENCOUNTER — Other Ambulatory Visit: Payer: Self-pay

## 2020-12-11 DIAGNOSIS — E119 Type 2 diabetes mellitus without complications: Secondary | ICD-10-CM

## 2020-12-11 DIAGNOSIS — I110 Hypertensive heart disease with heart failure: Secondary | ICD-10-CM

## 2020-12-11 DIAGNOSIS — J449 Chronic obstructive pulmonary disease, unspecified: Secondary | ICD-10-CM

## 2020-12-11 DIAGNOSIS — I25118 Atherosclerotic heart disease of native coronary artery with other forms of angina pectoris: Secondary | ICD-10-CM

## 2020-12-11 LAB — ECHOCARDIOGRAM COMPLETE
Area-P 1/2: 5.06 cm2
Calc EF: 48.1 %
S' Lateral: 2.8 cm
Single Plane A2C EF: 49.2 %
Single Plane A4C EF: 45.1 %

## 2020-12-12 ENCOUNTER — Telehealth: Payer: Self-pay

## 2020-12-12 NOTE — Telephone Encounter (Signed)
-----   Message from Richardo Priest, MD sent at 12/11/2020  5:46 PM EST ----- Good result heart muscle function remains normal no change in treatment

## 2020-12-12 NOTE — Telephone Encounter (Signed)
Spoke with patient regarding results and recommendation.  Patient verbalizes understanding and is agreeable to plan of care. Advised patient to call back with any issues or concerns.  

## 2020-12-19 DIAGNOSIS — E669 Obesity, unspecified: Secondary | ICD-10-CM | POA: Diagnosis not present

## 2020-12-19 DIAGNOSIS — Z1331 Encounter for screening for depression: Secondary | ICD-10-CM | POA: Diagnosis not present

## 2020-12-19 DIAGNOSIS — E785 Hyperlipidemia, unspecified: Secondary | ICD-10-CM | POA: Diagnosis not present

## 2020-12-19 DIAGNOSIS — Z6832 Body mass index (BMI) 32.0-32.9, adult: Secondary | ICD-10-CM | POA: Diagnosis not present

## 2020-12-19 DIAGNOSIS — Z9181 History of falling: Secondary | ICD-10-CM | POA: Diagnosis not present

## 2020-12-19 DIAGNOSIS — Z0000 Encounter for general adult medical examination without abnormal findings: Secondary | ICD-10-CM | POA: Diagnosis not present

## 2020-12-25 ENCOUNTER — Other Ambulatory Visit: Payer: Self-pay | Admitting: Legal Medicine

## 2020-12-30 DIAGNOSIS — M542 Cervicalgia: Secondary | ICD-10-CM | POA: Diagnosis not present

## 2020-12-30 DIAGNOSIS — M5440 Lumbago with sciatica, unspecified side: Secondary | ICD-10-CM | POA: Diagnosis not present

## 2020-12-30 DIAGNOSIS — M5137 Other intervertebral disc degeneration, lumbosacral region: Secondary | ICD-10-CM | POA: Diagnosis not present

## 2020-12-30 DIAGNOSIS — M4316 Spondylolisthesis, lumbar region: Secondary | ICD-10-CM | POA: Diagnosis not present

## 2021-01-04 NOTE — Progress Notes (Signed)
Cardiology Office Note:    Date:  01/05/2021   ID:  Scott Koch, DOB 10-Oct-1955, MRN [id number]  PCP:  Earlyne Iba, NP  Cardiologist:  Shirlee More, MD    Referring MD: Ocie Doyne., MD    ASSESSMENT:    1. Hypertensive heart disease with heart failure ([place])   2. Coronary artery disease of native artery of native heart with stable angina pectoris ([place])   3. Type 2 diabetes mellitus without complication, with long-term current use of insulin (HCC)    PLAN:    In order of problems listed above:  He is doing much better with torsemide heart failure is compensated he has no edema and he is compliant with sodium restriction.  With his recent hypokalemia in the emergency room recheck is serum potassium and magnesium which is often the problem.  He will trend blood pressure at home and continue his ARB. Recent presentation to the ED with symptoms as he describes himself quite anginal we will do a myocardial perfusion study in my office and if abnormal he may require coronary angiography with a combination of heart failure and ED visit with chest pain.  Continue medical therapy including his aspirin high intensity statin. Stable diabetes managed by his PCP   Next appointment: 3 months   Medication Adjustments/Labs and Tests Ordered: Current medicines are reviewed at length with the patient today.  Concerns regarding medicines are outlined above.  No orders of the defined types were placed in this encounter.  No orders of the defined types were placed in this encounter.   Chief Complaint  Patient presents with   Follow-up   Congestive Heart Failure    History of Present Illness:    Scott Koch is a 65 y.o. male with a hx of CAD with remote PCI and stent greater than 20 years ago heart failure with normal ejection fraction hypertension COPD type 2 diabetes mellitus with nephropathy and hyperlipidemia last seen 12/02/2020 with ongoing edema and transition from furosemide to  torsemide..  Compliance with diet, lifestyle and medications: yes  Echocardiogram 12/11/2020 shows EF low normal 50 to 55% mild concentric LVH and grade 1 diastolic dysfunction.  Right ventricle is normal in size and function normal pulmonary artery pressure and no significant valvular abnormality was noted.  He was [facility] at [facility] ED 12/04/2020 with what was described as intermittent epigastric abdominal discomfort radiating to the chest and associated with belching.  The symptoms were nonanginal.  High-sensitivity troponin initial and repeat were both normal chest x-ray showed no acute cardiopulmonary disease hemoglobin 17 creatinine 1.35 GFR 58 cc/min.  EKG independently reviewed by me showed sinus rhythm poor R wave progression otherwise normal  Past Medical History:  Diagnosis Date   Asthma    inhaler used only when seasonal allergies    Complication of anesthesia    patient states"gets rowdy" when wake up   COPD (chronic obstructive pulmonary disease) ([place])    Diabetes mellitus without complication (HCC)    fasting blood sugar avg 120   Heart murmur    Hypertension    Hypothyroidism    Pneumonia    hx   Protein in urine    Shortness of breath dyspnea    hx    Past Surgical History:  Procedure Laterality Date   ANTERIOR CERVICAL DECOMP/DISCECTOMY FUSION  01/13/2012   Procedure: ANTERIOR CERVICAL DECOMPRESSION/DISCECTOMY FUSION 3 LEVELS;  Surgeon: Elaina Hoops, MD;  Location: [place] [facility];  Service: Neurosurgery;  Laterality:  N/A;  Cervical three-four, cervical four five, cervical six-seven anterior cervical decompression fusion with plate    CORONARY ANGIOPLASTY  2000   stent   SHOULDER ARTHROSCOPY Right 14    Current Medications: Current Meds  Medication Sig   albuterol (PROVENTIL HFA;VENTOLIN HFA) 108 (90 BASE) MCG/ACT inhaler Inhale 2 puffs into the lungs every 6 (six) hours as needed. For shortness of breath   aspirin EC 81 MG tablet Take 81 mg by mouth  daily.   budesonide-formoterol (SYMBICORT) 160-4.5 MCG/ACT inhaler Inhale 2 puffs into the lungs 2 (two) times daily.   Dulaglutide (TRULICITY) 0.27 XA/1.2IN SOPN Inject 75 mg into the skin once a week.   FLOVENT HFA 110 MCG/ACT inhaler Inhale 2 puffs into the lungs 2 (two) times daily.   ipratropium-albuterol (DUONEB) 0.5-2.5 (3) MG/3ML SOLN Take 3 mLs by nebulization every 6 (six) hours as needed (Shortness of breath).   JANUMET XR 50-1000 MG TB24 Take 2 tablets by mouth daily.   levothyroxine (SYNTHROID, LEVOTHROID) 100 MCG tablet Take 100 mcg by mouth daily.   losartan (COZAAR) 100 MG tablet Take 100 mg by mouth daily.   montelukast (SINGULAIR) 10 MG tablet Take 10 mg by mouth at bedtime.   nitroGLYCERIN (NITROSTAT) 0.4 MG SL tablet Place 1 tablet (0.4 mg total) under the tongue every 5 (five) minutes as needed.   ONETOUCH VERIO test strip USE 1 STRIP EVERY DAY   potassium chloride (MICRO-K) 10 MEQ CR capsule Take 10 mEq by mouth daily.   rosuvastatin (CRESTOR) 10 MG tablet Take 10 mg by mouth daily.   torsemide (DEMADEX) 20 MG tablet Take 1 tablet (20 mg total) by mouth once for 1 dose.     Allergies:   Other, Penicillins, Sulfa antibiotics, and Neosporin [neomycin-bacitracin zn-polymyx]   Social History   Socioeconomic History   Marital status: Married    Spouse name: Not on file   Number of children: Not on file   Years of education: Not on file   Highest education level: Not on file  Occupational History   Not on file  Tobacco Use   Smoking status: Former    Packs/day: 1.00    Years: 35.00    Pack years: 35.00    Types: Cigarettes    Quit date: 12/03/2013    Years since quitting: 7.0   Smokeless tobacco: Never   Tobacco comments:    nicotine patch  Substance and Sexual Activity   Alcohol use: No   Drug use: No   Sexual activity: Not on file  Other Topics Concern   Not on file  Social History Narrative   Not on file   Social Determinants of Health   Financial  Resource Strain: Not on file  Food Insecurity: Not on file  Transportation Needs: Not on file  Physical Activity: Not on file  Stress: Not on file  Social Connections: Not on file     Family History: The patient's family history includes Arthritis in his brother; Heart attack in his mother; Heart disease in his brother and mother; Hepatitis in his mother; Hypertension in his brother and mother; Jaundice in his mother; Prostate cancer in his father. ROS:   Please see the history of present illness.    All other systems reviewed and are negative.  EKGs/Labs/Other Studies Reviewed:    The following studies were reviewed today:   Recent Labs: 12/04/2020: ALT 33; BUN 34; Creatinine, Ser 1.35; Hemoglobin 17.0; Platelets 375; Potassium 3.3; Sodium 132  Recent Lipid Panel  No results found for: CHOL, TRIG, HDL, CHOLHDL, VLDL, LDLCALC, LDLDIRECT  Physical Exam:    VS:  BP (!) 154/80   Pulse 64   Ht 6' (1.829 m)   Wt 254 lb (115.2 kg)   SpO2 98%   BMI 34.45 kg/m     Wt Readings from Last 3 Encounters:  01/05/21 254 lb (115.2 kg)  12/02/20 246 lb (111.6 kg)  01/07/14 252 lb 11.2 oz (114.6 kg)     GEN:  Well nourished, well developed in no acute distress HEENT: Normal NECK: No JVD; No carotid bruits LYMPHATICS: No lymphadenopathy CARDIAC: RRR, no murmurs, rubs, gallops RESPIRATORY:  Clear to auscultation without rales, wheezing or rhonchi  ABDOMEN: Soft, non-tender, non-distended MUSCULOSKELETAL:  No edema; No deformity  SKIN: Warm and dry NEUROLOGIC:  Alert and oriented x 3 PSYCHIATRIC:  Normal affect    Signed, Shirlee More, MD  01/05/2021 8:32 AM    [facility]

## 2021-01-05 ENCOUNTER — Other Ambulatory Visit: Payer: Self-pay

## 2021-01-05 ENCOUNTER — Encounter: Payer: Self-pay | Admitting: Cardiology

## 2021-01-05 ENCOUNTER — Ambulatory Visit: Payer: PPO | Admitting: Cardiology

## 2021-01-05 VITALS — BP 154/80 | HR 64 | Ht 72.0 in | Wt 254.0 lb

## 2021-01-05 DIAGNOSIS — Z794 Long term (current) use of insulin: Secondary | ICD-10-CM

## 2021-01-05 DIAGNOSIS — E119 Type 2 diabetes mellitus without complications: Secondary | ICD-10-CM | POA: Diagnosis not present

## 2021-01-05 DIAGNOSIS — I110 Hypertensive heart disease with heart failure: Secondary | ICD-10-CM

## 2021-01-05 DIAGNOSIS — I25118 Atherosclerotic heart disease of native coronary artery with other forms of angina pectoris: Secondary | ICD-10-CM

## 2021-01-05 LAB — BASIC METABOLIC PANEL
BUN/Creatinine Ratio: 19 (ref 10–24)
BUN: 18 mg/dL (ref 8–27)
CO2: 26 mmol/L (ref 20–29)
Calcium: 10 mg/dL (ref 8.6–10.2)
Chloride: 99 mmol/L (ref 96–106)
Creatinine, Ser: 0.93 mg/dL (ref 0.76–1.27)
Glucose: 131 mg/dL — ABNORMAL HIGH (ref 70–99)
Potassium: 5.1 mmol/L (ref 3.5–5.2)
Sodium: 140 mmol/L (ref 134–144)
eGFR: 91 mL/min/{1.73_m2} (ref >59)

## 2021-01-05 LAB — MAGNESIUM: Magnesium: 1.6 mg/dL (ref 1.6–2.3)

## 2021-01-05 NOTE — Patient Instructions (Signed)
Medication Instructions:  Your physician recommends that you continue on your current medications as directed. Please refer to the Current Medication list given to you today.  *If you need a refill on your cardiac medications before your next appointment, please call your pharmacy*   Lab Work: Your physician recommends that you have a BMP and magnesium level.  If you have labs (blood work) drawn today and your tests are completely normal, you will receive your results only by: [address] (if you have MyChart) OR A paper copy in the mail If you have any lab test that is abnormal or we need to change your treatment, we will call you to review the results.   Testing/Procedures: Your physician has requested that you have a lexiscan myoview. For further information please visit [email or web address]. Please follow instruction sheet, as given.  The test will take approximately 3 to 4 hours to complete; you may bring reading material.  If someone comes with you to your appointment, they will need to remain in the main lobby due to limited space in the testing area.   How to prepare for your Myocardial Perfusion Test: Do not eat or drink 3 hours prior to your test, except you may have water. Do not consume products containing caffeine (regular or decaffeinated) 12 hours prior to your test. (ex: coffee, chocolate, sodas, tea). Do bring a list of your current medications with you.  If not listed below, you may take your medications as normal. Do wear comfortable clothes (no dresses or overalls) and walking shoes, tennis shoes preferred (No heels or open toe shoes are allowed). Do NOT wear cologne, perfume, aftershave, or lotions (deodorant is allowed). If these instructions are not followed, your test will have to be rescheduled.    Follow-Up: At [facility], you and your health needs are our priority.  As part of our continuing mission to provide you with exceptional heart care, we have  created designated Provider Care Teams.  These Care Teams include your primary Cardiologist (physician) and Advanced Practice Providers (APPs -  Physician Assistants and Nurse Practitioners) who all work together to provide you with the care you need, when you need it.  We recommend signing up for the patient portal called "MyChart".  Sign up information is provided on this After Visit Summary.  MyChart is used to connect with patients for Virtual Visits (Telemedicine).  Patients are able to view lab/test results, encounter notes, upcoming appointments, etc.  Non-urgent messages can be sent to your provider as well.   To learn more about what you can do with MyChart, go to [email or web address].    Your next appointment:   3 month(s)  The format for your next appointment:   In Person  Provider:   Shirlee More, MD   Other Instructions Cardiac Nuclear Scan A cardiac nuclear scan is a test that is done to check the flow of blood to your heart. It is done when you are resting and when you are exercising. The test looks for problems such as: Not enough blood reaching a portion of the heart. The heart muscle not working as it should. You may need this test if: You have heart disease. You have had lab results that are not normal. You have had heart surgery or a balloon procedure to open up blocked arteries (angioplasty). You have chest pain. You have shortness of breath. In this test, a special dye (tracer) is put into your bloodstream. The tracer will travel to your heart. A  camera will then take pictures of your heart to see how the tracer moves through your heart. This test is usually done at a hospital and takes 2-4 hours. Tell a doctor about: Any allergies you have. All medicines you are taking, including vitamins, herbs, eye drops, creams, and over-the-counter medicines. Any problems you or family members have had with anesthetic medicines. Any blood disorders you have. Any surgeries  you have had. Any medical conditions you have. Whether you are pregnant or may be pregnant. What are the risks? Generally, this is a safe test. However, problems may occur, such as: Serious chest pain and heart attack. This is only a risk if the stress portion of the test is done. Rapid heartbeat. A feeling of warmth in your chest. This feeling usually does not last long. Allergic reaction to the tracer. What happens before the test? Ask your doctor about changing or stopping your normal medicines. This is important. Follow instructions from your doctor about what you cannot eat or drink. Remove your jewelry on the day of the test. What happens during the test? An IV tube will be inserted into one of your veins. Your doctor will give you a small amount of tracer through the IV tube. You will wait for 20-40 minutes while the tracer moves through your bloodstream. Your heart will be monitored with an electrocardiogram (ECG). You will lie down on an exam table. Pictures of your heart will be taken for about 15-20 minutes. You may also have a stress test. For this test, one of these things may be done: You will be asked to exercise on a treadmill or a stationary bike. You will be given medicines that will make your heart work harder. This is done if you are unable to exercise. When blood flow to your heart has peaked, a tracer will again be given through the IV tube. After 20-40 minutes, you will get back on the exam table. More pictures will be taken of your heart. Depending on the tracer that is used, more pictures may need to be taken 3-4 hours later. Your IV tube will be removed when the test is over. The test may vary among doctors and hospitals. What happens after the test? Ask your doctor: Whether you can return to your normal schedule, including diet, activities, and medicines. Whether you should drink more fluids. This will help to remove the tracer from your body. Drink enough  fluid to keep your pee (urine) pale yellow. Ask your doctor, or the department that is doing the test: When will my results be ready? How will I get my results? Summary A cardiac nuclear scan is a test that is done to check the flow of blood to your heart. Tell your doctor whether you are pregnant or may be pregnant. Before the test, ask your doctor about changing or stopping your normal medicines. This is important. Ask your doctor whether you can return to your normal activities. You may be asked to drink more fluids. This information is not intended to replace advice given to you by your health care provider. Make sure you discuss any questions you have with your health care provider. Document Revised: 05/03/2018 Document Reviewed: 06/27/2017 Elsevier Patient Education  [employer].

## 2021-01-09 ENCOUNTER — Telehealth (HOSPITAL_COMMUNITY): Payer: Self-pay | Admitting: *Deleted

## 2021-01-09 NOTE — Telephone Encounter (Signed)
Left message on voicemail per DPR in reference to upcoming appointment scheduled on 01/15/21 at 7:45 with detailed instructions given per Myocardial Perfusion Study Information Sheet for the test. LM to arrive 15 minutes early, and that it is imperative to arrive on time for appointment to keep from having the test rescheduled. If you need to cancel or reschedule your appointment, please call the office within 24 hours of your appointment. Failure to do so may result in a cancellation of your appointment, and a $50 no show fee. Phone number given for call back for any questions.

## 2021-01-13 DIAGNOSIS — Z6833 Body mass index (BMI) 33.0-33.9, adult: Secondary | ICD-10-CM | POA: Diagnosis not present

## 2021-01-13 DIAGNOSIS — J0100 Acute maxillary sinusitis, unspecified: Secondary | ICD-10-CM | POA: Diagnosis not present

## 2021-01-15 ENCOUNTER — Ambulatory Visit (INDEPENDENT_AMBULATORY_CARE_PROVIDER_SITE_OTHER): Payer: PPO

## 2021-01-15 ENCOUNTER — Other Ambulatory Visit: Payer: Self-pay

## 2021-01-15 DIAGNOSIS — I25118 Atherosclerotic heart disease of native coronary artery with other forms of angina pectoris: Secondary | ICD-10-CM

## 2021-01-15 MED ORDER — TECHNETIUM TC 99M TETROFOSMIN IV KIT
10.0000 | PACK | Freq: Once | INTRAVENOUS | Status: AC | PRN
  Administered 2021-01-15: 10 via INTRAVENOUS

## 2021-01-15 MED ORDER — REGADENOSON 0.4 MG/5ML IV SOLN
0.4000 mg | Freq: Once | INTRAVENOUS | Status: AC
Start: 2021-01-15 — End: 2021-01-15
  Administered 2021-01-15: 0.4 mg via INTRAVENOUS

## 2021-01-15 MED ORDER — TECHNETIUM TC 99M TETROFOSMIN IV KIT
29.4000 | PACK | Freq: Once | INTRAVENOUS | Status: AC | PRN
  Administered 2021-01-15: 29.4 via INTRAVENOUS

## 2021-01-16 ENCOUNTER — Telehealth: Payer: Self-pay

## 2021-01-16 LAB — MYOCARDIAL PERFUSION IMAGING
LV dias vol: 68 mL (ref 62–150)
LV sys vol: 19 mL
Nuc Stress EF: 71 %
Peak HR: 110 {beats}/min
Rest HR: 76 {beats}/min
Rest Nuclear Isotope Dose: 10 mCi
SDS: 4
SRS: 0
SSS: 4
ST Depression (mm): 0 mm
Stress Nuclear Isotope Dose: 29.4 mCi
TID: 0.96

## 2021-01-16 NOTE — Telephone Encounter (Signed)
-----   Message from Richardo Priest, MD sent at 01/16/2021  4:00 PM EST ----- This is a very good result normal no findings of previous/current adequate blood flow or ischemia this is good news.

## 2021-01-16 NOTE — Telephone Encounter (Signed)
Spoke with patient regarding results and recommendation.  Patient verbalizes understanding and is agreeable to plan of care. Advised patient to call back with any issues or concerns.  

## 2021-01-27 DIAGNOSIS — M50322 Other cervical disc degeneration at C5-C6 level: Secondary | ICD-10-CM | POA: Diagnosis not present

## 2021-01-27 DIAGNOSIS — M542 Cervicalgia: Secondary | ICD-10-CM | POA: Diagnosis not present

## 2021-01-27 DIAGNOSIS — M4802 Spinal stenosis, cervical region: Secondary | ICD-10-CM | POA: Diagnosis not present

## 2021-01-27 DIAGNOSIS — M2578 Osteophyte, vertebrae: Secondary | ICD-10-CM | POA: Diagnosis not present

## 2021-01-28 DIAGNOSIS — E559 Vitamin D deficiency, unspecified: Secondary | ICD-10-CM | POA: Diagnosis not present

## 2021-01-28 DIAGNOSIS — E1169 Type 2 diabetes mellitus with other specified complication: Secondary | ICD-10-CM | POA: Diagnosis not present

## 2021-01-28 DIAGNOSIS — Z6833 Body mass index (BMI) 33.0-33.9, adult: Secondary | ICD-10-CM | POA: Diagnosis not present

## 2021-01-28 DIAGNOSIS — E782 Mixed hyperlipidemia: Secondary | ICD-10-CM | POA: Diagnosis not present

## 2021-01-28 DIAGNOSIS — E785 Hyperlipidemia, unspecified: Secondary | ICD-10-CM | POA: Diagnosis not present

## 2021-01-28 DIAGNOSIS — K219 Gastro-esophageal reflux disease without esophagitis: Secondary | ICD-10-CM | POA: Diagnosis not present

## 2021-01-28 DIAGNOSIS — E034 Atrophy of thyroid (acquired): Secondary | ICD-10-CM | POA: Diagnosis not present

## 2021-01-28 DIAGNOSIS — I50810 Right heart failure, unspecified: Secondary | ICD-10-CM | POA: Diagnosis not present

## 2021-01-28 DIAGNOSIS — J449 Chronic obstructive pulmonary disease, unspecified: Secondary | ICD-10-CM | POA: Diagnosis not present

## 2021-02-03 DIAGNOSIS — M5412 Radiculopathy, cervical region: Secondary | ICD-10-CM | POA: Diagnosis not present

## 2021-02-19 DIAGNOSIS — Z6833 Body mass index (BMI) 33.0-33.9, adult: Secondary | ICD-10-CM | POA: Diagnosis not present

## 2021-02-19 DIAGNOSIS — J449 Chronic obstructive pulmonary disease, unspecified: Secondary | ICD-10-CM | POA: Diagnosis not present

## 2021-02-19 DIAGNOSIS — J441 Chronic obstructive pulmonary disease with (acute) exacerbation: Secondary | ICD-10-CM | POA: Diagnosis not present

## 2021-03-13 ENCOUNTER — Other Ambulatory Visit: Payer: Self-pay | Admitting: Neurosurgery

## 2021-03-13 ENCOUNTER — Telehealth: Payer: Self-pay

## 2021-03-13 NOTE — Telephone Encounter (Signed)
Dr. Bettina Gavia, we were recently contacted for preoperative risk stratification on this patient for C5-C6 anterior cervical fusion under general anesthesia. Given his recent angina, with subsequent stress test, do you feel he is acceptable risk for noncardiac surgery?

## 2021-03-13 NOTE — Telephone Encounter (Signed)
° °  Pre-operative Risk Assessment    Patient Name: Scott Koch  DOB: [birth year] MRN: [id number]      Request for Surgical Clearance    Procedure:   CS-6 Anterior Cervical Fusion  Date of Surgery:  Clearance 04/13/21                                 Surgeon:  Dr. Kary Kos Surgeon's Group or Practice Name:  [facility] Phone number:  [phone] Fax number:  [phone]   Type of Clearance Requested:   - Medical    Type of Anesthesia:  General    Additional requests/questions:    SignedLowella Grip   03/13/2021, 3:33 PM

## 2021-03-16 NOTE — Telephone Encounter (Signed)
° °  Name: Scott Koch  DOB: 25-Sep-1955  MRN: [id number]   Primary Cardiologist: Shirlee More, MD  Chart reviewed as part of pre-operative protocol coverage.  Scott Koch was last seen on 01/05/21 by Dr,.Munley.    Per Dr. Bettina Gavia: Recent cardiac testing was normal in January and his PCI was more than 20 years ago. In my opinion he is optimized for spine surgery.   Therefore, based on ACC/AHA guidelines, the patient would be at acceptable risk for the planned procedure without further cardiovascular testing.   I will route this recommendation to the requesting party via Epic fax function and remove from pre-op pool. Please call with questions.  Tami Lin Tresea Heine, PA 03/16/2021, 10:46 AM

## 2021-03-18 ENCOUNTER — Other Ambulatory Visit: Payer: Self-pay | Admitting: *Deleted

## 2021-03-18 ENCOUNTER — Encounter: Payer: Self-pay | Admitting: Sports Medicine

## 2021-03-18 ENCOUNTER — Ambulatory Visit: Payer: PPO | Admitting: Sports Medicine

## 2021-03-18 DIAGNOSIS — E119 Type 2 diabetes mellitus without complications: Secondary | ICD-10-CM | POA: Diagnosis not present

## 2021-03-18 DIAGNOSIS — B359 Dermatophytosis, unspecified: Secondary | ICD-10-CM

## 2021-03-18 DIAGNOSIS — B351 Tinea unguium: Secondary | ICD-10-CM | POA: Diagnosis not present

## 2021-03-18 DIAGNOSIS — M79675 Pain in left toe(s): Secondary | ICD-10-CM | POA: Diagnosis not present

## 2021-03-18 DIAGNOSIS — M79674 Pain in right toe(s): Secondary | ICD-10-CM | POA: Diagnosis not present

## 2021-03-18 MED ORDER — CLOTRIMAZOLE 1 % EX SOLN: 1.0000 "application " | Freq: Every day | CUTANEOUS | 5 refills | Status: DC | PRN

## 2021-03-18 MED ORDER — NYSTATIN-TRIAMCINOLONE 100000-0.1 UNIT/GM-% EX OINT: 1.0000 "application " | TOPICAL_OINTMENT | Freq: Two times a day (BID) | CUTANEOUS | 0 refills | Status: DC

## 2021-03-18 NOTE — Progress Notes (Signed)
Subjective: Scott Koch is a 66 y.o. male patient with history of diabetes who presents to office today complaining of long,mildly painful nails  while ambulating in shoes; unable to trim reports that after he had his back surgery 9 years ago his wife started doing his nails states that it is now becoming very difficult for the wife to help with this and states that he has noticed a red dot on the left third toe for the last week denies any significant redness warmth swelling drainage or any other acute symptoms at this time.  Patient is scheduled for neck surgery on March 6th  Fasting blood sugar ranges between 1 25-1 60  Last A1c around 8 something   Last visit to PCP November.  Patient is assisted by wife this visit.  Patient Active Problem List   Diagnosis Date Noted   Asthma 16/10/9602   Complication of anesthesia 10/22/2020   COPD (chronic obstructive pulmonary disease) ([place]) 10/22/2020   Diabetes mellitus without complication ([place]) 54/09/8117   Heart murmur 10/22/2020   Hypertension 10/22/2020   Hypothyroidism 10/22/2020   Pneumonia 10/22/2020   Protein in urine 10/22/2020   HNP (herniated nucleus pulposus), lumbar 01/07/2014   Current Outpatient Medications on File Prior to Visit  Medication Sig Dispense Refill   albuterol (PROVENTIL HFA;VENTOLIN HFA) 108 (90 BASE) MCG/ACT inhaler Inhale 2 puffs into the lungs every 6 (six) hours as needed. For shortness of breath     aspirin EC 81 MG tablet Take 81 mg by mouth daily.     Dulaglutide (TRULICITY) 1.47 WG/9.5AO SOPN Inject 75 mg into the skin once a week.     JANUMET XR 50-1000 MG TB24 Take 2 tablets by mouth daily.     losartan (COZAAR) 100 MG tablet Take 100 mg by mouth daily.     montelukast (SINGULAIR) 10 MG tablet Take 10 mg by mouth at bedtime.     nitroGLYCERIN (NITROSTAT) 0.4 MG SL tablet Place 1 tablet (0.4 mg total) under the tongue every 5 (five) minutes as needed. 30 tablet 3   ONETOUCH VERIO test strip USE 1  STRIP EVERY DAY 50 each 4   potassium chloride (MICRO-K) 10 MEQ CR capsule Take 10 mEq by mouth daily.     rosuvastatin (CRESTOR) 10 MG tablet Take 10 mg by mouth daily.     torsemide (DEMADEX) 20 MG tablet Take 1 tablet (20 mg total) by mouth once for 1 dose. 90 tablet 1   No current facility-administered medications on file prior to visit.   Allergies  Allergen Reactions   Other Anaphylaxis    Hazelnut NOELLE (sinus medication)   Penicillins Anaphylaxis   Sulfa Antibiotics Hives   Neosporin [Neomycin-Bacitracin Zn-Polymyx] Rash    REDNESS AROUND AREA APPLIED    Recent Results (from the past 2160 hour(s))  Basic metabolic panel     Status: Abnormal   Collection Time: 01/05/21  9:11 AM  Result Value Ref Range   Glucose 131 (H) 70 - 99 mg/dL   BUN 18 8 - 27 mg/dL   Creatinine, Ser 0.93 0.76 - 1.27 mg/dL   eGFR 91 >59 mL/min/1.73   BUN/Creatinine Ratio 19 10 - 24   Sodium 140 134 - 144 mmol/L   Potassium 5.1 3.5 - 5.2 mmol/L   Chloride 99 96 - 106 mmol/L   CO2 26 20 - 29 mmol/L   Calcium 10.0 8.6 - 10.2 mg/dL  Magnesium     Status: None   Collection Time: 01/05/21  9:11  AM  Result Value Ref Range   Magnesium 1.6 1.6 - 2.3 mg/dL  MYOCARDIAL PERFUSION IMAGING     Status: None   Collection Time: 01/15/21 11:18 AM  Result Value Ref Range   Rest HR 76.0 bpm   Rest BP 150/80 mmHg   Peak HR 110 bpm   Peak BP 144/80 mmHg   Rest Nuclear Isotope Dose 10.0 mCi   Stress Nuclear Isotope Dose 29.4 mCi   SSS 4.0    SRS 0.0    SDS 4.0    TID 0.96    LV sys vol 19.0 mL   LV dias vol 68.0 62 - 150 mL   Nuc Stress EF 71 %   ST Depression (mm) 0 mm    Objective: General: Patient is awake, alert, and oriented x 3 and in no acute distress.  Integument: Skin is warm, dry and supple bilateral. Nails are tender, long, thickened and dystrophic with subungual debris, consistent with onychomycosis, 1-5 bilateral.  There is a scaly rash noted to the right medial arch and also to the dorsal  aspect of the left third toe likely consistent with tinea.  No open lesions or preulcerative lesions present bilateral. Remaining integument unremarkable.  Vasculature:  Dorsalis Pedis pulse 1/4 bilateral. Posterior Tibial pulse 1/4 bilateral.  Varicosities noted bilateral. Capillary fill time <5 sec 1-5 bilateral.  Scant hair growth to the level of the digits.Temperature gradient within normal limits.  No significant edema noted bilateral.  Neurology: Vibratory sensation diminished bilateral.  Musculoskeletal: Asymptomatic pes planus deformity noted bilateral.  Range of motion appears to be acceptable.  Assessment and Plan: Problem List Items Addressed This Visit       Endocrine   Diabetes mellitus without complication ([place])   Other Visit Diagnoses     Pain due to onychomycosis of toenails of both feet    -  Primary   Relevant Medications   clotrimazole (LOTRIMIN) 1 % external solution   nystatin-triamcinolone ointment (MYCOLOG)   Tinea       Relevant Medications   clotrimazole (LOTRIMIN) 1 % external solution   nystatin-triamcinolone ointment (MYCOLOG)       -Examined patient. -Discussed and educated patient on diabetic foot care, especially with  regards to the vascular, neurological and musculoskeletal systems.  -Stressed the importance of good glycemic control and the detriment of not  controlling glucose levels in relation to the foot. -Mechanically debrided all nails 1-5 bilateral using sterile nail nipper and filed with dremel without incident  -Rx clotrimazole solution to use to toenails and to the skin adjacent to the nail at the left third toe for fungus/tinea -Rx Mycolog ointment for patient to use to the plantar surfaces of both feet and to the right arch for fungus/tinea -Encouraged good hygiene habits and changing socks frequently -Answered all patient questions -Patient to return  in 3 months for at risk foot care -Patient advised to call the office if any  problems or questions arise in the meantime.  Landis Martins, DPM

## 2021-03-26 ENCOUNTER — Other Ambulatory Visit (HOSPITAL_COMMUNITY): Payer: PPO

## 2021-03-27 ENCOUNTER — Ambulatory Visit: Payer: PPO | Admitting: Sports Medicine

## 2021-03-30 ENCOUNTER — Ambulatory Visit: Admit: 2021-03-30 | Payer: PPO | Admitting: Neurosurgery

## 2021-03-30 SURGERY — ANTERIOR CERVICAL DECOMPRESSION/DISCECTOMY FUSION 1 LEVEL
Anesthesia: General

## 2021-04-12 NOTE — Progress Notes (Signed)
?Cardiology Office Note:   ? ?Date:  04/13/2021  ? ?ID:  Scott Koch, DOB [birth year], MRN [id number] ? ?PCP:  Earlyne Iba, NP  ?Cardiologist:  Shirlee More, MD   ? ?Referring MD: Earlyne Iba, NP  ? ? ?ASSESSMENT:   ? ?1. Hypertensive heart disease with heart failure ([place])   ?2. Coronary artery disease of native artery of native heart with stable angina pectoris ([place])   ?3. Diabetes mellitus without complication ([place])   ?4. Chronic obstructive pulmonary disease, unspecified COPD type ([place])   ? ?PLAN:   ? ?In order of problems listed above: ? ?[name] is doing much better with torsemide symptoms of heart failure have resolved he has no fluid overload continue his diuretic check renal function proBNP and magnesium complaining of nighttime cramps ?Stable CAD New York Heart Association class I continue medical treatment including aspirin is high intensity statin ?Managed by his PCP ?Stable continue his bronchodilator ? ? ?Next appointment: 6 months 7 ? ? ?Medication Adjustments/Labs and Tests Ordered: ?Current medicines are reviewed at length with the patient today.  Concerns regarding medicines are outlined above.  ?No orders of the defined types were placed in this encounter. ? ?No orders of the defined types were placed in this encounter. ? ? ?Chief Complaint  ?Patient presents with  ? Follow-up  ? Congestive Heart Failure  ? Coronary Artery Disease  ? ? ?History of Present Illness:   ? ?Scott Koch is a 66 y.o. male with a hx of CAD with remote PCI and stent more than 20 years ago heart failure with normal ejection fraction COPD type 2 diabetes with nephropathy and hyperlipidemia last seen 01/05/2021.  His heart failure was compensated after transition to torsemide and no recurrent hypotension. ? ?Echocardiogram 12/11/2020 shows EF low normal 50 to 55% mild concentric LVH and grade 1 diastolic dysfunction.  Right ventricle is normal in size and function normal pulmonary artery pressure and no significant valvular  abnormality was noted. ? ?He was [facility] ED 12/04/2020 with what was described as intermittent epigastric abdominal discomfort radiating to the chest and associated with belching.  The symptoms were nonanginal.  High-sensitivity troponin initial and repeat were both normal chest x-ray showed no acute cardiopulmonary disease hemoglobin 17 creatinine 1.35 GFR 58 cc/min.  EKG independently reviewed by me showed sinus rhythm poor R wave progression otherwise normal. ? ?Compliance with diet, lifestyle and medications: Yes ? ?He is doing much better with torsemide weight is stable at home.  He has mild exertional shortness of breath does not occur during usual activities no edema orthopnea chest pain palpitation or syncope. ?Past Medical History:  ?Diagnosis Date  ? Asthma   ? inhaler used only when seasonal allergies   ? Complication of anesthesia   ? patient states"gets rowdy" when wake up  ? COPD (chronic obstructive pulmonary disease) ([place])   ? Diabetes mellitus without complication ([place])   ? fasting blood sugar avg 120  ? Heart murmur   ? Hypertension   ? Hypothyroidism   ? Pneumonia   ? hx  ? Protein in urine   ? Shortness of breath dyspnea   ? hx  ? ? ?Past Surgical History:  ?Procedure Laterality Date  ? ANTERIOR CERVICAL DECOMP/DISCECTOMY FUSION  01/13/2012  ? Procedure: ANTERIOR CERVICAL DECOMPRESSION/DISCECTOMY FUSION 3 LEVELS;  Surgeon: Elaina Hoops, MD;  Location: [facility];  Service: Neurosurgery;  Laterality: N/A;  Cervical three-four, cervical four five, cervical six-seven anterior cervical  decompression fusion with plate ?  ? CORONARY ANGIOPLASTY  2000  ? stent  ? SHOULDER ARTHROSCOPY Right 14  ? ? ?Current Medications: ?Current Meds  ?Medication Sig  ? albuterol (PROVENTIL HFA;VENTOLIN HFA) 108 (90 BASE) MCG/ACT inhaler Inhale 2 puffs into the lungs every 6 (six) hours as needed. For shortness of breath  ? aspirin EC 81 MG tablet Take 81 mg by mouth daily.  ? clotrimazole (LOTRIMIN)  1 % external solution Apply 1 application topically daily as needed. To toenails and to surrounding skin of toenails once daily  ? Dulaglutide (TRULICITY) 1.09 NA/3.5TD SOPN Inject 75 mg into the skin once a week.  ? JANUMET XR 50-1000 MG TB24 Take 2 tablets by mouth daily.  ? LANTUS SOLOSTAR 100 UNIT/ML Solostar Pen Inject 25 Units into the skin daily.  ? levothyroxine (SYNTHROID) 100 MCG tablet Take 100 mcg by mouth daily before breakfast.  ? loratadine (CLARITIN) 10 MG tablet Take 10 mg by mouth daily.  ? losartan (COZAAR) 25 MG tablet Take 25 mg by mouth daily.  ? montelukast (SINGULAIR) 10 MG tablet Take 10 mg by mouth at bedtime.  ? nitroGLYCERIN (NITROSTAT) 0.4 MG SL tablet Place 0.4 mg under the tongue every 5 (five) minutes as needed for chest pain.  ? nystatin-triamcinolone ointment (MYCOLOG) Apply 1 application topically 2 (two) times daily.  ? omeprazole (PRILOSEC) 20 MG capsule Take 20 mg by mouth daily.  ? ONETOUCH VERIO test strip USE 1 STRIP EVERY DAY  ? potassium chloride (MICRO-K) 10 MEQ CR capsule Take 10 mEq by mouth daily.  ? rosuvastatin (CRESTOR) 10 MG tablet Take 10 mg by mouth daily.  ? torsemide (DEMADEX) 20 MG tablet Take 20 mg by mouth daily.  ? Vitamin D, Ergocalciferol, (DRISDOL) 1.25 MG (50000 UNIT) CAPS capsule Take 50,000 Units by mouth every 7 (seven) days.  ?  ? ?Allergies:   Other, Penicillins, Sulfa antibiotics, and Neosporin [neomycin-bacitracin zn-polymyx]  ? ?Social History  ? ?Socioeconomic History  ? Marital status: Married  ?  Spouse name: Not on file  ? Number of children: Not on file  ? Years of education: Not on file  ? Highest education level: Not on file  ?Occupational History  ? Not on file  ?Tobacco Use  ? Smoking status: Former  ?  Packs/day: 1.00  ?  Years: 35.00  ?  Pack years: 35.00  ?  Types: Cigarettes  ?  Quit date: 12/03/2013  ?  Years since quitting: 7.3  ? Smokeless tobacco: Never  ? Tobacco comments:  ?  nicotine patch  ?Substance and Sexual Activity  ?  Alcohol use: No  ? Drug use: No  ? Sexual activity: Not on file  ?Other Topics Concern  ? Not on file  ?Social History Narrative  ? Not on file  ? ?Social Determinants of Health  ? ?Financial Resource Strain: Not on file  ?Food Insecurity: Not on file  ?Transportation Needs: Not on file  ?Physical Activity: Not on file  ?Stress: Not on file  ?Social Connections: Not on file  ?  ? ?Family History: ?The patient's family history includes Arthritis in his brother; Heart attack in his mother; Heart disease in his brother and mother; Hepatitis in his mother; Hypertension in his brother and mother; Jaundice in his mother; Prostate cancer in his father. ?ROS:   ?Please see the history of present illness.    ?All other systems reviewed and are negative. ? ?EKGs/Labs/Other Studies Reviewed:   ? ?The following  studies were reviewed today: ? ?Had a myocardial perfusion study 01/16/2021 showing normal left ventricular ejection fraction 71% and normal myocardial perfusion. ?Study Highlights ?  Findings are consistent with no  ischemia and no prior myocardial infarction. The study is low risk. ?  No ST deviation was noted. ?  Left ventricular function is normal. Nuclear stress EF: 71 %. The left ventricular ejection fraction is hyperdynamic (>65%). End diastolic cavity size is normal. ?  Prior study available for comparison from 11/29/2001. ?  Diaphragmatic attenuation noted. ? ? ?Recent Labs: ?12/04/2020: ALT 33; Hemoglobin 17.0; Platelets 375 ?01/05/2021: BUN 18; Creatinine, Ser 0.93; Magnesium 1.6; Potassium 5.1; Sodium 140  ?Recent Lipid Panel ?No results found for: CHOL, TRIG, HDL, CHOLHDL, VLDL, LDLCALC, LDLDIRECT ? ?Physical Exam:   ? ?VS:  BP 112/78 (BP Location: Right Arm, Patient Position: Sitting, Cuff Size: Normal)   Pulse 82   Ht 6' (1.829 m)   Wt 254 lb (115.2 kg)   SpO2 95%   BMI 34.45 kg/m?    ? ?Wt Readings from Last 3 Encounters:  ?04/13/21 254 lb (115.2 kg)  ?01/15/21 254 lb (115.2 kg)  ?01/05/21 254 lb  (115.2 kg)  ?  ? ?GEN:  Well nourished, well developed in no acute distress ?HEENT: Normal ?NECK: No JVD; No carotid bruits ?LYMPHATICS: No lymphadenopathy ?CARDIAC: RRR, no murmurs, rubs, gallops ?RESPIRATORY:

## 2021-04-13 ENCOUNTER — Encounter: Payer: Self-pay | Admitting: Cardiology

## 2021-04-13 ENCOUNTER — Other Ambulatory Visit: Payer: Self-pay

## 2021-04-13 ENCOUNTER — Ambulatory Visit: Payer: PPO | Admitting: Cardiology

## 2021-04-13 VITALS — BP 112/78 | HR 82 | Ht 72.0 in | Wt 254.0 lb

## 2021-04-13 DIAGNOSIS — I25118 Atherosclerotic heart disease of native coronary artery with other forms of angina pectoris: Secondary | ICD-10-CM

## 2021-04-13 DIAGNOSIS — E875 Hyperkalemia: Secondary | ICD-10-CM

## 2021-04-13 DIAGNOSIS — J449 Chronic obstructive pulmonary disease, unspecified: Secondary | ICD-10-CM | POA: Diagnosis not present

## 2021-04-13 DIAGNOSIS — E119 Type 2 diabetes mellitus without complications: Secondary | ICD-10-CM | POA: Diagnosis not present

## 2021-04-13 DIAGNOSIS — I110 Hypertensive heart disease with heart failure: Secondary | ICD-10-CM

## 2021-04-13 NOTE — Patient Instructions (Signed)
Medication Instructions:  ?Your physician has recommended you make the following change in your medication:  ?START: Take over the counter magnesium supplements 1-2 daily. ? ?*If you need a refill on your cardiac medications before your next appointment, please call your pharmacy* ? ? ?Lab Work: ?Your physician recommends that you have a BMP, magnesium and Pro BNP today.   ? ?If you have labs (blood work) drawn today and your tests are completely normal, you will receive your results only by: ?MyChart Message (if you have MyChart) OR ?A paper copy in the mail ?If you have any lab test that is abnormal or we need to change your treatment, we will call you to review the results. ? ? ?Testing/Procedures: ?None ? ? ?Follow-Up: ?At [facility], you and your health needs are our priority.  As part of our continuing mission to provide you with exceptional heart care, we have created designated Provider Care Teams.  These Care Teams include your primary Cardiologist (physician) and Advanced Practice Providers (APPs -  Physician Assistants and Nurse Practitioners) who all work together to provide you with the care you need, when you need it. ? ?We recommend signing up for the patient portal called "MyChart".  Sign up information is provided on this After Visit Summary.  MyChart is used to connect with patients for Virtual Visits (Telemedicine).  Patients are able to view lab/test results, encounter notes, upcoming appointments, etc.  Non-urgent messages can be sent to your provider as well.   ?To learn more about what you can do with MyChart, go to [email or web address].   ? ?Your next appointment:   ?6 month(s) ? ?The format for your next appointment:   ?In Person ? ?Provider:   ?Shirlee More, MD  ? ? ?Other Instructions ?NA  ?

## 2021-04-15 LAB — BASIC METABOLIC PANEL
BUN/Creatinine Ratio: 18 (ref 10–24)
BUN: 25 mg/dL (ref 8–27)
CO2: 16 mmol/L — ABNORMAL LOW (ref 20–29)
Calcium: 10.9 mg/dL — ABNORMAL HIGH (ref 8.6–10.2)
Chloride: 107 mmol/L — ABNORMAL HIGH (ref 96–106)
Creatinine, Ser: 1.37 mg/dL — ABNORMAL HIGH (ref 0.76–1.27)
Glucose: 111 mg/dL — ABNORMAL HIGH (ref 70–99)
Potassium: 5.8 mmol/L — ABNORMAL HIGH (ref 3.5–5.2)
Sodium: 139 mmol/L (ref 134–144)
eGFR: 57 mL/min/{1.73_m2} — ABNORMAL LOW (ref >59)

## 2021-04-15 LAB — MAGNESIUM: Magnesium: 1.6 mg/dL (ref 1.6–2.3)

## 2021-04-15 LAB — PRO B NATRIURETIC PEPTIDE: NT-Pro BNP: 48 pg/mL (ref 0–376)

## 2021-04-15 NOTE — Addendum Note (Signed)
Addended by: Truddie Hidden on: 04/15/2021 11:47 AM ? ? Modules accepted: Orders ? ?

## 2021-04-23 DIAGNOSIS — E875 Hyperkalemia: Secondary | ICD-10-CM | POA: Diagnosis not present

## 2021-04-24 LAB — BASIC METABOLIC PANEL
BUN/Creatinine Ratio: 19 (ref 10–24)
BUN: 21 mg/dL (ref 8–27)
CO2: 29 mmol/L (ref 20–29)
Calcium: 9.8 mg/dL (ref 8.6–10.2)
Chloride: 94 mmol/L — ABNORMAL LOW (ref 96–106)
Creatinine, Ser: 1.09 mg/dL (ref 0.76–1.27)
Glucose: 203 mg/dL — ABNORMAL HIGH (ref 70–99)
Potassium: 5.1 mmol/L (ref 3.5–5.2)
Sodium: 132 mmol/L — ABNORMAL LOW (ref 134–144)
eGFR: 74 mL/min/{1.73_m2} (ref >59)

## 2021-04-29 DIAGNOSIS — I509 Heart failure, unspecified: Secondary | ICD-10-CM | POA: Diagnosis not present

## 2021-04-29 DIAGNOSIS — D692 Other nonthrombocytopenic purpura: Secondary | ICD-10-CM | POA: Diagnosis not present

## 2021-04-29 DIAGNOSIS — Z6833 Body mass index (BMI) 33.0-33.9, adult: Secondary | ICD-10-CM | POA: Diagnosis not present

## 2021-04-29 DIAGNOSIS — E1159 Type 2 diabetes mellitus with other circulatory complications: Secondary | ICD-10-CM | POA: Diagnosis not present

## 2021-04-29 DIAGNOSIS — J449 Chronic obstructive pulmonary disease, unspecified: Secondary | ICD-10-CM | POA: Diagnosis not present

## 2021-04-29 DIAGNOSIS — E785 Hyperlipidemia, unspecified: Secondary | ICD-10-CM | POA: Diagnosis not present

## 2021-04-29 DIAGNOSIS — I10 Essential (primary) hypertension: Secondary | ICD-10-CM | POA: Diagnosis not present

## 2021-04-29 DIAGNOSIS — Z794 Long term (current) use of insulin: Secondary | ICD-10-CM | POA: Diagnosis not present

## 2021-04-29 DIAGNOSIS — Z79890 Hormone replacement therapy (postmenopausal): Secondary | ICD-10-CM | POA: Diagnosis not present

## 2021-04-29 DIAGNOSIS — E039 Hypothyroidism, unspecified: Secondary | ICD-10-CM | POA: Diagnosis not present

## 2021-04-29 DIAGNOSIS — Z720 Tobacco use: Secondary | ICD-10-CM | POA: Diagnosis not present

## 2021-04-29 DIAGNOSIS — Z7984 Long term (current) use of oral hypoglycemic drugs: Secondary | ICD-10-CM | POA: Diagnosis not present

## 2021-05-05 DIAGNOSIS — E119 Type 2 diabetes mellitus without complications: Secondary | ICD-10-CM | POA: Diagnosis not present

## 2021-05-07 DIAGNOSIS — Z6834 Body mass index (BMI) 34.0-34.9, adult: Secondary | ICD-10-CM | POA: Diagnosis not present

## 2021-05-07 DIAGNOSIS — Z125 Encounter for screening for malignant neoplasm of prostate: Secondary | ICD-10-CM | POA: Diagnosis not present

## 2021-05-07 DIAGNOSIS — I50810 Right heart failure, unspecified: Secondary | ICD-10-CM | POA: Diagnosis not present

## 2021-05-07 DIAGNOSIS — E785 Hyperlipidemia, unspecified: Secondary | ICD-10-CM | POA: Diagnosis not present

## 2021-05-07 DIAGNOSIS — E034 Atrophy of thyroid (acquired): Secondary | ICD-10-CM | POA: Diagnosis not present

## 2021-05-07 DIAGNOSIS — E782 Mixed hyperlipidemia: Secondary | ICD-10-CM | POA: Diagnosis not present

## 2021-05-07 DIAGNOSIS — R109 Unspecified abdominal pain: Secondary | ICD-10-CM | POA: Diagnosis not present

## 2021-05-07 DIAGNOSIS — E1169 Type 2 diabetes mellitus with other specified complication: Secondary | ICD-10-CM | POA: Diagnosis not present

## 2021-05-07 DIAGNOSIS — K9049 Malabsorption due to intolerance, not elsewhere classified: Secondary | ICD-10-CM | POA: Diagnosis not present

## 2021-05-07 DIAGNOSIS — J449 Chronic obstructive pulmonary disease, unspecified: Secondary | ICD-10-CM | POA: Diagnosis not present

## 2021-06-10 ENCOUNTER — Encounter: Payer: Self-pay | Admitting: Sports Medicine

## 2021-06-10 ENCOUNTER — Ambulatory Visit: Payer: PPO | Admitting: Sports Medicine

## 2021-06-10 DIAGNOSIS — E119 Type 2 diabetes mellitus without complications: Secondary | ICD-10-CM

## 2021-06-10 DIAGNOSIS — M79674 Pain in right toe(s): Secondary | ICD-10-CM

## 2021-06-10 DIAGNOSIS — B359 Dermatophytosis, unspecified: Secondary | ICD-10-CM

## 2021-06-10 DIAGNOSIS — M79675 Pain in left toe(s): Secondary | ICD-10-CM | POA: Diagnosis not present

## 2021-06-10 DIAGNOSIS — B351 Tinea unguium: Secondary | ICD-10-CM

## 2021-06-10 NOTE — Progress Notes (Signed)
Subjective: ?Scott Koch is a 66 y.o. male patient with history of diabetes who presents to office today complaining of long,mildly painful nails  while ambulating in shoes; unable to trim.  Patient reports that he is doing better since last visit. ? ?Fasting blood sugar ranges not recorded ? ?Last A1c around 8.5 ? ? ?Last visit to PCP [name], NP was 3 weeks ago ? ?Patient is assisted by wife this visit. ? ?Patient Active Problem List  ? Diagnosis Date Noted  ? Asthma 10/22/2020  ? Complication of anesthesia 10/22/2020  ? COPD (chronic obstructive pulmonary disease) ([place]) 10/22/2020  ? Diabetes mellitus without complication ([place]) 70/35/0093  ? Heart murmur 10/22/2020  ? Hypertension 10/22/2020  ? Hypothyroidism 10/22/2020  ? Pneumonia 10/22/2020  ? Protein in urine 10/22/2020  ? HNP (herniated nucleus pulposus), lumbar 01/07/2014  ? ?Current Outpatient Medications on File Prior to Visit  ?Medication Sig Dispense Refill  ? albuterol (PROVENTIL HFA;VENTOLIN HFA) 108 (90 BASE) MCG/ACT inhaler Inhale 2 puffs into the lungs every 6 (six) hours as needed. For shortness of breath    ? aspirin EC 81 MG tablet Take 81 mg by mouth daily.    ? clotrimazole (LOTRIMIN) 1 % external solution Apply 1 application topically daily as needed. To toenails and to surrounding skin of toenails once daily 60 mL 5  ? Dulaglutide (TRULICITY) 8.18 EX/9.3ZJ SOPN Inject 75 mg into the skin once a week.    ? JANUMET XR 50-1000 MG TB24 Take 2 tablets by mouth daily.    ? LANTUS SOLOSTAR 100 UNIT/ML Solostar Pen Inject 25 Units into the skin daily.    ? levothyroxine (SYNTHROID) 100 MCG tablet Take 100 mcg by mouth daily before breakfast.    ? loratadine (CLARITIN) 10 MG tablet Take 10 mg by mouth daily.    ? losartan (COZAAR) 25 MG tablet Take 25 mg by mouth daily.    ? montelukast (SINGULAIR) 10 MG tablet Take 10 mg by mouth at bedtime.    ? nitroGLYCERIN (NITROSTAT) 0.4 MG SL tablet Place 0.4 mg under the tongue every 5 (five) minutes  as needed for chest pain.    ? nystatin-triamcinolone ointment (MYCOLOG) Apply 1 application topically 2 (two) times daily. 30 g 0  ? omeprazole (PRILOSEC) 20 MG capsule Take 20 mg by mouth daily.    ? ONETOUCH VERIO test strip USE 1 STRIP EVERY DAY 50 each 4  ? rosuvastatin (CRESTOR) 10 MG tablet Take 10 mg by mouth daily.    ? torsemide (DEMADEX) 20 MG tablet Take 20 mg by mouth daily.    ? Vitamin D, Ergocalciferol, (DRISDOL) 1.25 MG (50000 UNIT) CAPS capsule Take 50,000 Units by mouth every 7 (seven) days.    ? ?No current facility-administered medications on file prior to visit.  ? ?Allergies  ?Allergen Reactions  ? Other Anaphylaxis  ?  Hazelnut ?NOELLE (sinus medication)  ? Penicillins Anaphylaxis  ? Sulfa Antibiotics Hives  ? Neosporin [Neomycin-Bacitracin Zn-Polymyx] Rash  ?  REDNESS AROUND AREA APPLIED  ? ? ?Recent Results (from the past 2160 hour(s))  ?Basic Metabolic Panel (BMET)     Status: Abnormal  ? Collection Time: 04/13/21  3:49 PM  ?Result Value Ref Range  ? Glucose 111 (H) 70 - 99 mg/dL  ? BUN 25 8 - 27 mg/dL  ? Creatinine, Ser 1.37 (H) 0.76 - 1.27 mg/dL  ? eGFR 57 (L) >59 mL/min/1.73  ? BUN/Creatinine Ratio 18 10 - 24  ? Sodium 139 134 -  144 mmol/L  ? Potassium 5.8 (H) 3.5 - 5.2 mmol/L  ? Chloride 107 (H) 96 - 106 mmol/L  ? CO2 16 (L) 20 - 29 mmol/L  ? Calcium 10.9 (H) 8.6 - 10.2 mg/dL  ?Magnesium     Status: None  ? Collection Time: 04/13/21  3:49 PM  ?Result Value Ref Range  ? Magnesium 1.6 1.6 - 2.3 mg/dL  ?Pro b natriuretic peptide     Status: None  ? Collection Time: 04/13/21  3:49 PM  ?Result Value Ref Range  ? NT-Pro BNP 48 0 - 376 pg/mL  ?  Comment: The following cut-points have been suggested for the ?use of proBNP for the diagnostic evaluation of heart ?failure (HF) in patients with acute dyspnea: ?Modality                     Age           Optimal Cut ?                           (years)            Point ?------------------------------------------------------ ?Diagnosis (rule in HF)         <50            450 pg/mL ?                          50 - 75            900 pg/mL ?                              >75           1800 pg/mL ?Exclusion (rule out HF)  Age independent     300 pg/mL ?  ?Basic metabolic panel     Status: Abnormal  ? Collection Time: 04/23/21 10:12 AM  ?Result Value Ref Range  ? Glucose 203 (H) 70 - 99 mg/dL  ? BUN 21 8 - 27 mg/dL  ? Creatinine, Ser 1.09 0.76 - 1.27 mg/dL  ? eGFR 74 >59 mL/min/1.73  ? BUN/Creatinine Ratio 19 10 - 24  ? Sodium 132 (L) 134 - 144 mmol/L  ? Potassium 5.1 3.5 - 5.2 mmol/L  ? Chloride 94 (L) 96 - 106 mmol/L  ? CO2 29 20 - 29 mmol/L  ? Calcium 9.8 8.6 - 10.2 mg/dL  ? ? ?Objective: ?General: Patient is awake, alert, and oriented x 3 and in no acute distress. ? ?Integument: Skin is warm, dry and supple bilateral. Nails are tender, long, thickened and dystrophic with subungual debris, consistent with onychomycosis, 1-5 bilateral.  There is a scaly rash noted to the right medial arch and also to the dorsal aspect of the left third toe likely consistent with tinea.  No open lesions or preulcerative lesions present bilateral. Remaining integument unremarkable. ? ?Vasculature:  Dorsalis Pedis pulse 1/4 bilateral. Posterior Tibial pulse 1/4 bilateral.  Varicosities noted bilateral. ?Capillary fill time <5 sec 1-5 bilateral.  Scant hair growth to the level of the digits.Temperature gradient within normal limits.  No significant edema noted bilateral. ? ?Neurology: Vibratory sensation diminished bilateral. ? ?Musculoskeletal: Asymptomatic pes planus deformity noted bilateral.  Range of motion appears to be acceptable. ? ?Assessment and Plan: ?Problem List Items Addressed This Visit   ? ?  ? Endocrine  ? Diabetes mellitus  without complication ([place])  ? ?Other Visit Diagnoses   ? ? Pain due to onychomycosis of toenails of both feet    -  Primary  ? Tinea      ? ?  ? ? ?-Examined patient. ?-Discussed and educated patient on diabetic foot care ?-Mechanically debrided all  painful nails 1-5 bilateral in length and girth using sterile nail nipper and filed mycotic thickness with dremel without incident  ?-Continue with clotrimazole solution to use to toenails and to the skin adjacent to the nail at the left third toe for fungus/tinea ?-Continue with Mycolog ointment for patient to use to the plantar surfaces of both feet and to the right arch for fungus/tinea ?-Answered all patient questions ?-Patient to return  in 3 months for at risk foot care ?-Patient advised to call the office if any problems or questions arise in the meantime. ? ?Landis Martins, DPM ?

## 2021-06-30 DIAGNOSIS — N5089 Other specified disorders of the male genital organs: Secondary | ICD-10-CM | POA: Diagnosis not present

## 2021-06-30 DIAGNOSIS — R1030 Lower abdominal pain, unspecified: Secondary | ICD-10-CM | POA: Diagnosis not present

## 2021-06-30 DIAGNOSIS — Z6833 Body mass index (BMI) 33.0-33.9, adult: Secondary | ICD-10-CM | POA: Diagnosis not present

## 2021-07-03 DIAGNOSIS — N442 Benign cyst of testis: Secondary | ICD-10-CM | POA: Diagnosis not present

## 2021-07-03 DIAGNOSIS — N433 Hydrocele, unspecified: Secondary | ICD-10-CM | POA: Diagnosis not present

## 2021-07-03 DIAGNOSIS — R1030 Lower abdominal pain, unspecified: Secondary | ICD-10-CM | POA: Diagnosis not present

## 2021-07-03 DIAGNOSIS — R109 Unspecified abdominal pain: Secondary | ICD-10-CM | POA: Diagnosis not present

## 2021-07-03 DIAGNOSIS — K5730 Diverticulosis of large intestine without perforation or abscess without bleeding: Secondary | ICD-10-CM | POA: Diagnosis not present

## 2021-07-03 DIAGNOSIS — N503 Cyst of epididymis: Secondary | ICD-10-CM | POA: Diagnosis not present

## 2021-07-03 DIAGNOSIS — N5089 Other specified disorders of the male genital organs: Secondary | ICD-10-CM | POA: Diagnosis not present

## 2021-07-03 DIAGNOSIS — N400 Enlarged prostate without lower urinary tract symptoms: Secondary | ICD-10-CM | POA: Diagnosis not present

## 2021-07-03 DIAGNOSIS — K760 Fatty (change of) liver, not elsewhere classified: Secondary | ICD-10-CM | POA: Diagnosis not present

## 2021-07-31 DIAGNOSIS — Z0289 Encounter for other administrative examinations: Secondary | ICD-10-CM | POA: Diagnosis not present

## 2021-08-05 DIAGNOSIS — Z8042 Family history of malignant neoplasm of prostate: Secondary | ICD-10-CM | POA: Diagnosis not present

## 2021-08-05 DIAGNOSIS — N433 Hydrocele, unspecified: Secondary | ICD-10-CM | POA: Diagnosis not present

## 2021-08-05 DIAGNOSIS — N401 Enlarged prostate with lower urinary tract symptoms: Secondary | ICD-10-CM | POA: Diagnosis not present

## 2021-08-20 DIAGNOSIS — K219 Gastro-esophageal reflux disease without esophagitis: Secondary | ICD-10-CM | POA: Diagnosis not present

## 2021-08-20 DIAGNOSIS — I50810 Right heart failure, unspecified: Secondary | ICD-10-CM | POA: Diagnosis not present

## 2021-08-20 DIAGNOSIS — Z6833 Body mass index (BMI) 33.0-33.9, adult: Secondary | ICD-10-CM | POA: Diagnosis not present

## 2021-08-20 DIAGNOSIS — Z139 Encounter for screening, unspecified: Secondary | ICD-10-CM | POA: Diagnosis not present

## 2021-08-20 DIAGNOSIS — E1169 Type 2 diabetes mellitus with other specified complication: Secondary | ICD-10-CM | POA: Diagnosis not present

## 2021-08-20 DIAGNOSIS — E034 Atrophy of thyroid (acquired): Secondary | ICD-10-CM | POA: Diagnosis not present

## 2021-08-20 DIAGNOSIS — E785 Hyperlipidemia, unspecified: Secondary | ICD-10-CM | POA: Diagnosis not present

## 2021-08-20 DIAGNOSIS — J449 Chronic obstructive pulmonary disease, unspecified: Secondary | ICD-10-CM | POA: Diagnosis not present

## 2021-08-20 DIAGNOSIS — E1121 Type 2 diabetes mellitus with diabetic nephropathy: Secondary | ICD-10-CM | POA: Diagnosis not present

## 2021-08-20 DIAGNOSIS — E782 Mixed hyperlipidemia: Secondary | ICD-10-CM | POA: Diagnosis not present

## 2021-08-20 DIAGNOSIS — E559 Vitamin D deficiency, unspecified: Secondary | ICD-10-CM | POA: Diagnosis not present

## 2021-09-07 ENCOUNTER — Encounter: Payer: Self-pay | Admitting: Podiatry

## 2021-09-07 ENCOUNTER — Ambulatory Visit: Payer: PPO | Admitting: Podiatry

## 2021-09-07 DIAGNOSIS — M79675 Pain in left toe(s): Secondary | ICD-10-CM

## 2021-09-07 DIAGNOSIS — E119 Type 2 diabetes mellitus without complications: Secondary | ICD-10-CM

## 2021-09-07 DIAGNOSIS — M79674 Pain in right toe(s): Secondary | ICD-10-CM

## 2021-09-07 DIAGNOSIS — B351 Tinea unguium: Secondary | ICD-10-CM | POA: Diagnosis not present

## 2021-09-07 NOTE — Progress Notes (Signed)
Subjective: Scott Koch is a 66 y.o. male patient with history of diabetes who presents to office today complaining of long,mildly painful nails  while ambulating in shoes; unable to trim.  Patient reports that he is doing better since last visit.   Last A1c around 8.5    PCP [name], Georgeann Oppenheim, NP   Patient is assisted by wife this visit.  Patient Active Problem List   Diagnosis Date Noted   Asthma 63/14/9702   Complication of anesthesia 10/22/2020   COPD (chronic obstructive pulmonary disease) ([place]) 10/22/2020   Diabetes mellitus without complication ([place]) 63/78/5885   Heart murmur 10/22/2020   Hypertension 10/22/2020   Hypothyroidism 10/22/2020   Pneumonia 10/22/2020   Protein in urine 10/22/2020   HNP (herniated nucleus pulposus), lumbar 01/07/2014   Current Outpatient Medications on File Prior to Visit  Medication Sig Dispense Refill   albuterol (PROVENTIL HFA;VENTOLIN HFA) 108 (90 BASE) MCG/ACT inhaler Inhale 2 puffs into the lungs every 6 (six) hours as needed. For shortness of breath     aspirin EC 81 MG tablet Take 81 mg by mouth daily.     clotrimazole (LOTRIMIN) 1 % external solution Apply 1 application topically daily as needed. To toenails and to surrounding skin of toenails once daily 60 mL 5   Dulaglutide (TRULICITY) 0.27 XA/1.2IN SOPN Inject 75 mg into the skin once a week.     JANUMET XR 50-1000 MG TB24 Take 2 tablets by mouth daily.     LANTUS SOLOSTAR 100 UNIT/ML Solostar Pen Inject 25 Units into the skin daily.     levothyroxine (SYNTHROID) 100 MCG tablet Take 100 mcg by mouth daily before breakfast.     loratadine (CLARITIN) 10 MG tablet Take 10 mg by mouth daily.     losartan (COZAAR) 25 MG tablet Take 25 mg by mouth daily.     montelukast (SINGULAIR) 10 MG tablet Take 10 mg by mouth at bedtime.     nitroGLYCERIN (NITROSTAT) 0.4 MG SL tablet Place 0.4 mg under the tongue every 5 (five) minutes as needed for chest pain.     nystatin-triamcinolone ointment  (MYCOLOG) Apply 1 application topically 2 (two) times daily. 30 g 0   omeprazole (PRILOSEC) 20 MG capsule Take 20 mg by mouth daily.     ONETOUCH VERIO test strip USE 1 STRIP EVERY DAY 50 each 4   rosuvastatin (CRESTOR) 10 MG tablet Take 10 mg by mouth daily.     torsemide (DEMADEX) 20 MG tablet Take 20 mg by mouth daily.     Vitamin D, Ergocalciferol, (DRISDOL) 1.25 MG (50000 UNIT) CAPS capsule Take 50,000 Units by mouth every 7 (seven) days.     No current facility-administered medications on file prior to visit.   Allergies  Allergen Reactions   Other Anaphylaxis    Hazelnut NOELLE (sinus medication)   Penicillins Anaphylaxis   Sulfa Antibiotics Hives   Neosporin [Neomycin-Bacitracin Zn-Polymyx] Rash    REDNESS AROUND AREA APPLIED    No results found for this or any previous visit (from the past 2160 hour(s)).   Objective: General: Patient is awake, alert, and oriented x 3 and in no acute distress.  Integument: Skin is warm, dry and supple bilateral. Nails are tender, long, thickened and dystrophic with subungual debris, consistent with onychomycosis, 1-5 bilateral.  There is a scaly rash noted to the right medial arch and also to the dorsal aspect of the left third toe likely consistent with tinea.  No open lesions or preulcerative lesions present  bilateral. Remaining integument unremarkable.  Vasculature:  Dorsalis Pedis pulse 1/4 bilateral. Posterior Tibial pulse 1/4 bilateral.  Varicosities noted bilateral. Capillary fill time <5 sec 1-5 bilateral.  Scant hair growth to the level of the digits.Temperature gradient within normal limits.  No significant edema noted bilateral.  Neurology: Vibratory sensation diminished bilateral.  Musculoskeletal: Asymptomatic pes planus deformity noted bilateral.  Range of motion appears to be acceptable.  Assessment and Plan: Problem List Items Addressed This Visit   None    -Examined patient. -Discussed and educated patient on  diabetic foot care -Mechanically debrided all painful nails 1-5 bilateral in length and girth using sterile nail nipper and filed mycotic thickness with dremel without incident  -Continue with clotrimazole solution to use to toenails and to the skin adjacent to the nail at the left third toe for fungus/tinea -Answered all patient questions -Patient to return  in 3 months for at risk foot care -Patient advised to call the office if any problems or questions arise in the meantime.  Lorenda Peck, DPM

## 2021-10-17 ENCOUNTER — Other Ambulatory Visit: Payer: Self-pay | Admitting: Legal Medicine

## 2021-10-18 NOTE — Progress Notes (Unsigned)
Cardiology Office Note:    Date:  10/20/2021   ID:  Scott Koch, DOB 1955/11/29, MRN [id number]  PCP:  Earlyne Iba, NP  Cardiologist:  Shirlee More, MD    Referring MD: Earlyne Iba, NP    ASSESSMENT:    1. Coronary artery disease of native artery of native heart with stable angina pectoris ([place])   2. Hypertensive heart disease with heart failure ([place])   3. Mixed hyperlipidemia    PLAN:    In order of problems listed above:  [name] continues to do well from a cardiology perspective stable CAD having no anginal discomfort heart failure compensated blood pressures ideal 876 systolic his lipids are treated with LDL at target and he is adopted good lifestyle changes for his diabetes and unfortunately remains poorly controlled For CAD continue aspirin and his high intensity statin For heart failure and hypertension continue his loop diuretic and ARB with his diabetes blood pressure is at target and strongly encouraged him to continue his activity program goal of 8500 steps a day LDL is at target he will continue his high intensity statin has been remarkably effective His diabetes is poorly controlled following with his family doctor   Next appointment: 9 months   Medication Adjustments/Labs and Tests Ordered: Current medicines are reviewed at length with the patient today.  Concerns regarding medicines are outlined above.  No orders of the defined types were placed in this encounter.  No orders of the defined types were placed in this encounter.   Chief Complaint  Patient presents with   Hypertension   Congestive Heart Failure   Coronary Artery Disease    History of Present Illness:    Scott Koch is a 66 y.o. male with a hx of CAD with remote PCI and stent more than 20 years ago chronic diastolic heart failure COPD type 2 diabetes with nephropathy and hyperlipidemia.  He was last seen 04/13/2021.  Echocardiogram 12/11/2020 shows EF low normal 50 to 55% mild  concentric LVH and grade 1 diastolic dysfunction.  Right ventricle is normal in size and function normal pulmonary artery pressure and no significant valvular abnormality was noted.   He was seen at [facility] ED 12/04/2020 with what was described as intermittent epigastric abdominal discomfort radiating to the chest and associated with belching.  The symptoms were nonanginal.  High-sensitivity troponin initial and repeat were both normal chest x-ray showed no acute cardiopulmonary disease hemoglobin 17 creatinine 1.35 GFR 58 cc/min.  EKG independently reviewed by me showed sinus rhythm poor R wave progression otherwise normal.  Compliance with diet, lifestyle and medications: Yes  His wife is present she really is a big help the 2 of them walk every day they are going to track steps meticulous with diet and because of all this he feels much better He has no edema shortness of breath orthopnea he has had no chest pain palpitation or syncope. Unfortunately his diabetes remains very poorly controlled his fasting blood sugars are running 200 or greater and his last A1c 11.2 08/20/2021 Cholesterol 132 LDL at target 63 hemoglobin normal 14.5 and creatinine and potassium are normal 1.08 and 4.6   Past Medical History:  Diagnosis Date   Asthma    inhaler used only when seasonal allergies    Complication of anesthesia    patient states"gets rowdy" when wake up   COPD (chronic obstructive pulmonary disease) ([place])    Diabetes mellitus without complication ([place])    fasting blood sugar avg  120   Heart murmur    Hypertension    Hypothyroidism    Pneumonia    hx   Protein in urine    Shortness of breath dyspnea    hx    Past Surgical History:  Procedure Laterality Date   ANTERIOR CERVICAL DECOMP/DISCECTOMY FUSION  01/13/2012   Procedure: ANTERIOR CERVICAL DECOMPRESSION/DISCECTOMY FUSION 3 LEVELS;  Surgeon: Elaina Hoops, MD;  Location: [place] [facility];  Service: Neurosurgery;  Laterality: N/A;   Cervical three-four, cervical four five, cervical six-seven anterior cervical decompression fusion with plate    CORONARY ANGIOPLASTY  2000   stent   SHOULDER ARTHROSCOPY Right 14    Current Medications: Current Meds  Medication Sig   albuterol (PROVENTIL HFA;VENTOLIN HFA) 108 (90 BASE) MCG/ACT inhaler Inhale 2 puffs into the lungs every 6 (six) hours as needed. For shortness of breath   aspirin EC 81 MG tablet Take 81 mg by mouth daily.   Dulaglutide 3 MG/0.5ML SOPN Inject 3 mg into the skin once a week.   JANUMET XR 50-1000 MG TB24 Take 2 tablets by mouth daily.   LANTUS SOLOSTAR 100 UNIT/ML Solostar Pen Inject 25 Units into the skin daily.   levothyroxine (SYNTHROID) 100 MCG tablet Take 100 mcg by mouth daily before breakfast.   loratadine (CLARITIN) 10 MG tablet Take 10 mg by mouth daily.   losartan (COZAAR) 25 MG tablet Take 25 mg by mouth daily.   montelukast (SINGULAIR) 10 MG tablet Take 10 mg by mouth at bedtime.   nitroGLYCERIN (NITROSTAT) 0.4 MG SL tablet Place 0.4 mg under the tongue every 5 (five) minutes as needed for chest pain.   omeprazole (PRILOSEC) 20 MG capsule Take 20 mg by mouth daily.   ONETOUCH VERIO test strip USE 1 STRIP EVERY DAY   rosuvastatin (CRESTOR) 10 MG tablet Take 10 mg by mouth daily.   tamsulosin (FLOMAX) 0.4 MG CAPS capsule Take 0.4 mg by mouth once a week.   torsemide (DEMADEX) 20 MG tablet Take 20 mg by mouth daily.   Vitamin D, Ergocalciferol, (DRISDOL) 1.25 MG (50000 UNIT) CAPS capsule Take 50,000 Units by mouth every 7 (seven) days.     Allergies:   Other, Penicillins, Sulfa antibiotics, and Neosporin [neomycin-bacitracin zn-polymyx]   Social History   Socioeconomic History   Marital status: Married    Spouse name: Not on file   Number of children: Not on file   Years of education: Not on file   Highest education level: Not on file  Occupational History   Not on file  Tobacco Use   Smoking status: Former    Packs/day: 1.00     Years: 35.00    Total pack years: 35.00    Types: Cigarettes    Quit date: 12/03/2013    Years since quitting: 7.8   Smokeless tobacco: Never   Tobacco comments:    nicotine patch  Substance and Sexual Activity   Alcohol use: No   Drug use: No   Sexual activity: Not on file  Other Topics Concern   Not on file  Social History Narrative   Not on file   Social Determinants of Health   Financial Resource Strain: Not on file  Food Insecurity: Not on file  Transportation Needs: Not on file  Physical Activity: Not on file  Stress: Not on file  Social Connections: Not on file     Family History: The patient's family history includes Arthritis in his brother; Heart attack in his mother; Heart  disease in his brother and mother; Hepatitis in his mother; Hypertension in his brother and mother; Jaundice in his mother; Prostate cancer in his father. ROS:   Please see the history of present illness.    All other systems reviewed and are negative.  EKGs/Labs/Other Studies Reviewed:    The following studies were reviewed today:  Recent Labs: 12/04/2020: ALT 33; Hemoglobin 17.0; Platelets 375 04/13/2021: Magnesium 1.6; NT-Pro BNP 48 04/23/2021: BUN 21; Creatinine, Ser 1.09; Potassium 5.1; Sodium 132  Recent Lipid Panel No results found for: "CHOL", "TRIG", "HDL", "CHOLHDL", "VLDL", "LDLCALC", "LDLDIRECT"  Physical Exam:    VS:  BP 120/68 (BP Location: Right Arm, Patient Position: Sitting)   Pulse 76   Ht 6' (1.829 m)   Wt 243 lb 9.6 oz (110.5 kg)   SpO2 96%   BMI 33.04 kg/m     Wt Readings from Last 3 Encounters:  10/20/21 243 lb 9.6 oz (110.5 kg)  04/13/21 254 lb (115.2 kg)  01/15/21 254 lb (115.2 kg)     GEN:  Well nourished, well developed in no acute distress HEENT: Normal NECK: No JVD; No carotid bruits LYMPHATICS: No lymphadenopathy CARDIAC: RRR, no murmurs, rubs, gallops RESPIRATORY:  Clear to auscultation without rales, wheezing or rhonchi  ABDOMEN: Soft,  non-tender, non-distended MUSCULOSKELETAL:  No edema; No deformity  SKIN: Warm and dry NEUROLOGIC:  Alert and oriented x 3 PSYCHIATRIC:  Normal affect    Signed, Shirlee More, MD  10/20/2021 8:18 AM    [place]

## 2021-10-20 ENCOUNTER — Encounter: Payer: Self-pay | Admitting: Cardiology

## 2021-10-20 ENCOUNTER — Ambulatory Visit: Payer: PPO | Attending: Cardiology | Admitting: Cardiology

## 2021-10-20 VITALS — BP 120/68 | HR 76 | Ht 72.0 in | Wt 243.6 lb

## 2021-10-20 DIAGNOSIS — I110 Hypertensive heart disease with heart failure: Secondary | ICD-10-CM | POA: Diagnosis not present

## 2021-10-20 DIAGNOSIS — I25118 Atherosclerotic heart disease of native coronary artery with other forms of angina pectoris: Secondary | ICD-10-CM

## 2021-10-20 DIAGNOSIS — E782 Mixed hyperlipidemia: Secondary | ICD-10-CM

## 2021-10-20 NOTE — Patient Instructions (Addendum)
Medication Instructions:  Your physician recommends that you continue on your current medications as directed. Please refer to the Current Medication list given to you today.  *If you need a refill on your cardiac medications before your next appointment, please call your pharmacy*   Lab Work: None If you have labs (blood work) drawn today and your tests are completely normal, you will receive your results only by: [place] (if you have MyChart) OR A paper copy in the mail If you have any lab test that is abnormal or we need to change your treatment, we will call you to review the results.   Testing/Procedures: None   Follow-Up: At [facility], you and your health needs are our priority.  As part of our continuing mission to provide you with exceptional heart care, we have created designated Provider Care Teams.  These Care Teams include your primary Cardiologist (physician) and Advanced Practice Providers (APPs -  Physician Assistants and Nurse Practitioners) who all work together to provide you with the care you need, when you need it.  We recommend signing up for the patient portal called "MyChart".  Sign up information is provided on this After Visit Summary.  MyChart is used to connect with patients for Virtual Visits (Telemedicine).  Patients are able to view lab/test results, encounter notes, upcoming appointments, etc.  Non-urgent messages can be sent to your provider as well.   To learn more about what you can do with MyChart, go to [email or web address].    Your next appointment:   9 month(s)  The format for your next appointment:   In Person  Provider:   Shirlee More, MD    Other Instructions: Goal of activity 2500 - 8500 steps a day  Important Information About Sugar

## 2021-10-26 DIAGNOSIS — Z6831 Body mass index (BMI) 31.0-31.9, adult: Secondary | ICD-10-CM | POA: Diagnosis not present

## 2021-10-26 DIAGNOSIS — Z7985 Long-term (current) use of injectable non-insulin antidiabetic drugs: Secondary | ICD-10-CM | POA: Diagnosis not present

## 2021-10-26 DIAGNOSIS — Z7984 Long term (current) use of oral hypoglycemic drugs: Secondary | ICD-10-CM | POA: Diagnosis not present

## 2021-10-26 DIAGNOSIS — I10 Essential (primary) hypertension: Secondary | ICD-10-CM | POA: Diagnosis not present

## 2021-10-26 DIAGNOSIS — E119 Type 2 diabetes mellitus without complications: Secondary | ICD-10-CM | POA: Diagnosis not present

## 2021-10-26 DIAGNOSIS — Z794 Long term (current) use of insulin: Secondary | ICD-10-CM | POA: Diagnosis not present

## 2021-11-13 DIAGNOSIS — N433 Hydrocele, unspecified: Secondary | ICD-10-CM | POA: Diagnosis not present

## 2021-11-13 DIAGNOSIS — N401 Enlarged prostate with lower urinary tract symptoms: Secondary | ICD-10-CM | POA: Diagnosis not present

## 2021-11-13 DIAGNOSIS — Z8042 Family history of malignant neoplasm of prostate: Secondary | ICD-10-CM | POA: Diagnosis not present

## 2021-11-23 DIAGNOSIS — Z6831 Body mass index (BMI) 31.0-31.9, adult: Secondary | ICD-10-CM | POA: Diagnosis not present

## 2021-11-23 DIAGNOSIS — E034 Atrophy of thyroid (acquired): Secondary | ICD-10-CM | POA: Diagnosis not present

## 2021-11-23 DIAGNOSIS — E782 Mixed hyperlipidemia: Secondary | ICD-10-CM | POA: Diagnosis not present

## 2021-11-23 DIAGNOSIS — E538 Deficiency of other specified B group vitamins: Secondary | ICD-10-CM | POA: Diagnosis not present

## 2021-11-23 DIAGNOSIS — E1169 Type 2 diabetes mellitus with other specified complication: Secondary | ICD-10-CM | POA: Diagnosis not present

## 2021-11-23 DIAGNOSIS — E785 Hyperlipidemia, unspecified: Secondary | ICD-10-CM | POA: Diagnosis not present

## 2021-11-23 DIAGNOSIS — I50810 Right heart failure, unspecified: Secondary | ICD-10-CM | POA: Diagnosis not present

## 2021-11-23 DIAGNOSIS — E1121 Type 2 diabetes mellitus with diabetic nephropathy: Secondary | ICD-10-CM | POA: Diagnosis not present

## 2021-11-23 DIAGNOSIS — J449 Chronic obstructive pulmonary disease, unspecified: Secondary | ICD-10-CM | POA: Diagnosis not present

## 2021-11-29 DIAGNOSIS — S29011A Strain of muscle and tendon of front wall of thorax, initial encounter: Secondary | ICD-10-CM | POA: Diagnosis not present

## 2021-11-29 DIAGNOSIS — R0781 Pleurodynia: Secondary | ICD-10-CM | POA: Diagnosis not present

## 2021-11-29 DIAGNOSIS — R079 Chest pain, unspecified: Secondary | ICD-10-CM | POA: Diagnosis not present

## 2021-11-29 DIAGNOSIS — I10 Essential (primary) hypertension: Secondary | ICD-10-CM | POA: Diagnosis not present

## 2021-11-29 DIAGNOSIS — E119 Type 2 diabetes mellitus without complications: Secondary | ICD-10-CM | POA: Diagnosis not present

## 2021-11-29 DIAGNOSIS — R059 Cough, unspecified: Secondary | ICD-10-CM | POA: Diagnosis not present

## 2021-12-11 ENCOUNTER — Ambulatory Visit: Payer: PPO | Admitting: Podiatry

## 2021-12-11 DIAGNOSIS — M79674 Pain in right toe(s): Secondary | ICD-10-CM

## 2021-12-11 DIAGNOSIS — B351 Tinea unguium: Secondary | ICD-10-CM

## 2021-12-11 DIAGNOSIS — M79675 Pain in left toe(s): Secondary | ICD-10-CM

## 2021-12-11 DIAGNOSIS — E119 Type 2 diabetes mellitus without complications: Secondary | ICD-10-CM

## 2021-12-11 NOTE — Progress Notes (Signed)
  Subjective:  Patient ID: Jaquille Kau, male    DOB: 1955/12/19,  MRN: [id number]  No chief complaint on file.   66 y.o. male presents with the above complaint. History confirmed with patient. Patient presenting with pain related to dystrophic thickened elongated nails. Patient is unable to trim own nails related to nail dystrophy and/or mobility issues. Patient does have a history of T2DM.   Objective:  Physical Exam: warm, good capillary refill nail exam onychomycosis of the toenails, onycholysis, and dystrophic nails DP pulses palpable, PT pulses palpable, and protective sensation intact Left Foot:  Pain with palpation of nails due to elongation and dystrophic growth.  Right Foot: Pain with palpation of nails due to elongation and dystrophic growth.   Assessment:   1. Pain due to onychomycosis of toenails of both feet   2. Diabetes mellitus without complication ([place])      Plan:  Patient was evaluated and treated and all questions answered.   #Onychomycosis with pain  -Nails palliatively debrided as below. -Educated on self-care  Procedure: Nail Debridement Rationale: Pain Type of Debridement: manual, sharp debridement. Instrumentation: Nail nipper, rotary burr. Number of Nails: 10  Return in about 3 months (around 03/13/2022) for [facility].         Everitt Amber, DPM [facility]

## 2022-02-24 DIAGNOSIS — K219 Gastro-esophageal reflux disease without esophagitis: Secondary | ICD-10-CM | POA: Diagnosis not present

## 2022-02-24 DIAGNOSIS — E1121 Type 2 diabetes mellitus with diabetic nephropathy: Secondary | ICD-10-CM | POA: Diagnosis not present

## 2022-02-24 DIAGNOSIS — E559 Vitamin D deficiency, unspecified: Secondary | ICD-10-CM | POA: Diagnosis not present

## 2022-02-24 DIAGNOSIS — I50810 Right heart failure, unspecified: Secondary | ICD-10-CM | POA: Diagnosis not present

## 2022-02-24 DIAGNOSIS — E034 Atrophy of thyroid (acquired): Secondary | ICD-10-CM | POA: Diagnosis not present

## 2022-02-24 DIAGNOSIS — E785 Hyperlipidemia, unspecified: Secondary | ICD-10-CM | POA: Diagnosis not present

## 2022-02-24 DIAGNOSIS — Z9181 History of falling: Secondary | ICD-10-CM | POA: Diagnosis not present

## 2022-02-24 DIAGNOSIS — E1169 Type 2 diabetes mellitus with other specified complication: Secondary | ICD-10-CM | POA: Diagnosis not present

## 2022-02-24 DIAGNOSIS — E782 Mixed hyperlipidemia: Secondary | ICD-10-CM | POA: Diagnosis not present

## 2022-02-24 DIAGNOSIS — J449 Chronic obstructive pulmonary disease, unspecified: Secondary | ICD-10-CM | POA: Diagnosis not present

## 2022-02-24 DIAGNOSIS — Z6829 Body mass index (BMI) 29.0-29.9, adult: Secondary | ICD-10-CM | POA: Diagnosis not present

## 2022-02-24 DIAGNOSIS — Z1331 Encounter for screening for depression: Secondary | ICD-10-CM | POA: Diagnosis not present

## 2022-02-26 DIAGNOSIS — R519 Headache, unspecified: Secondary | ICD-10-CM | POA: Diagnosis not present

## 2022-03-05 DIAGNOSIS — M4602 Spinal enthesopathy, cervical region: Secondary | ICD-10-CM | POA: Diagnosis not present

## 2022-03-05 DIAGNOSIS — Z981 Arthrodesis status: Secondary | ICD-10-CM | POA: Diagnosis not present

## 2022-03-05 DIAGNOSIS — M47812 Spondylosis without myelopathy or radiculopathy, cervical region: Secondary | ICD-10-CM | POA: Diagnosis not present

## 2022-03-05 DIAGNOSIS — M2578 Osteophyte, vertebrae: Secondary | ICD-10-CM | POA: Diagnosis not present

## 2022-03-08 DIAGNOSIS — H25813 Combined forms of age-related cataract, bilateral: Secondary | ICD-10-CM | POA: Diagnosis not present

## 2022-03-08 DIAGNOSIS — Z7984 Long term (current) use of oral hypoglycemic drugs: Secondary | ICD-10-CM | POA: Diagnosis not present

## 2022-03-08 DIAGNOSIS — H52223 Regular astigmatism, bilateral: Secondary | ICD-10-CM | POA: Diagnosis not present

## 2022-03-08 DIAGNOSIS — H524 Presbyopia: Secondary | ICD-10-CM | POA: Diagnosis not present

## 2022-03-08 DIAGNOSIS — E119 Type 2 diabetes mellitus without complications: Secondary | ICD-10-CM | POA: Diagnosis not present

## 2022-03-08 DIAGNOSIS — H5203 Hypermetropia, bilateral: Secondary | ICD-10-CM | POA: Diagnosis not present

## 2022-03-08 DIAGNOSIS — Z794 Long term (current) use of insulin: Secondary | ICD-10-CM | POA: Diagnosis not present

## 2022-03-15 ENCOUNTER — Ambulatory Visit: Payer: PPO | Admitting: Podiatry

## 2022-03-15 ENCOUNTER — Encounter: Payer: Self-pay | Admitting: Podiatry

## 2022-03-15 VITALS — BP 128/68

## 2022-03-15 DIAGNOSIS — M79675 Pain in left toe(s): Secondary | ICD-10-CM | POA: Diagnosis not present

## 2022-03-15 DIAGNOSIS — E119 Type 2 diabetes mellitus without complications: Secondary | ICD-10-CM

## 2022-03-15 DIAGNOSIS — B351 Tinea unguium: Secondary | ICD-10-CM

## 2022-03-15 DIAGNOSIS — M79674 Pain in right toe(s): Secondary | ICD-10-CM

## 2022-03-15 DIAGNOSIS — B359 Dermatophytosis, unspecified: Secondary | ICD-10-CM

## 2022-03-15 NOTE — Progress Notes (Signed)
  Subjective:  Patient ID: Scott Koch, male    DOB: 08-Dec-1955,  MRN: [id number]  No chief complaint on file.   67 y.o. male presents with the above complaint. History confirmed with patient. Patient presenting with pain related to dystrophic thickened elongated nails. Patient is unable to trim own nails related to nail dystrophy and/or mobility issues. Patient does have a history of T2DM.   Objective:  Physical Exam: warm, good capillary refill nail exam onychomycosis of the toenails, onycholysis, and dystrophic nails DP pulses palpable, PT pulses palpable, and protective sensation intact Left Foot:  Pain with palpation of nails due to elongation and dystrophic growth.  Right Foot: Pain with palpation of nails due to elongation and dystrophic growth.   Assessment:   1. Pain due to onychomycosis of toenails of both feet   2. Diabetes mellitus without complication ([place])   3. Tinea       Plan:  Patient was evaluated and treated and all questions answered.   #Onychomycosis with pain  -Nails palliatively debrided as below. -Educated on self-care  Procedure: Nail Debridement Rationale: Pain Type of Debridement: manual, sharp debridement. Instrumentation: Nail nipper, rotary burr. Number of Nails: 10  Return in about 3 months (around 06/13/2022) for [facility].         Everitt Amber, DPM [facility]

## 2022-04-07 ENCOUNTER — Other Ambulatory Visit: Payer: Self-pay | Admitting: Cardiology

## 2022-05-21 DIAGNOSIS — I10 Essential (primary) hypertension: Secondary | ICD-10-CM | POA: Diagnosis not present

## 2022-05-21 DIAGNOSIS — D692 Other nonthrombocytopenic purpura: Secondary | ICD-10-CM | POA: Diagnosis not present

## 2022-05-21 DIAGNOSIS — I509 Heart failure, unspecified: Secondary | ICD-10-CM | POA: Diagnosis not present

## 2022-05-21 DIAGNOSIS — Z794 Long term (current) use of insulin: Secondary | ICD-10-CM | POA: Diagnosis not present

## 2022-05-21 DIAGNOSIS — Z6827 Body mass index (BMI) 27.0-27.9, adult: Secondary | ICD-10-CM | POA: Diagnosis not present

## 2022-05-21 DIAGNOSIS — F17200 Nicotine dependence, unspecified, uncomplicated: Secondary | ICD-10-CM | POA: Diagnosis not present

## 2022-05-21 DIAGNOSIS — E1159 Type 2 diabetes mellitus with other circulatory complications: Secondary | ICD-10-CM | POA: Diagnosis not present

## 2022-05-21 DIAGNOSIS — Z7985 Long-term (current) use of injectable non-insulin antidiabetic drugs: Secondary | ICD-10-CM | POA: Diagnosis not present

## 2022-05-21 DIAGNOSIS — J449 Chronic obstructive pulmonary disease, unspecified: Secondary | ICD-10-CM | POA: Diagnosis not present

## 2022-05-21 DIAGNOSIS — E261 Secondary hyperaldosteronism: Secondary | ICD-10-CM | POA: Diagnosis not present

## 2022-05-25 DIAGNOSIS — J449 Chronic obstructive pulmonary disease, unspecified: Secondary | ICD-10-CM | POA: Diagnosis not present

## 2022-05-25 DIAGNOSIS — E782 Mixed hyperlipidemia: Secondary | ICD-10-CM | POA: Diagnosis not present

## 2022-06-09 DIAGNOSIS — I50810 Right heart failure, unspecified: Secondary | ICD-10-CM | POA: Diagnosis not present

## 2022-06-09 DIAGNOSIS — E785 Hyperlipidemia, unspecified: Secondary | ICD-10-CM | POA: Diagnosis not present

## 2022-06-09 DIAGNOSIS — E559 Vitamin D deficiency, unspecified: Secondary | ICD-10-CM | POA: Diagnosis not present

## 2022-06-09 DIAGNOSIS — E1169 Type 2 diabetes mellitus with other specified complication: Secondary | ICD-10-CM | POA: Diagnosis not present

## 2022-06-09 DIAGNOSIS — Z6828 Body mass index (BMI) 28.0-28.9, adult: Secondary | ICD-10-CM | POA: Diagnosis not present

## 2022-06-09 DIAGNOSIS — R6882 Decreased libido: Secondary | ICD-10-CM | POA: Diagnosis not present

## 2022-06-09 DIAGNOSIS — E782 Mixed hyperlipidemia: Secondary | ICD-10-CM | POA: Diagnosis not present

## 2022-06-09 DIAGNOSIS — E034 Atrophy of thyroid (acquired): Secondary | ICD-10-CM | POA: Diagnosis not present

## 2022-06-14 ENCOUNTER — Ambulatory Visit: Payer: PPO | Admitting: Podiatry

## 2022-07-01 ENCOUNTER — Ambulatory Visit: Payer: PPO | Admitting: Podiatry

## 2022-07-07 ENCOUNTER — Ambulatory Visit: Payer: PPO | Admitting: Podiatry

## 2022-07-07 DIAGNOSIS — M79674 Pain in right toe(s): Secondary | ICD-10-CM | POA: Diagnosis not present

## 2022-07-07 DIAGNOSIS — M79675 Pain in left toe(s): Secondary | ICD-10-CM

## 2022-07-07 DIAGNOSIS — B351 Tinea unguium: Secondary | ICD-10-CM

## 2022-07-10 NOTE — Progress Notes (Signed)
       Subjective:  Patient ID: Scott Koch, male    DOB: 1956/01/15,  MRN: [id number]   Het Helferich presents to clinic today for:  Chief Complaint  Patient presents with   Nail Problem    Rm 3 RFC bilateral nail trim. Pt complains of soreness of his left medial hallux and a white patch on his left 3rd toe.  . Patient notes nails are thick, discolored, elongated and painful in shoegear when trying to ambulate.  States his diabetes is well-controlled.  Denies numbness or burning in the feet  PCP is [name], [name], NP.  Patient states last visit was within 6 months  Allergies  Allergen Reactions   Other Anaphylaxis    Hazelnut NOELLE (sinus medication)   Penicillins Anaphylaxis   Sulfa Antibiotics Hives   Neosporin [Neomycin-Bacitracin Zn-Polymyx] Rash    REDNESS AROUND AREA APPLIED    Review of Systems: Negative except as noted in the HPI.  Objective:  There were no vitals filed for this visit.  Scott Koch is a pleasant 67 y.o. male in NAD. AAO x 3.  Vascular Examination: Capillary refill time is 3-5 seconds to toes bilateral. Palpable pedal pulses b/l LE. Digital hair present b/l. No pedal edema b/l. Skin temperature gradient WNL b/l. No varicosities b/l. No cyanosis or clubbing noted b/l.   Dermatological Examination: Pedal skin with normal turgor, texture and tone b/l. No open wounds. No interdigital macerations b/l. Toenails x10 are 3mm thick, discolored, dystrophic with subungual debris. There is pain with compression of the nail plates.  They are elongated x10.  The left medial hallux nail is slightly incurvated distally.  No clinical signs of infection are noted.  There is no erythema or edema.  Mild pain on palpation.  Neurological Examination: Protective sensation intact bilateral LE. Vibratory sensation intact bilateral LE.  Musculoskeletal Examination: Muscle strength 5/5 to all LE muscle groups b/l.   Assessment/Plan: 1. Pain due to onychomycosis of  toenails of both feet     The mycotic toenails were sharply debrided x10 with sterile nail nippers and a power debriding burr to decrease bulk/thickness and length.  The left medial hallux nail border was cut back distally.  Immediate relief noted.  No further treatment needed  Return in about 3 months (around 10/07/2022) for RFC.   Clerance Lav, DPM, FACFAS [facility]     [address] [place], [place] [address]                Office [phone]  Fax [phone]

## 2022-07-21 NOTE — Progress Notes (Unsigned)
Cardiology Office Note:    Date:  07/22/2022   ID:  Scott Koch, DOB [birth year], MRN [id number]  PCP:  Jim Like, NP  Cardiologist:  Norman Herrlich, MD    Referring MD: Jim Like, NP    ASSESSMENT:    1. Coronary artery disease of native artery of native heart with stable angina pectoris (HCC)   2. Hypertensive heart disease with heart failure (HCC)   3. Mixed hyperlipidemia   4. Diabetes mellitus without complication (HCC)   5. Chronic obstructive pulmonary disease, unspecified COPD type (HCC)    PLAN:    In order of problems listed above:  Although he is not having exertional angina he is having much more frequent episodes of emotionally induced changes anginal pattern warrants further evaluation with known CAD and remote PCI he is well-suited for and will do a perfusion myocardial stress test.  If significant ischemia or high risk markers would benefit from repeat coronary angiography and consideration of further revascularization.  Continue aspirin lipid-lowering with high intensity statin and nitroglycerin if needed Stable no shortness of breath or edema he will continue his current loop diuretic ARB and will also reassess ejection fraction on perfusion imaging LDL at target continue his current statin Controlled diabetes Stable on his current bronchodilator   Next appointment: 6 months   Medication Adjustments/Labs and Tests Ordered: Current medicines are reviewed at length with the patient today.  Concerns regarding medicines are outlined above.  Orders Placed This Encounter  Procedures   MYOCARDIAL PERFUSION IMAGING   EKG 12-Lead   No orders of the defined types were placed in this encounter.    History of Present Illness:    Scott Koch is a 67 y.o. male with a hx of CAD with remote PCI and stent more than 20 years ago chronic diastolic heart failure type 2 diabetes with nephropathy COPD and hyperlipidemia last seen 10/20/2021.  Compliance with diet,  lifestyle and medications: Yes  He is not doing as well.  He has a great deal of family stress with his children and with emotional distress he gets typical angina substernal chest pressure and needed nitroglycerin on 1 occasion and the episodes are occurring more frequently.  Both [name] and his wife are not sure if is just stress or progressive heart disease He was asked every day and has no exertional angina 8 tolerates his statin without muscle pain or weakness his diabetes is well-controlled with injectable Trulicity. He is not having edema shortness of breath orthopnea palpitation or syncope Recent labs 06/09/2022 A1c 6.7% hemoglobin 15.0 creatinine 0.92 potassium 4.9 LDL 68 cholesterol 125, in summary all of his labs are good Past Medical History:  Diagnosis Date   Asthma    inhaler used only when seasonal allergies    Complication of anesthesia    patient states"gets rowdy" when wake up   COPD (chronic obstructive pulmonary disease) (HCC)    Diabetes mellitus without complication (HCC)    fasting blood sugar avg 120   Heart murmur    Hypertension    Hypothyroidism    Pneumonia    hx   Protein in urine    Shortness of breath dyspnea    hx    Past Surgical History:  Procedure Laterality Date   ANTERIOR CERVICAL DECOMP/DISCECTOMY FUSION  01/13/2012   Procedure: ANTERIOR CERVICAL DECOMPRESSION/DISCECTOMY FUSION 3 LEVELS;  Surgeon: Mariam Dollar, MD;  Location: [facility];  Service: Neurosurgery;  Laterality: N/A;  Cervical three-four, cervical four five,  cervical six-seven anterior cervical decompression fusion with plate    CORONARY ANGIOPLASTY  2000   stent   SHOULDER ARTHROSCOPY Right 14    Current Medications: Current Meds  Medication Sig   albuterol (PROVENTIL HFA;VENTOLIN HFA) 108 (90 BASE) MCG/ACT inhaler Inhale 2 puffs into the lungs every 6 (six) hours as needed for wheezing or shortness of breath. For shortness of breath   ANORO ELLIPTA 62.5-25 MCG/ACT AEPB  Inhale 1 puff into the lungs daily.   aspirin EC 81 MG tablet Take 81 mg by mouth daily.   Dulaglutide 3 MG/0.5ML SOPN Inject 3 mg into the skin once a week.   JANUMET XR 50-1000 MG TB24 Take 2 tablets by mouth daily.   LANTUS SOLOSTAR 100 UNIT/ML Solostar Pen Inject 25 Units into the skin daily.   levothyroxine (SYNTHROID) 100 MCG tablet Take 100 mcg by mouth daily before breakfast.   loratadine (CLARITIN) 10 MG tablet Take 10 mg by mouth daily.   losartan (COZAAR) 25 MG tablet Take 25 mg by mouth daily.   montelukast (SINGULAIR) 10 MG tablet Take 10 mg by mouth at bedtime.   nitroGLYCERIN (NITROSTAT) 0.4 MG SL tablet Place 0.4 mg under the tongue every 5 (five) minutes as needed for chest pain.   omeprazole (PRILOSEC) 20 MG capsule Take 20 mg by mouth daily.   ONETOUCH VERIO test strip USE 1 STRIP EVERY DAY (Patient taking differently: 1 each by Other route as needed for other (GLucose).)   rosuvastatin (CRESTOR) 10 MG tablet Take 10 mg by mouth daily.   tamsulosin (FLOMAX) 0.4 MG CAPS capsule Take 0.4 mg by mouth once a week.   torsemide (DEMADEX) 20 MG tablet Take 1 tablet (20 mg total) by mouth daily.   UNIFINE PENTIPS 32G X 4 MM MISC 1 each by Other route daily.   Vitamin D, Ergocalciferol, (DRISDOL) 1.25 MG (50000 UNIT) CAPS capsule Take 50,000 Units by mouth every 7 (seven) days.   [DISCONTINUED] HYDROcodone-acetaminophen (NORCO/VICODIN) 5-325 MG tablet Take 1 tablet by mouth every 6 (six) hours as needed for moderate pain or severe pain.     Allergies:   Other, Penicillins, Sulfa antibiotics, and Neosporin [neomycin-bacitracin zn-polymyx]   EKGs/Labs/Other Studies Reviewed:    The following studies were reviewed today:  Cardiac Studies & Procedures     STRESS TESTS  MYOCARDIAL PERFUSION IMAGING 01/16/2021  Narrative   Findings are consistent with no  ischemia and no prior myocardial infarction. The study is low risk.   No ST deviation was noted.   Left ventricular function  is normal. Nuclear stress EF: 71 %. The left ventricular ejection fraction is hyperdynamic (>65%). End diastolic cavity size is normal.   Prior study available for comparison from 11/29/2001.   Diaphragmatic attenuation noted.   ECHOCARDIOGRAM  ECHOCARDIOGRAM COMPLETE 12/11/2020  Narrative ECHOCARDIOGRAM REPORT    Patient Name:   Scott Koch Date of Exam: 12/11/2020 Medical Rec #:  [id number]    Height:       72.0 in Accession #:    [id number]   Weight:       246.0 lb Date of Birth:  09-04-1955    BSA:          2.327 m Patient Age:    65 years     BP:           104/76 mmHg Patient Gender: M            HR:  60 bpm. Exam Location:  [facility]  Procedure: 2D Echo, Cardiac Doppler and Color Doppler  Indications:    Hypertensive heart disease with heart failure (HCC) [I11.0 (ICD-10-CM)]; Chronic obstructive pulmonary disease, unspecified COPD type (HCC) [J44.9 (ICD-10-CM)]; Diabetes mellitus without complication (HCC) [E11.9 (ICD-10-CM)]; Coronary artery disease of native artery of native heart with stable angina pectoris (HCC) [I25.118 (ICD-10-CM)]  History:        Patient has no prior history of Echocardiogram examinations. Signs/Symptoms:Murmur; Risk Factors:Hypertension.  Sonographer:    Louie Boston RDCS Referring Phys: [id number] Colburn Asper J Sina Sumpter  IMPRESSIONS   1. Left ventricular ejection fraction, by estimation, is 50 to 55%. The left ventricle has low normal function. The left ventricle has no regional wall motion abnormalities. There is mild left ventricular hypertrophy. Left ventricular diastolic parameters are consistent with Grade I diastolic dysfunction (impaired relaxation). 2. Right ventricular systolic function is normal. The right ventricular size is normal. There is normal pulmonary artery systolic pressure. 3. The mitral valve is normal in structure. No evidence of mitral valve regurgitation. No evidence of mitral stenosis. 4. The aortic valve is normal in  structure. Aortic valve regurgitation is not visualized. Aortic valve sclerosis is present, with no evidence of aortic valve stenosis. 5. The inferior vena cava is normal in size with greater than 50% respiratory variability, suggesting right atrial pressure of 3 mmHg.  FINDINGS Left Ventricle: Left ventricular ejection fraction, by estimation, is 50 to 55%. The left ventricle has low normal function. The left ventricle has no regional wall motion abnormalities. The left ventricular internal cavity size was normal in size. There is mild left ventricular hypertrophy. Left ventricular diastolic parameters are consistent with Grade I diastolic dysfunction (impaired relaxation). Normal left ventricular filling pressure.  Right Ventricle: The right ventricular size is normal. No increase in right ventricular wall thickness. Right ventricular systolic function is normal. There is normal pulmonary artery systolic pressure. The tricuspid regurgitant velocity is 1.93 m/s, and with an assumed right atrial pressure of 3 mmHg, the estimated right ventricular systolic pressure is 17.9 mmHg.  Left Atrium: Left atrial size was normal in size.  Right Atrium: Right atrial size was normal in size.  Pericardium: There is no evidence of pericardial effusion.  Mitral Valve: The mitral valve is normal in structure. No evidence of mitral valve regurgitation. No evidence of mitral valve stenosis.  Tricuspid Valve: The tricuspid valve is normal in structure. Tricuspid valve regurgitation is trivial. No evidence of tricuspid stenosis.  Aortic Valve: The aortic valve is normal in structure. Aortic valve regurgitation is not visualized. Aortic valve sclerosis is present, with no evidence of aortic valve stenosis.  Pulmonic Valve: The pulmonic valve was normal in structure. Pulmonic valve regurgitation is not visualized. No evidence of pulmonic stenosis.  Aorta: The aortic root and ascending aorta are structurally  normal, with no evidence of dilitation and the aortic arch was not well visualized.  Venous: The pulmonary veins were not well visualized. The inferior vena cava is normal in size with greater than 50% respiratory variability, suggesting right atrial pressure of 3 mmHg.  IAS/Shunts: No atrial level shunt detected by color flow Doppler.   LEFT VENTRICLE PLAX 2D LVIDd:         3.80 cm     Diastology LVIDs:         2.80 cm     LV e' medial:    7.07 cm/s LV PW:         1.10 cm  LV E/e' medial:  9.0 LV IVS:        1.10 cm     LV e' lateral:   7.07 cm/s LVOT diam:     2.10 cm     LV E/e' lateral: 9.0 LV SV:         53 LV SV Index:   23 LVOT Area:     3.46 cm  LV Volumes (MOD) LV vol d, MOD A2C: 43.5 ml LV vol d, MOD A4C: 44.6 ml LV vol s, MOD A2C: 22.1 ml LV vol s, MOD A4C: 24.5 ml LV SV MOD A2C:     21.4 ml LV SV MOD A4C:     44.6 ml LV SV MOD BP:      21.5 ml  RIGHT VENTRICLE             IVC RV S prime:     14.50 cm/s  IVC diam: 1.60 cm TAPSE (M-mode): 2.6 cm  LEFT ATRIUM           Index       RIGHT ATRIUM           Index LA diam:      2.90 cm 1.25 cm/m  RA Area:     11.70 cm LA Vol (A2C): 15.0 ml 6.45 ml/m  RA Volume:   27.70 ml  11.91 ml/m LA Vol (A4C): 22.5 ml 9.67 ml/m AORTIC VALVE LVOT Vmax:   93.30 cm/s LVOT Vmean:  56.500 cm/s LVOT VTI:    0.152 m  AORTA Ao Root diam: 3.50 cm Ao Asc diam:  3.20 cm Ao Desc diam: 2.00 cm  MITRAL VALVE               TRICUSPID VALVE MV Area (PHT): 5.06 cm    TR Peak grad:   14.9 mmHg MV Decel Time: 150 msec    TR Vmax:        193.00 cm/s MV E velocity: 63.40 cm/s MV A velocity: 93.80 cm/s  SHUNTS MV E/A ratio:  0.68        Systemic VTI:  0.15 m Systemic Diam: 2.10 cm  Norman Herrlich MD Electronically signed by Norman Herrlich MD Signature Date/Time: 12/11/2020/5:28:43 PM    Final              His EKG today shows sinus rhythm he has 1 PVC otherwise normal.  Recent Labs: No results found for requested labs within  last 365 days.  Recent Lipid Panel No results found for: "CHOL", "TRIG", "HDL", "CHOLHDL", "VLDL", "LDLCALC", "LDLDIRECT"  Physical Exam:    VS:  BP 110/64 (BP Location: Left Arm, Patient Position: Sitting)   Pulse 68   Ht 6' (1.829 m)   Wt 213 lb 6.4 oz (96.8 kg)   SpO2 96%   BMI 28.94 kg/m     Wt Readings from Last 3 Encounters:  07/22/22 213 lb 6.4 oz (96.8 kg)  10/20/21 243 lb 9.6 oz (110.5 kg)  04/13/21 254 lb (115.2 kg)     GEN:  Well nourished, well developed in no acute distress HEENT: Normal NECK: No JVD; No carotid bruits LYMPHATICS: No lymphadenopathy CARDIAC: RRR, no murmurs, rubs, gallops RESPIRATORY:  Clear to auscultation without rales, wheezing or rhonchi  ABDOMEN: Soft, non-tender, non-distended MUSCULOSKELETAL:  No edema; No deformity  SKIN: Warm and dry NEUROLOGIC:  Alert and oriented x 3 PSYCHIATRIC:  Normal affect    Signed, Norman Herrlich, MD  07/22/2022 11:45 AM    [facility]

## 2022-07-22 ENCOUNTER — Encounter: Payer: Self-pay | Admitting: Cardiology

## 2022-07-22 ENCOUNTER — Ambulatory Visit: Payer: PPO | Attending: Cardiology | Admitting: Cardiology

## 2022-07-22 ENCOUNTER — Telehealth (HOSPITAL_COMMUNITY): Payer: Self-pay | Admitting: *Deleted

## 2022-07-22 VITALS — BP 110/64 | HR 68 | Ht 72.0 in | Wt 213.4 lb

## 2022-07-22 DIAGNOSIS — E119 Type 2 diabetes mellitus without complications: Secondary | ICD-10-CM | POA: Diagnosis not present

## 2022-07-22 DIAGNOSIS — E782 Mixed hyperlipidemia: Secondary | ICD-10-CM | POA: Diagnosis not present

## 2022-07-22 DIAGNOSIS — Z7984 Long term (current) use of oral hypoglycemic drugs: Secondary | ICD-10-CM

## 2022-07-22 DIAGNOSIS — I110 Hypertensive heart disease with heart failure: Secondary | ICD-10-CM

## 2022-07-22 DIAGNOSIS — J449 Chronic obstructive pulmonary disease, unspecified: Secondary | ICD-10-CM | POA: Diagnosis not present

## 2022-07-22 DIAGNOSIS — I25118 Atherosclerotic heart disease of native coronary artery with other forms of angina pectoris: Secondary | ICD-10-CM | POA: Diagnosis not present

## 2022-07-22 NOTE — Patient Instructions (Signed)
Medication Instructions:  Your physician recommends that you continue on your current medications as directed. Please refer to the Current Medication list given to you today.  *If you need a refill on your cardiac medications before your next appointment, please call your pharmacy*   Lab Work: None If you have labs (blood work) drawn today and your tests are completely normal, you will receive your results only by: MyChart Message (if you have MyChart) OR A paper copy in the mail If you have any lab test that is abnormal or we need to change your treatment, we will call you to review the results.   Testing/Procedures:   [facility] [address] [place], [place] [address] Phone:  [phone]    Please arrive 15 minutes prior to your appointment time for registration and insurance purposes.  The test will take approximately 3 to 4 hours to complete; you may bring reading material.  If someone comes with you to your appointment, they will need to remain in the main lobby due to limited space in the testing area. **If you are pregnant or breastfeeding, please notify the nuclear lab prior to your appointment**  How to prepare for your Myocardial Perfusion Test: Do not eat or drink 3 hours prior to your test, except you may have water. Do not consume products containing caffeine (regular or decaffeinated) 12 hours prior to your test. (ex: coffee, chocolate, sodas, tea). Do bring a list of your current medications with you.  If not listed below, you may take your medications as normal. HOLD diabetic medication/insulin the morning of the test: Janumet, Take half of long acting insulin the night before test: Nuclear Stress Team will call with instructions. Do wear comfortable clothes (no dresses or overalls) and walking shoes, tennis shoes preferred (No heels or open toe shoes are allowed). Do NOT wear cologne, perfume, aftershave, or lotions (deodorant is  allowed). If these instructions are not followed, your test will have to be rescheduled.  Please report to [address] for your test.  If you have questions or concerns about your appointment, you can call the [facility] at [phone].  If you cannot keep your appointment, please provide 24 hours notification to the Nuclear Lab, to avoid a possible $50 charge to your account.    Follow-Up: At [facility], you and your health needs are our priority.  As part of our continuing mission to provide you with exceptional heart care, we have created designated Provider Care Teams.  These Care Teams include your primary Cardiologist (physician) and Advanced Practice Providers (APPs -  Physician Assistants and Nurse Practitioners) who all work together to provide you with the care you need, when you need it.  We recommend signing up for the patient portal called "MyChart".  Sign up information is provided on this After Visit Summary.  MyChart is used to connect with patients for Virtual Visits (Telemedicine).  Patients are able to view lab/test results, encounter notes, upcoming appointments, etc.  Non-urgent messages can be sent to your provider as well.   To learn more about what you can do with MyChart, go to [email or web address].    Your next appointment:   6 month(s)  Provider:   Norman Herrlich, MD    Other Instructions None

## 2022-07-22 NOTE — Telephone Encounter (Signed)
Pt reached and given instructions for MPI study. 

## 2022-07-22 NOTE — Addendum Note (Signed)
Addended by: Roxanne Mins I on: 07/22/2022 02:07 PM   Modules accepted: Orders

## 2022-07-28 ENCOUNTER — Ambulatory Visit: Payer: PPO | Attending: Cardiology

## 2022-07-28 DIAGNOSIS — I110 Hypertensive heart disease with heart failure: Secondary | ICD-10-CM

## 2022-07-28 DIAGNOSIS — I25118 Atherosclerotic heart disease of native coronary artery with other forms of angina pectoris: Secondary | ICD-10-CM | POA: Diagnosis not present

## 2022-07-28 DIAGNOSIS — J449 Chronic obstructive pulmonary disease, unspecified: Secondary | ICD-10-CM

## 2022-07-28 DIAGNOSIS — E782 Mixed hyperlipidemia: Secondary | ICD-10-CM | POA: Diagnosis not present

## 2022-07-28 DIAGNOSIS — E119 Type 2 diabetes mellitus without complications: Secondary | ICD-10-CM

## 2022-07-28 LAB — MYOCARDIAL PERFUSION IMAGING
Angina Index: 0
Duke Treadmill Score: 6
Estimated workload: 7
Exercise duration (min): 5 min
Exercise duration (sec): 45 s
LV dias vol: 91 mL (ref 62–150)
LV sys vol: 40 mL
MPHR: 152 {beats}/min
Nuc Stress EF: 56 %
Peak HR: 133 {beats}/min
Percent HR: 87 %
Rest HR: 73 {beats}/min
Rest Nuclear Isotope Dose: 10.7 mCi
SDS: 4
SRS: 2
SSS: 6
ST Depression (mm): 0 mm
Stress Nuclear Isotope Dose: 32.9 mCi
TID: 1.06

## 2022-07-28 MED ORDER — TECHNETIUM TC 99M TETROFOSMIN IV KIT
32.9000 | PACK | Freq: Once | INTRAVENOUS | Status: AC | PRN
  Administered 2022-07-28: 32.9 via INTRAVENOUS

## 2022-07-28 MED ORDER — TECHNETIUM TC 99M TETROFOSMIN IV KIT
10.7000 | PACK | Freq: Once | INTRAVENOUS | Status: AC | PRN
  Administered 2022-07-28: 10.7 via INTRAVENOUS

## 2022-07-29 NOTE — Addendum Note (Signed)
Addended byNorman Herrlich on: 07/29/2022 02:11 PM   Modules accepted: Orders

## 2022-07-30 ENCOUNTER — Telehealth: Payer: Self-pay

## 2022-07-30 MED ORDER — ISOSORBIDE MONONITRATE ER 30 MG PO TB24: 30.0000 mg | ORAL_TABLET | Freq: Every day | ORAL | 3 refills | Status: DC

## 2022-07-30 NOTE — Telephone Encounter (Signed)
-----   Message from Baldo Daub, MD sent at 07/29/2022  2:14 PM EDT ----- Normal perfusion test  Does he want to try imdur 30 mg day, if yes start  He has emotion induced angina

## 2022-09-14 DIAGNOSIS — Z125 Encounter for screening for malignant neoplasm of prostate: Secondary | ICD-10-CM | POA: Diagnosis not present

## 2022-09-14 DIAGNOSIS — K219 Gastro-esophageal reflux disease without esophagitis: Secondary | ICD-10-CM | POA: Diagnosis not present

## 2022-09-14 DIAGNOSIS — E034 Atrophy of thyroid (acquired): Secondary | ICD-10-CM | POA: Diagnosis not present

## 2022-09-14 DIAGNOSIS — E782 Mixed hyperlipidemia: Secondary | ICD-10-CM | POA: Diagnosis not present

## 2022-09-14 DIAGNOSIS — Z139 Encounter for screening, unspecified: Secondary | ICD-10-CM | POA: Diagnosis not present

## 2022-09-14 DIAGNOSIS — E1169 Type 2 diabetes mellitus with other specified complication: Secondary | ICD-10-CM | POA: Diagnosis not present

## 2022-09-14 DIAGNOSIS — Z6828 Body mass index (BMI) 28.0-28.9, adult: Secondary | ICD-10-CM | POA: Diagnosis not present

## 2022-09-14 DIAGNOSIS — I50810 Right heart failure, unspecified: Secondary | ICD-10-CM | POA: Diagnosis not present

## 2022-09-14 DIAGNOSIS — E559 Vitamin D deficiency, unspecified: Secondary | ICD-10-CM | POA: Diagnosis not present

## 2022-09-30 DIAGNOSIS — D72829 Elevated white blood cell count, unspecified: Secondary | ICD-10-CM | POA: Diagnosis not present

## 2022-10-07 ENCOUNTER — Ambulatory Visit: Payer: PPO | Admitting: Podiatry

## 2022-10-07 DIAGNOSIS — B353 Tinea pedis: Secondary | ICD-10-CM

## 2022-10-07 DIAGNOSIS — B351 Tinea unguium: Secondary | ICD-10-CM

## 2022-10-07 DIAGNOSIS — M79674 Pain in right toe(s): Secondary | ICD-10-CM

## 2022-10-07 DIAGNOSIS — M79675 Pain in left toe(s): Secondary | ICD-10-CM | POA: Diagnosis not present

## 2022-10-07 MED ORDER — KETOCONAZOLE 2 % EX CREA
1.0000 | TOPICAL_CREAM | Freq: Two times a day (BID) | CUTANEOUS | 2 refills | Status: AC
Start: 2022-10-07 — End: ?

## 2022-10-07 NOTE — Progress Notes (Signed)
Subjective:  Patient ID: Scott Koch, male    DOB: [birth year],  MRN: [id number]  Scott Koch presents to clinic today for:  Chief Complaint  Patient presents with   Nail Problem    Diabetic Foot Care-nail trim   PCP-Scott [name], NP Latest A1C: 6.7    Tinea Pedis    Possible athletes feet to right heel. Denies any itching. Dry skin and skin peeling.    Patient notes nails are thick, discolored, elongated and painful in shoegear when trying to ambulate.  Has peeling/rash on the heels.  Denies itching.   PCP is Koch, Scott Killian, NP.  Past Medical History:  Diagnosis Date   Asthma    inhaler used only when seasonal allergies    Complication of anesthesia    patient states"gets rowdy" when wake up   COPD (chronic obstructive pulmonary disease) (HCC)    Diabetes mellitus without complication (HCC)    fasting blood sugar avg 120   Heart murmur    Hypertension    Hypothyroidism    Pneumonia    hx   Protein in urine    Shortness of breath dyspnea    hx    Past Surgical History:  Procedure Laterality Date   ANTERIOR CERVICAL DECOMP/DISCECTOMY FUSION  01/13/2012   Procedure: ANTERIOR CERVICAL DECOMPRESSION/DISCECTOMY FUSION 3 LEVELS;  Surgeon: Scott Dollar, MD;  Location: [facility];  Service: Neurosurgery;  Laterality: N/A;  Cervical three-four, cervical four five, cervical six-seven anterior cervical decompression fusion with plate    CORONARY ANGIOPLASTY  2000   stent   SHOULDER ARTHROSCOPY Right 14    Allergies  Allergen Reactions   Other Anaphylaxis    Hazelnut NOELLE (sinus medication)   Penicillins Anaphylaxis   Sulfa Antibiotics Hives   Neosporin [Neomycin-Bacitracin Zn-Polymyx] Rash    REDNESS AROUND AREA APPLIED    Review of Systems: Negative except as noted in the HPI.  Objective:  There were no vitals filed for this visit.  Scott Koch is a pleasant 67 y.o. male in NAD. AAO x 3.  Vascular Examination: Capillary refill time is 3-5 seconds to toes  bilateral. Palpable pedal pulses b/l LE. Digital hair present b/l.  Skin temperature gradient WNL b/l. No varicosities b/l. No cyanosis noted b/l.   Dermatological Examination: Pedal skin with normal turgor, texture and tone b/l. No open wounds. No interdigital macerations b/l. Toenails x10 are 3mm thick, discolored, dystrophic with subungual debris. There is pain with compression of the nail plates.  They are elongated x10.  Rash/peeling/minimal erythema along periphery of both heels, right is more involved than left foot.  Assessment/Plan: 1. Pain due to onychomycosis of toenails of both feet   2. Tinea pedis of both feet     Meds ordered this encounter  Medications   ketoconazole (NIZORAL) 2 % cream    Sig: Apply 1 Application topically 2 (two) times daily. Apply 1gm to heel areas twice daily and rub in well    Dispense:  60 g    Refill:  2   The mycotic toenails were sharply debrided x10 with sterile nail nippers and a power debriding burr to decrease bulk/thickness and length.    Rx ketoconazole sent to his pharmacy to massage on heels BID x 4 weeks.   Return in about 3 months (around 01/06/2023) for [facility].   Scott Koch, DPM, FACFAS [facility]     [address]  [place], [place] [address]                Office [phone]  Fax [phone]

## 2022-10-11 IMAGING — CR DG CHEST 2V
2 series · 2 of 2 positions shown · non-contrast
Comparison: 09/17/2019

CLINICAL DATA: Chest pain

EXAM:
CHEST - 2 VIEW

[chest pa]
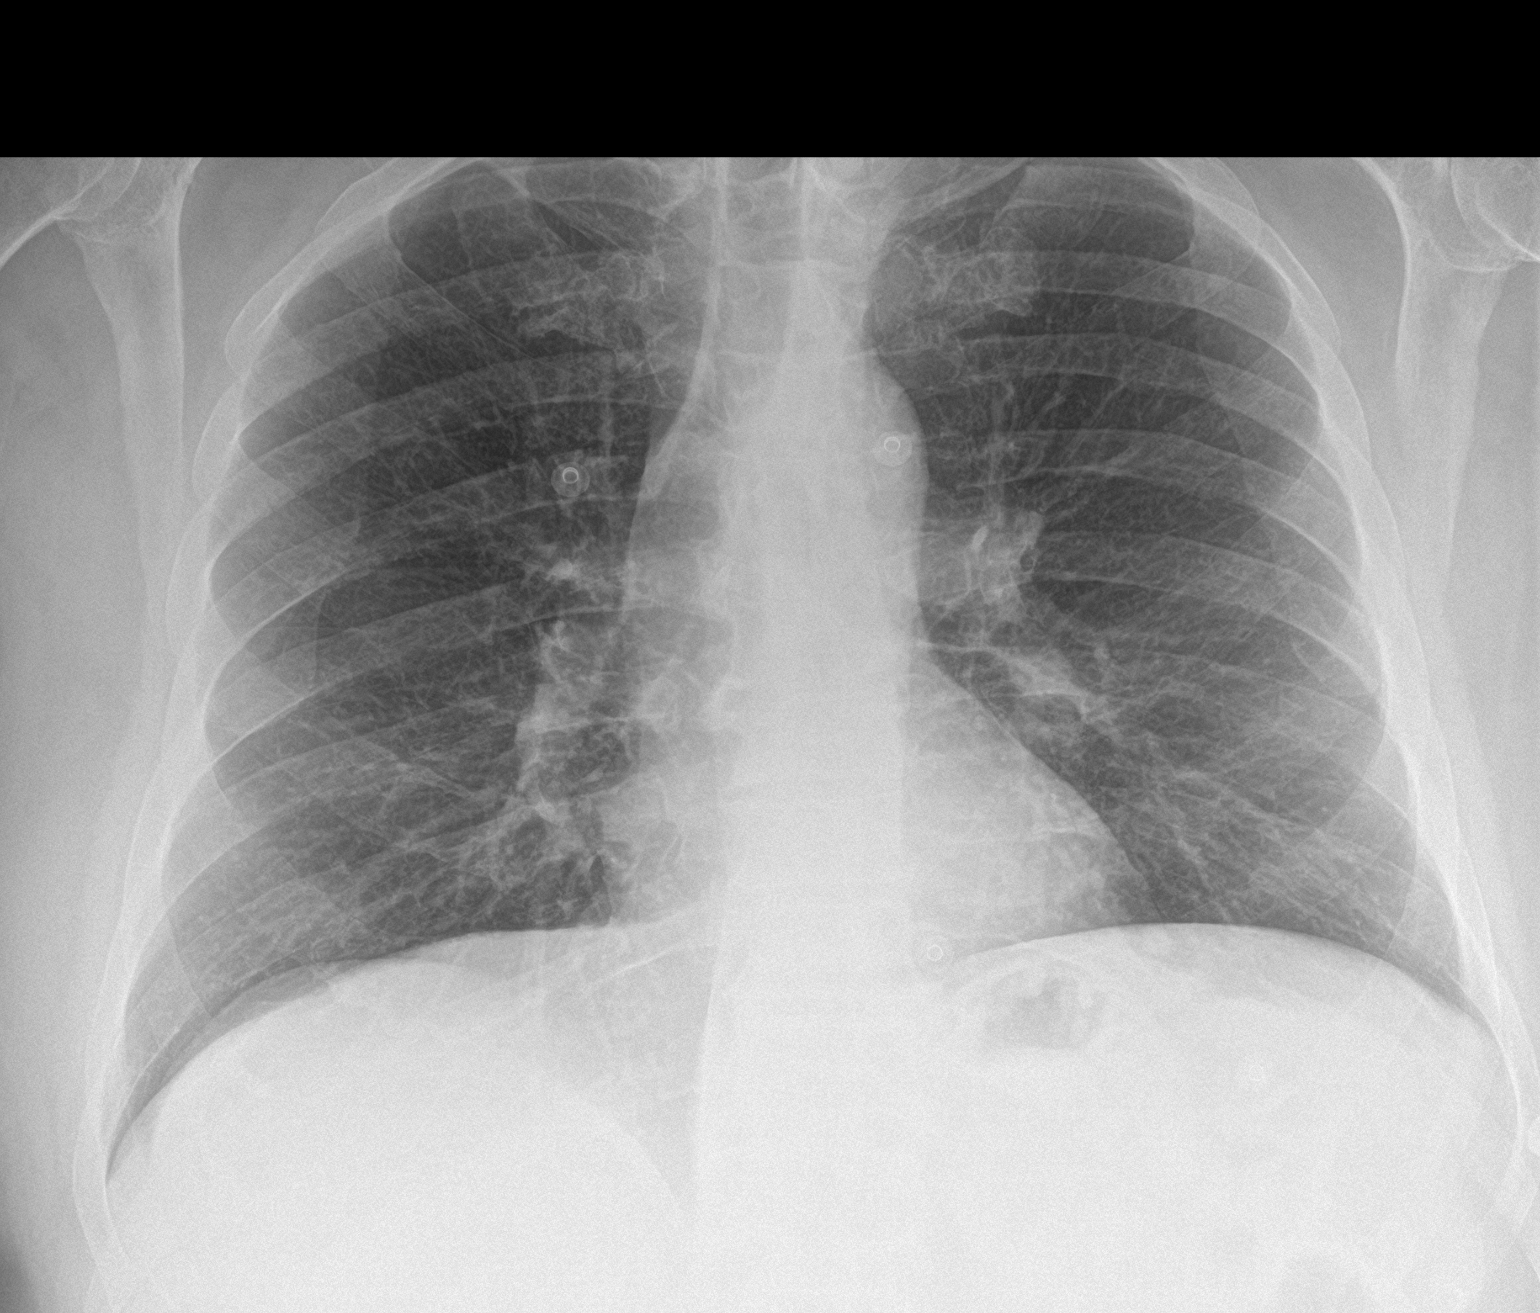

[chest lat]
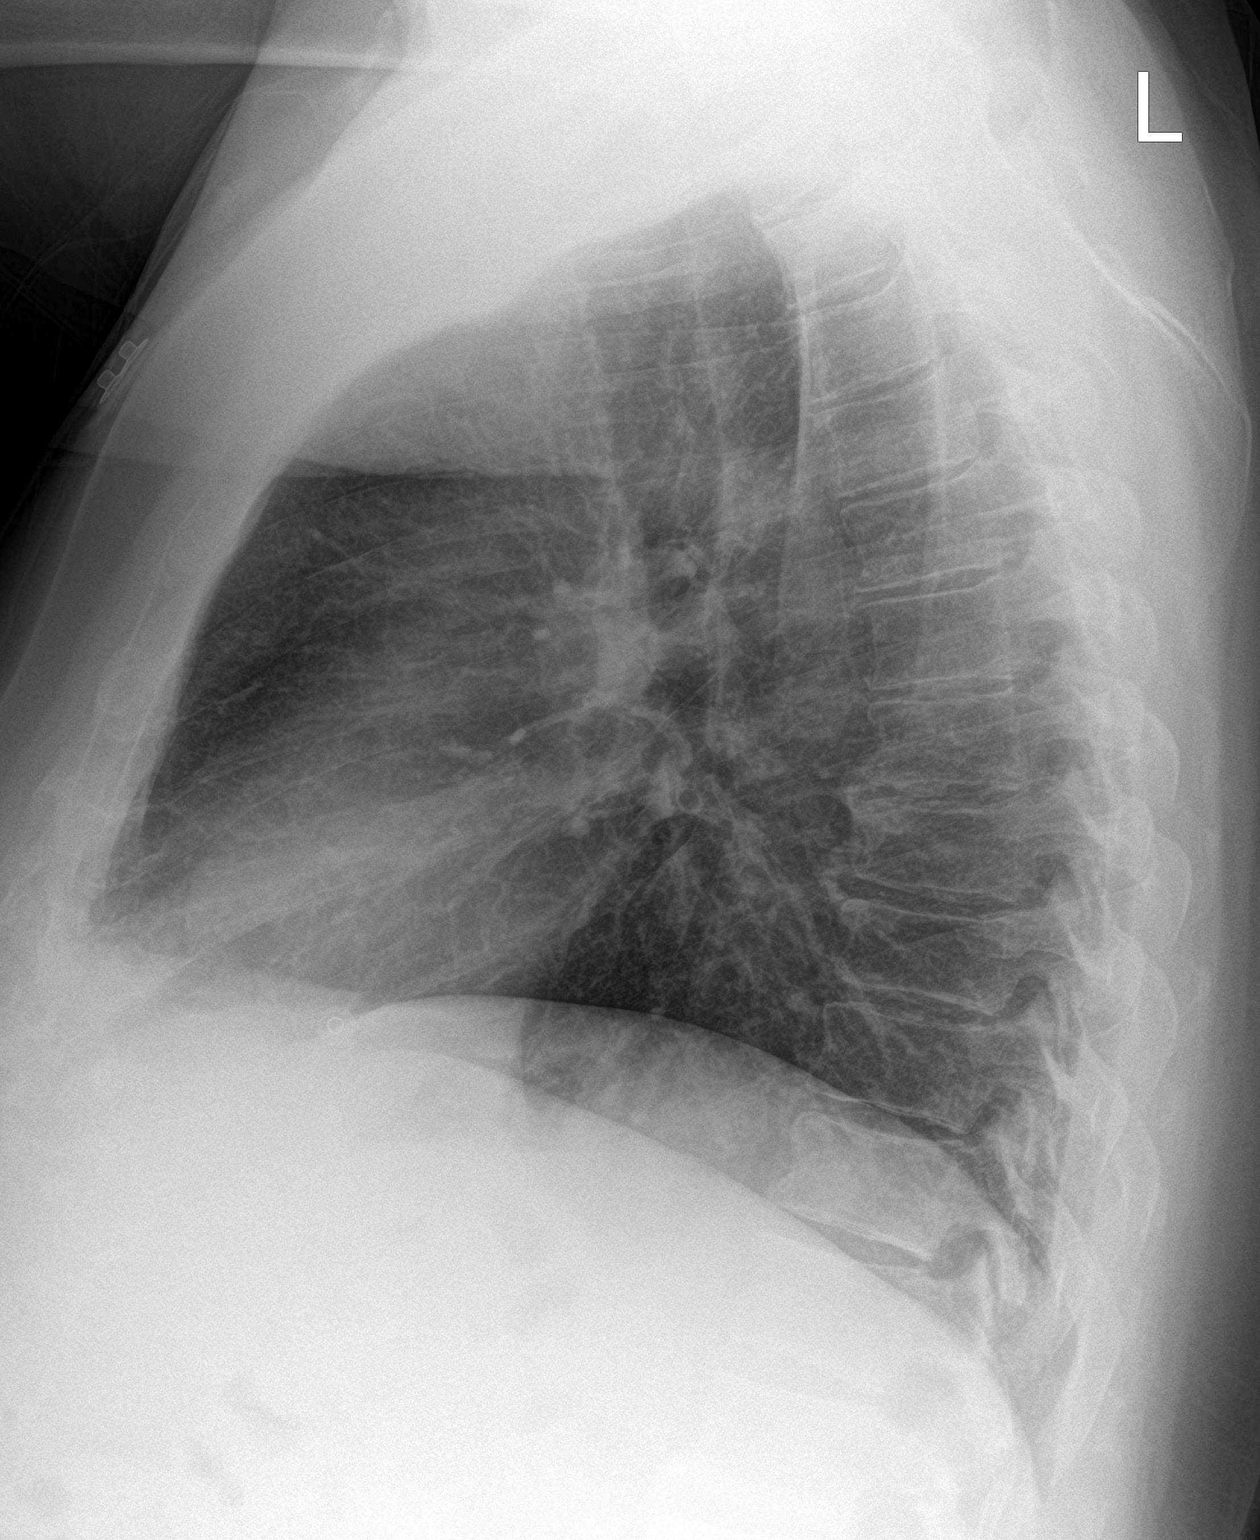

[2 of 2 positions shown; findings below may reference images not displayed]

FINDINGS: The heart size and mediastinal contours are within normal limits.
Both lungs are clear. Hardware in the cervical spine.
IMPRESSION: No active cardiopulmonary disease.

## 2022-10-22 ENCOUNTER — Inpatient Hospital Stay (HOSPITAL_COMMUNITY)
Admission: EM | Admit: 2022-10-22 | Discharge: 2022-10-26 | DRG: 324 | Disposition: A | Discharge status: Home / Self Care | Payer: PPO | Source: Other Acute Inpatient Hospital | Attending: Cardiology | Admitting: Cardiology

## 2022-10-22 ENCOUNTER — Encounter (HOSPITAL_COMMUNITY): Payer: Self-pay

## 2022-10-22 DIAGNOSIS — E1121 Type 2 diabetes mellitus with diabetic nephropathy: Secondary | ICD-10-CM | POA: Diagnosis not present

## 2022-10-22 DIAGNOSIS — I2510 Atherosclerotic heart disease of native coronary artery without angina pectoris: Secondary | ICD-10-CM | POA: Diagnosis present

## 2022-10-22 DIAGNOSIS — Z8249 Family history of ischemic heart disease and other diseases of the circulatory system: Secondary | ICD-10-CM | POA: Diagnosis not present

## 2022-10-22 DIAGNOSIS — Z716 Tobacco abuse counseling: Secondary | ICD-10-CM

## 2022-10-22 DIAGNOSIS — Z882 Allergy status to sulfonamides status: Secondary | ICD-10-CM | POA: Diagnosis not present

## 2022-10-22 DIAGNOSIS — Z8042 Family history of malignant neoplasm of prostate: Secondary | ICD-10-CM

## 2022-10-22 DIAGNOSIS — Z7984 Long term (current) use of oral hypoglycemic drugs: Secondary | ICD-10-CM | POA: Diagnosis not present

## 2022-10-22 DIAGNOSIS — Z955 Presence of coronary angioplasty implant and graft: Secondary | ICD-10-CM | POA: Diagnosis not present

## 2022-10-22 DIAGNOSIS — E785 Hyperlipidemia, unspecified: Secondary | ICD-10-CM | POA: Diagnosis not present

## 2022-10-22 DIAGNOSIS — E7800 Pure hypercholesterolemia, unspecified: Secondary | ICD-10-CM | POA: Diagnosis not present

## 2022-10-22 DIAGNOSIS — Z23 Encounter for immunization: Secondary | ICD-10-CM | POA: Diagnosis not present

## 2022-10-22 DIAGNOSIS — I10 Essential (primary) hypertension: Secondary | ICD-10-CM | POA: Diagnosis not present

## 2022-10-22 DIAGNOSIS — I110 Hypertensive heart disease with heart failure: Secondary | ICD-10-CM | POA: Diagnosis not present

## 2022-10-22 DIAGNOSIS — J4489 Other specified chronic obstructive pulmonary disease: Secondary | ICD-10-CM | POA: Diagnosis not present

## 2022-10-22 DIAGNOSIS — I219 Acute myocardial infarction, unspecified: Secondary | ICD-10-CM | POA: Diagnosis not present

## 2022-10-22 DIAGNOSIS — Z880 Allergy status to penicillin: Secondary | ICD-10-CM

## 2022-10-22 DIAGNOSIS — E119 Type 2 diabetes mellitus without complications: Secondary | ICD-10-CM | POA: Diagnosis not present

## 2022-10-22 DIAGNOSIS — J449 Chronic obstructive pulmonary disease, unspecified: Secondary | ICD-10-CM | POA: Diagnosis not present

## 2022-10-22 DIAGNOSIS — I5032 Chronic diastolic (congestive) heart failure: Secondary | ICD-10-CM | POA: Diagnosis present

## 2022-10-22 DIAGNOSIS — Z981 Arthrodesis status: Secondary | ICD-10-CM | POA: Diagnosis not present

## 2022-10-22 DIAGNOSIS — I959 Hypotension, unspecified: Secondary | ICD-10-CM | POA: Diagnosis not present

## 2022-10-22 DIAGNOSIS — N189 Chronic kidney disease, unspecified: Secondary | ICD-10-CM | POA: Diagnosis not present

## 2022-10-22 DIAGNOSIS — Z794 Long term (current) use of insulin: Secondary | ICD-10-CM | POA: Diagnosis not present

## 2022-10-22 DIAGNOSIS — E118 Type 2 diabetes mellitus with unspecified complications: Secondary | ICD-10-CM | POA: Diagnosis present

## 2022-10-22 DIAGNOSIS — Z87891 Personal history of nicotine dependence: Secondary | ICD-10-CM

## 2022-10-22 DIAGNOSIS — E1122 Type 2 diabetes mellitus with diabetic chronic kidney disease: Secondary | ICD-10-CM | POA: Diagnosis not present

## 2022-10-22 DIAGNOSIS — Z7985 Long-term (current) use of injectable non-insulin antidiabetic drugs: Secondary | ICD-10-CM

## 2022-10-22 DIAGNOSIS — I2584 Coronary atherosclerosis due to calcified coronary lesion: Secondary | ICD-10-CM | POA: Diagnosis not present

## 2022-10-22 DIAGNOSIS — Z79899 Other long term (current) drug therapy: Secondary | ICD-10-CM | POA: Diagnosis not present

## 2022-10-22 DIAGNOSIS — I249 Acute ischemic heart disease, unspecified: Secondary | ICD-10-CM | POA: Diagnosis not present

## 2022-10-22 DIAGNOSIS — E039 Hypothyroidism, unspecified: Secondary | ICD-10-CM | POA: Diagnosis not present

## 2022-10-22 DIAGNOSIS — F17200 Nicotine dependence, unspecified, uncomplicated: Secondary | ICD-10-CM | POA: Diagnosis not present

## 2022-10-22 DIAGNOSIS — F17210 Nicotine dependence, cigarettes, uncomplicated: Secondary | ICD-10-CM | POA: Diagnosis not present

## 2022-10-22 DIAGNOSIS — M1990 Unspecified osteoarthritis, unspecified site: Secondary | ICD-10-CM | POA: Diagnosis not present

## 2022-10-22 DIAGNOSIS — I214 Non-ST elevation (NSTEMI) myocardial infarction: Secondary | ICD-10-CM | POA: Diagnosis not present

## 2022-10-22 DIAGNOSIS — R079 Chest pain, unspecified: Secondary | ICD-10-CM | POA: Diagnosis not present

## 2022-10-22 DIAGNOSIS — Z79890 Hormone replacement therapy (postmenopausal): Secondary | ICD-10-CM

## 2022-10-22 DIAGNOSIS — Z91018 Allergy to other foods: Secondary | ICD-10-CM | POA: Diagnosis not present

## 2022-10-22 DIAGNOSIS — I130 Hypertensive heart and chronic kidney disease with heart failure and stage 1 through stage 4 chronic kidney disease, or unspecified chronic kidney disease: Secondary | ICD-10-CM | POA: Diagnosis not present

## 2022-10-22 DIAGNOSIS — K219 Gastro-esophageal reflux disease without esophagitis: Secondary | ICD-10-CM | POA: Diagnosis not present

## 2022-10-22 DIAGNOSIS — D692 Other nonthrombocytopenic purpura: Secondary | ICD-10-CM | POA: Diagnosis not present

## 2022-10-22 DIAGNOSIS — Z8261 Family history of arthritis: Secondary | ICD-10-CM

## 2022-10-22 DIAGNOSIS — Z7982 Long term (current) use of aspirin: Secondary | ICD-10-CM | POA: Diagnosis not present

## 2022-10-22 DIAGNOSIS — Z888 Allergy status to other drugs, medicaments and biological substances status: Secondary | ICD-10-CM | POA: Diagnosis not present

## 2022-10-22 DIAGNOSIS — I440 Atrioventricular block, first degree: Secondary | ICD-10-CM | POA: Diagnosis not present

## 2022-10-23 ENCOUNTER — Inpatient Hospital Stay (HOSPITAL_COMMUNITY): Payer: PPO

## 2022-10-23 DIAGNOSIS — Z794 Long term (current) use of insulin: Secondary | ICD-10-CM

## 2022-10-23 DIAGNOSIS — I214 Non-ST elevation (NSTEMI) myocardial infarction: Secondary | ICD-10-CM

## 2022-10-23 DIAGNOSIS — E118 Type 2 diabetes mellitus with unspecified complications: Secondary | ICD-10-CM

## 2022-10-23 DIAGNOSIS — I10 Essential (primary) hypertension: Secondary | ICD-10-CM | POA: Diagnosis not present

## 2022-10-23 HISTORY — DX: Non-ST elevation (NSTEMI) myocardial infarction: I21.4

## 2022-10-23 HISTORY — DX: Type 2 diabetes mellitus with unspecified complications: Z79.4

## 2022-10-23 HISTORY — DX: Type 2 diabetes mellitus with unspecified complications: E11.8

## 2022-10-23 LAB — CBC WITH DIFFERENTIAL/PLATELET
Abs Immature Granulocytes: 0.03 10*3/uL (ref 0.00–0.07)
Basophils Absolute: 0.1 10*3/uL (ref 0.0–0.1)
Basophils Relative: 2 %
Eosinophils Absolute: 0.2 10*3/uL (ref 0.0–0.5)
Eosinophils Relative: 2 %
HCT: 40.1 % (ref 39.0–52.0)
Hemoglobin: 13.6 g/dL (ref 13.0–17.0)
Immature Granulocytes: 0 %
Lymphocytes Relative: 28 %
Lymphs Abs: 2.6 10*3/uL (ref 0.7–4.0)
MCH: 29.8 pg (ref 26.0–34.0)
MCHC: 33.9 g/dL (ref 30.0–36.0)
MCV: 87.9 fL (ref 80.0–100.0)
Monocytes Absolute: 0.7 10*3/uL (ref 0.1–1.0)
Monocytes Relative: 7 %
Neutro Abs: 5.4 10*3/uL (ref 1.7–7.7)
Neutrophils Relative %: 61 %
Platelets: 246 10*3/uL (ref 150–400)
RBC: 4.56 MIL/uL (ref 4.22–5.81)
RDW: 13.1 % (ref 11.5–15.5)
WBC: 9.1 10*3/uL (ref 4.0–10.5)
nRBC: 0 % (ref 0.0–0.2)

## 2022-10-23 LAB — BASIC METABOLIC PANEL
Anion gap: 13 (ref 5–15)
BUN: 12 mg/dL (ref 8–23)
CO2: 21 mmol/L — ABNORMAL LOW (ref 22–32)
Calcium: 9.1 mg/dL (ref 8.9–10.3)
Chloride: 105 mmol/L (ref 98–111)
Creatinine, Ser: 0.86 mg/dL (ref 0.61–1.24)
GFR, Estimated: >60 mL/min (ref >60)
Glucose, Bld: 161 mg/dL — ABNORMAL HIGH (ref 70–99)
Potassium: 4.7 mmol/L (ref 3.5–5.1)
Sodium: 139 mmol/L (ref 135–145)

## 2022-10-23 LAB — HIV ANTIBODY (ROUTINE TESTING W REFLEX): HIV Screen 4th Generation wRfx: NONREACTIVE

## 2022-10-23 LAB — ECHOCARDIOGRAM COMPLETE
AR max vel: 3.09 cm2
AV Area VTI: 3.29 cm2
AV Area mean vel: 3.02 cm2
AV Mean grad: 2 mm[Hg]
AV Peak grad: 4.2 mm[Hg]
Ao pk vel: 1.02 m/s
Area-P 1/2: 4.36 cm2
Height: 72 in
MV VTI: 2.55 cm2
S' Lateral: 3.1 cm
Weight: 3407.43 [oz_av]

## 2022-10-23 LAB — GLUCOSE, CAPILLARY
Glucose-Capillary: 127 mg/dL — ABNORMAL HIGH (ref 70–99)
Glucose-Capillary: 125 mg/dL — ABNORMAL HIGH (ref 70–99)
Glucose-Capillary: 158 mg/dL — ABNORMAL HIGH (ref 70–99)
Glucose-Capillary: 175 mg/dL — ABNORMAL HIGH (ref 70–99)
Glucose-Capillary: 118 mg/dL — ABNORMAL HIGH (ref 70–99)

## 2022-10-23 LAB — HEMOGLOBIN A1C
Hgb A1c MFr Bld: 6.7 % — ABNORMAL HIGH (ref 4.8–5.6)
Mean Plasma Glucose: 145.59 mg/dL

## 2022-10-23 LAB — TROPONIN I (HIGH SENSITIVITY)
Troponin I (High Sensitivity): 202 ng/L (ref <18)
Troponin I (High Sensitivity): 243 ng/L (ref <18)

## 2022-10-23 LAB — HEPARIN LEVEL (UNFRACTIONATED): Heparin Unfractionated: 0.25 [IU]/mL — ABNORMAL LOW (ref 0.30–0.70)

## 2022-10-23 MED ORDER — ACETAMINOPHEN 325 MG PO TABS: 650.0000 mg | ORAL_TABLET | ORAL | Status: DC | PRN

## 2022-10-23 MED ORDER — MONTELUKAST SODIUM 10 MG PO TABS
10.0000 mg | ORAL_TABLET | Freq: Every day | ORAL | Status: DC
  Administered 2022-10-23 – 2022-10-25 (×3): 10 mg via ORAL
  Filled 2022-10-23 (×3): qty 1

## 2022-10-23 MED ORDER — ONDANSETRON HCL 4 MG/2ML IJ SOLN: 4.0000 mg | Freq: Four times a day (QID) | INTRAMUSCULAR | Status: DC | PRN

## 2022-10-23 MED ORDER — TORSEMIDE 20 MG PO TABS
20.0000 mg | ORAL_TABLET | Freq: Every day | ORAL | Status: DC
  Administered 2022-10-23 – 2022-10-24 (×2): 20 mg via ORAL
  Filled 2022-10-23 (×2): qty 1

## 2022-10-23 MED ORDER — INSULIN ASPART 100 UNIT/ML IJ SOLN
0.0000 [IU] | Freq: Three times a day (TID) | INTRAMUSCULAR | Status: DC
  Administered 2022-10-23: 1 [IU] via SUBCUTANEOUS
  Administered 2022-10-23: 2 [IU] via SUBCUTANEOUS
  Administered 2022-10-23: 1 [IU] via SUBCUTANEOUS
  Administered 2022-10-24: 5 [IU] via SUBCUTANEOUS
  Administered 2022-10-24 – 2022-10-25 (×3): 1 [IU] via SUBCUTANEOUS
  Administered 2022-10-26: 2 [IU] via SUBCUTANEOUS
  Administered 2022-10-26: 1 [IU] via SUBCUTANEOUS

## 2022-10-23 MED ORDER — ALBUTEROL SULFATE (2.5 MG/3ML) 0.083% IN NEBU: 2.5000 mg | INHALATION_SOLUTION | Freq: Four times a day (QID) | RESPIRATORY_TRACT | Status: DC | PRN

## 2022-10-23 MED ORDER — LOSARTAN POTASSIUM 25 MG PO TABS
25.0000 mg | ORAL_TABLET | Freq: Every day | ORAL | Status: DC
  Administered 2022-10-23: 25 mg via ORAL
  Filled 2022-10-23 (×2): qty 1

## 2022-10-23 MED ORDER — NITROGLYCERIN 0.4 MG SL SUBL: 0.4000 mg | SUBLINGUAL_TABLET | SUBLINGUAL | Status: DC | PRN

## 2022-10-23 MED ORDER — TAMSULOSIN HCL 0.4 MG PO CAPS
0.4000 mg | ORAL_CAPSULE | ORAL | Status: DC
  Administered 2022-10-23: 0.4 mg via ORAL
  Filled 2022-10-23: qty 1

## 2022-10-23 MED ORDER — NICOTINE 21 MG/24HR TD PT24
21.0000 mg | MEDICATED_PATCH | Freq: Every day | TRANSDERMAL | Status: DC
  Administered 2022-10-23 – 2022-10-26 (×4): 21 mg via TRANSDERMAL
  Filled 2022-10-23 (×4): qty 1

## 2022-10-23 MED ORDER — LEVOTHYROXINE SODIUM 100 MCG PO TABS
100.0000 ug | ORAL_TABLET | Freq: Every day | ORAL | Status: DC
  Administered 2022-10-23 – 2022-10-26 (×4): 100 ug via ORAL
  Filled 2022-10-23 (×4): qty 1

## 2022-10-23 MED ORDER — UMECLIDINIUM-VILANTEROL 62.5-25 MCG/ACT IN AEPB
1.0000 | INHALATION_SPRAY | Freq: Every day | RESPIRATORY_TRACT | Status: DC
  Administered 2022-10-23 – 2022-10-26 (×4): 1 via RESPIRATORY_TRACT
  Filled 2022-10-23: qty 14

## 2022-10-23 MED ORDER — ASPIRIN 81 MG PO TBEC
81.0000 mg | DELAYED_RELEASE_TABLET | Freq: Every day | ORAL | Status: DC
  Administered 2022-10-24: 81 mg via ORAL
  Filled 2022-10-23: qty 1

## 2022-10-23 MED ORDER — HEPARIN (PORCINE) 25000 UT/250ML-% IV SOLN
1450.0000 [IU]/h | INTRAVENOUS | Status: DC
  Administered 2022-10-23: 1200 [IU]/h via INTRAVENOUS
  Administered 2022-10-24: 1450 [IU]/h via INTRAVENOUS
  Administered 2022-10-24: 1400 [IU]/h via INTRAVENOUS
  Filled 2022-10-23 (×3): qty 250

## 2022-10-23 NOTE — Progress Notes (Signed)
Patient seen and examined.  Agree with plan per Dr Orson Aloe.  Presented with NSTEMI, currently chest pain free.  Continue heparin gtt.  Plan echo tomorrow, and cath on Monday

## 2022-10-23 NOTE — Progress Notes (Signed)
ANTICOAGULATION CONSULT NOTE - Initial Consult  Pharmacy Consult for heparin Indication:  chest pain/ACS  Allergies  Allergen Reactions   Other Anaphylaxis    Hazelnut NOELLE (sinus medication)   Penicillins Anaphylaxis   Sulfa Antibiotics Hives   Neosporin [Neomycin-Bacitracin Zn-Polymyx] Rash    REDNESS AROUND AREA APPLIED    Patient Measurements: Height: 6' (182.9 cm) Weight: 96.6 kg (212 lb 15.4 oz) IBW/kg (Calculated) : 77.6 Heparin Dosing Weight: 96.6 kg  Vital Signs: Temp: 97.8 F (36.6 C) (09/28 0603) Temp Source: Oral (09/28 0603) BP: 116/71 (09/28 0603) Pulse Rate: 69 (09/28 0606)  Labs: No results for input(s): "HGB", "HCT", "PLT", "APTT", "LABPROT", "INR", "HEPARINUNFRC", "HEPRLOWMOCWT", "CREATININE", "CKTOTAL", "CKMB", "TROPONINIHS" in the last 72 hours.  CrCl cannot be calculated (Patient's most recent lab result is older than the maximum 21 days allowed.).   Medical History: Past Medical History:  Diagnosis Date   Asthma    inhaler used only when seasonal allergies    Complication of anesthesia    patient states"gets rowdy" when wake up   COPD (chronic obstructive pulmonary disease) (HCC)    Diabetes mellitus without complication (HCC)    fasting blood sugar avg 120   Heart murmur    Hypertension    Hypothyroidism    Pneumonia    hx   Protein in urine    Shortness of breath dyspnea    hx    Medications:  Medications Prior to Admission  Medication Sig Dispense Refill Last Dose   albuterol (PROVENTIL HFA;VENTOLIN HFA) 108 (90 BASE) MCG/ACT inhaler Inhale 2 puffs into the lungs every 6 (six) hours as needed for wheezing or shortness of breath. For shortness of breath      ANORO ELLIPTA 62.5-25 MCG/ACT AEPB Inhale 1 puff into the lungs daily.      aspirin EC 81 MG tablet Take 81 mg by mouth daily.      Dulaglutide 3 MG/0.5ML SOPN Inject 3 mg into the skin once a week.      isosorbide mononitrate (IMDUR) 30 MG 24 hr tablet Take 1 tablet (30 mg  total) by mouth daily. 90 tablet 3    JANUMET XR 50-1000 MG TB24 Take 2 tablets by mouth daily.      ketoconazole (NIZORAL) 2 % cream Apply 1 Application topically 2 (two) times daily. Apply 1gm to heel areas twice daily and rub in well 60 g 2    LANTUS SOLOSTAR 100 UNIT/ML Solostar Pen Inject 25 Units into the skin daily.      levothyroxine (SYNTHROID) 100 MCG tablet Take 100 mcg by mouth daily before breakfast.      loratadine (CLARITIN) 10 MG tablet Take 10 mg by mouth daily.      losartan (COZAAR) 25 MG tablet Take 25 mg by mouth daily.      montelukast (SINGULAIR) 10 MG tablet Take 10 mg by mouth at bedtime.      nitroGLYCERIN (NITROSTAT) 0.4 MG SL tablet Place 0.4 mg under the tongue every 5 (five) minutes as needed for chest pain.      omeprazole (PRILOSEC) 20 MG capsule Take 20 mg by mouth daily.      ONETOUCH VERIO test strip USE 1 STRIP EVERY DAY (Patient taking differently: 1 each by Other route as needed for other (GLucose).) 50 each 4    rosuvastatin (CRESTOR) 10 MG tablet Take 10 mg by mouth daily.      tamsulosin (FLOMAX) 0.4 MG CAPS capsule Take 0.4 mg by mouth once a week.  torsemide (DEMADEX) 20 MG tablet Take 1 tablet (20 mg total) by mouth daily. 90 tablet 1    UNIFINE PENTIPS 32G X 4 MM MISC 1 each by Other route daily.      Vitamin D, Ergocalciferol, (DRISDOL) 1.25 MG (50000 UNIT) CAPS capsule Take 50,000 Units by mouth every 7 (seven) days.      Scheduled:   [START ON 10/24/2022] aspirin EC  81 mg Oral Daily   insulin aspart  0-9 Units Subcutaneous TID WC   levothyroxine  100 mcg Oral Q0600   losartan  25 mg Oral Daily   montelukast  10 mg Oral QHS   nicotine  21 mg Transdermal Daily   tamsulosin  0.4 mg Oral Weekly   torsemide  20 mg Oral Daily   umeclidinium-vilanterol  1 puff Inhalation Daily    Assessment: Scott Koch presenting with chest pain. Patient transferred from [facility]. Patient was given high dose aspirin and started on Lovenox  prior to transfer. Pharmacy consulted for initiation of heparin. Plan for coronary angiography on Monday.   Goal of Therapy:  Heparin level 0.3-0.7 units/ml Monitor platelets by anticoagulation protocol: Yes   Plan:  Start heparin infusion at 1200 units/hr Check anti-Xa level in 6 hours and daily while on heparin Monitor CBC and s/sx of bleeding daily  Enos Fling, PharmD PGY-1 Acute Care Pharmacy Resident 10/23/2022 8:30 AM

## 2022-10-23 NOTE — Progress Notes (Signed)
  Echocardiogram 2D Echocardiogram has been performed.  Neita Garnet Niobe Dick 10/23/2022, 1:24 PM

## 2022-10-23 NOTE — Progress Notes (Signed)
ANTICOAGULATION CONSULT NOTE  Pharmacy Consult for heparin Indication:  chest pain/ACS  Allergies  Allergen Reactions   Hazelnut (Filbert) Anaphylaxis   Norel Ad [Chlorphen-Pe-Acetaminophen] Anaphylaxis   Penicillins Anaphylaxis   Sulfa Antibiotics Hives   Neosporin [Neomycin-Bacitracin Zn-Polymyx] Rash and Other (See Comments)    Redness around application site    Patient Measurements: Height: 6' (182.9 cm) Weight: 96.6 kg (212 lb 15.4 oz) IBW/kg (Calculated) : 77.6 Heparin Dosing Weight: 96.6 kg  Vital Signs: Temp: 97.8 F (36.6 C) (09/28 0603) Temp Source: Oral (09/28 0603) BP: 116/71 (09/28 0603) Pulse Rate: 69 (09/28 0606)  Labs: Recent Labs    10/23/22 0843 10/23/22 1237  HGB 13.6  --   HCT 40.1  --   PLT 246  --   HEPARINUNFRC  --  0.25*  CREATININE 0.86  --   TROPONINIHS  --  202*    Estimated Creatinine Clearance: 99.1 mL/min (by C-G formula based on SCr of 0.86 mg/dL).   Medical History: Past Medical History:  Diagnosis Date   Asthma    inhaler used only when seasonal allergies    Complication of anesthesia    patient states"gets rowdy" when wake up   COPD (chronic obstructive pulmonary disease) (HCC)    Diabetes mellitus without complication (HCC)    fasting blood sugar avg 120   Heart murmur    Hypertension    Hypothyroidism    Pneumonia    hx   Protein in urine    Shortness of breath dyspnea    hx    Medications:  Medications Prior to Admission  Medication Sig Dispense Refill Last Dose   acetaminophen (TYLENOL) 500 MG tablet Take 1,000 mg by mouth 2 (two) times daily as needed for moderate pain, headache or fever.   Past Week   albuterol (PROVENTIL HFA;VENTOLIN HFA) 108 (90 BASE) MCG/ACT inhaler Inhale 2 puffs into the lungs every 6 (six) hours as needed for wheezing or shortness of breath. For shortness of breath   Past Week   aspirin EC 81 MG tablet Take 81 mg by mouth daily.   Past Week   Bioflavonoid Products (VITAMIN C) CHEW Chew  1 tablet by mouth daily.   Past Week   cetirizine (ZYRTEC) 10 MG tablet Take 10 mg by mouth daily.   Past Week   Dulaglutide (TRULICITY) 3 MG/0.5ML SOPN Inject 3 mg into the skin every Monday.   Past Week   ipratropium-albuterol (DUONEB) 0.5-2.5 (3) MG/3ML SOLN Take 3 mLs by nebulization every 6 (six) hours as needed (wheezing, shortness of breath).   UNK   isosorbide mononitrate (IMDUR) 30 MG 24 hr tablet Take 1 tablet (30 mg total) by mouth daily. (Patient taking differently: Take 30 mg by mouth See admin instructions. Take 30 mg once daily around mid-day.) 90 tablet 3 Past Week   JANUMET XR 50-1000 MG TB24 Take 1 tablet by mouth at bedtime.   Past Week   ketoconazole (NIZORAL) 2 % cream Apply 1 Application topically 2 (two) times daily. Apply 1gm to heel areas twice daily and rub in well (Patient taking differently: Apply 1 Application topically at bedtime.) 60 g 2 Past Week   LANTUS SOLOSTAR 100 UNIT/ML Solostar Pen Inject 20 Units into the skin daily as needed (BS > 120 in the morning).   Past Week   levothyroxine (SYNTHROID) 100 MCG tablet Take 100 mcg by mouth daily before breakfast.   Past Week   losartan (COZAAR) 25 MG tablet Take 25 mg by mouth  daily.   Past Week   montelukast (SINGULAIR) 10 MG tablet Take 10 mg by mouth at bedtime.   Past Week   nitroGLYCERIN (NITROSTAT) 0.4 MG SL tablet Place 0.4 mg under the tongue every 5 (five) minutes x 3 doses as needed for chest pain.   UNK   omeprazole (PRILOSEC) 20 MG capsule Take 20 mg by mouth daily.   Past Week   rosuvastatin (CRESTOR) 10 MG tablet Take 10 mg by mouth daily.   Past Week   tamsulosin (FLOMAX) 0.4 MG CAPS capsule Take 0.4 mg by mouth daily.   Past Week   torsemide (DEMADEX) 20 MG tablet Take 1 tablet (20 mg total) by mouth daily. 90 tablet 1 Past Week   Vitamin D, Ergocalciferol, (DRISDOL) 1.25 MG (50000 UNIT) CAPS capsule Take 50,000 Units by mouth every Friday.   Past Month   VITAMIN E PO Take 1 capsule by mouth daily.   Past  Week   Scheduled:   [START ON 10/24/2022] aspirin EC  81 mg Oral Daily   insulin aspart  0-9 Units Subcutaneous TID WC   levothyroxine  100 mcg Oral Q0600   losartan  25 mg Oral Daily   montelukast  10 mg Oral QHS   nicotine  21 mg Transdermal Daily   tamsulosin  0.4 mg Oral Weekly   torsemide  20 mg Oral Daily   umeclidinium-vilanterol  1 puff Inhalation Daily    Assessment: 67 YOM presenting with chest pain. Patient transferred from [facility]. Patient was given high dose aspirin and started on Lovenox prior to transfer. Pharmacy consulted for IV heparin. Plan for coronary angiography on Monday.   -heparin level= 0.25 on 1200 units/hr  Goal of Therapy:  Heparin level 0.3-0.7 units/ml Monitor platelets by anticoagulation protocol: Yes   Plan:  -Increase heparin to 1400 units/hr -Heparin level and CBC in am  Harland German, PharmD Clinical Pharmacist **Pharmacist phone directory can now be found on [email or web address] ([email or web address]).  Listed under [facility].

## 2022-10-23 NOTE — H&P (Signed)
Cardiology Admission History and Physical   Patient ID: Scott Koch MRN: [id number]; DOB: 03-05-1955   Admission date: 10/22/2022  PCP:  Jim Like, NP   [facility] Providers Cardiologist:  Norman Herrlich, MD        Chief Complaint:  Chest pain  Patient Profile:   Scott Koch is a 66 y.o. male with a significant past medical history of coronary artery disease (reports stent placed prior but unknown anatomy), hypertension, probably heart failure with preserved ejection fraction, COPD, insulin-dependent diabetes, and cigarette use who is being seen 10/23/2022 for the evaluation of chest pain.  History of Present Illness:   Scott Koch reports that he woke up around 1:30 am yesterday morning with chest pain.  The chest pain was at his left chest and radiated to his neck and left arm.  He describes the pain as ache without any other associated symptoms such as shortness of breath.  He denies any specific alleviating or aggravating factors.  Due to ongoing chest pain, his significant other drove him to [facility].  Patient's chest pain eventually resolved around 6 am yesterday and patient is now chest pain free.   At [facility], ECG performed was without signs of ischemia/infarction.  Troponin I was initially normal and went as high as 0.47.  Patient was given high-dose aspirin and started on Lovenox prior to transfer.  Patient was accepted by cardiology for possible coronary angiography assessment.     Past Medical History:  Diagnosis Date   Asthma    inhaler used only when seasonal allergies    Complication of anesthesia    patient states"gets rowdy" when wake up   COPD (chronic obstructive pulmonary disease) (HCC)    Diabetes mellitus without complication (HCC)    fasting blood sugar avg 120   Heart murmur    Hypertension    Hypothyroidism    Pneumonia    hx   Protein in urine    Shortness of breath dyspnea    hx    Past  Surgical History:  Procedure Laterality Date   ANTERIOR CERVICAL DECOMP/DISCECTOMY FUSION  01/13/2012   Procedure: ANTERIOR CERVICAL DECOMPRESSION/DISCECTOMY FUSION 3 LEVELS;  Surgeon: Mariam Dollar, MD;  Location: [facility];  Service: Neurosurgery;  Laterality: N/A;  Cervical three-four, cervical four five, cervical six-seven anterior cervical decompression fusion with plate    CORONARY ANGIOPLASTY  2000   stent   SHOULDER ARTHROSCOPY Right 14     Medications Prior to Admission: Prior to Admission medications   Medication Sig Start Date End Date Taking? Authorizing Provider  isosorbide mononitrate (IMDUR) 30 MG 24 hr tablet Take 1 tablet (30 mg total) by mouth daily. 07/30/22 10/28/22  Baldo Daub, MD  albuterol (PROVENTIL HFA;VENTOLIN HFA) 108 (90 BASE) MCG/ACT inhaler Inhale 2 puffs into the lungs every 6 (six) hours as needed for wheezing or shortness of breath. For shortness of breath    [provider]  ANORO ELLIPTA 62.5-25 MCG/ACT AEPB Inhale 1 puff into the lungs daily. 02/24/22   [provider]  aspirin EC 81 MG tablet Take 81 mg by mouth daily.    [provider]  Dulaglutide 3 MG/0.5ML SOPN Inject 3 mg into the skin once a week.    [provider]  JANUMET XR 50-1000 MG TB24 Take 2 tablets by mouth daily. 11/24/20   [provider]  ketoconazole (NIZORAL) 2 % cream Apply 1 Application topically 2 (two) times daily.  Apply 1gm to heel areas twice daily and rub in well 10/07/22   McCaughan, Dia D, DPM  LANTUS SOLOSTAR 100 UNIT/ML Solostar Pen Inject 25 Units into the skin daily. 01/27/21   [provider]  levothyroxine (SYNTHROID) 100 MCG tablet Take 100 mcg by mouth daily before breakfast.    [provider]  loratadine (CLARITIN) 10 MG tablet Take 10 mg by mouth daily.    [provider]  losartan (COZAAR) 25 MG tablet Take 25 mg by mouth daily. 02/25/21   [provider]  montelukast (SINGULAIR) 10 MG  tablet Take 10 mg by mouth at bedtime.    [provider]  nitroGLYCERIN (NITROSTAT) 0.4 MG SL tablet Place 0.4 mg under the tongue every 5 (five) minutes as needed for chest pain.    [provider]  omeprazole (PRILOSEC) 20 MG capsule Take 20 mg by mouth daily. 02/25/21   [provider]  ONETOUCH VERIO test strip USE 1 STRIP EVERY DAY Patient taking differently: 1 each by Other route as needed for other (GLucose). 10/17/21   Cox, Fritzi Mandes, MD  rosuvastatin (CRESTOR) 10 MG tablet Take 10 mg by mouth daily.    [provider]  tamsulosin (FLOMAX) 0.4 MG CAPS capsule Take 0.4 mg by mouth once a week. 09/15/21   [provider]  torsemide (DEMADEX) 20 MG tablet Take 1 tablet (20 mg total) by mouth daily. 04/07/22   Baldo Daub, MD  UNIFINE PENTIPS 32G X 4 MM MISC 1 each by Other route daily. 03/03/22   [provider]  Vitamin D, Ergocalciferol, (DRISDOL) 1.25 MG (50000 UNIT) CAPS capsule Take 50,000 Units by mouth every 7 (seven) days.    [provider]     Allergies:    Allergies  Allergen Reactions   Other Anaphylaxis    Hazelnut NOELLE (sinus medication)   Penicillins Anaphylaxis   Sulfa Antibiotics Hives   Neosporin [Neomycin-Bacitracin Zn-Polymyx] Rash    REDNESS AROUND AREA APPLIED    Social History:   Social History   Socioeconomic History   Marital status: Married    Spouse name: Not on file   Number of children: Not on file   Years of education: Not on file   Highest education level: Not on file  Occupational History   Not on file  Tobacco Use   Smoking status: Former    Current packs/day: 0.00    Average packs/day: 1 pack/day for 35.0 years (35.0 ttl pk-yrs)    Types: Cigarettes    Start date: 12/04/1978    Quit date: 12/03/2013    Years since quitting: 8.8   Smokeless tobacco: Never   Tobacco comments:    nicotine patch  Substance and Sexual Activity   Alcohol use: No   Drug use: No   Sexual  activity: Not on file  Other Topics Concern   Not on file  Social History Narrative   Not on file   Social Determinants of Health   Financial Resource Strain: Not on file  Food Insecurity: No Food Insecurity (10/23/2022)   Hunger Vital Sign    Worried About Running Out of Food in the Last Year: Never true    Ran Out of Food in the Last Year: Never true  Transportation Needs: No Transportation Needs (10/23/2022)   PRAPARE - Administrator, Civil Service (Medical): No    Lack of Transportation (Non-Medical): No  Physical Activity: Not on file  Stress: Not on file  Social  Connections: Not on file  Intimate Partner Violence: Not At Risk (10/23/2022)   Humiliation, Afraid, Rape, and Kick questionnaire    Fear of Current or Ex-Partner: No    Emotionally Abused: No    Physically Abused: No    Sexually Abused: No    Family History:   The patient's family history includes Arthritis in his brother; Heart attack in his mother; Heart disease in his brother and mother; Hepatitis in his mother; Hypertension in his brother and mother; Jaundice in his mother; Prostate cancer in his father.    ROS:  Please see the history of present illness.  All other ROS reviewed and negative.     Physical Exam/Data:   Vitals:   10/23/22 0603 10/23/22 0606  BP: 116/71   Pulse: 66 69  Resp: 18   Temp: 97.8 F (36.6 C)   TempSrc: Oral   SpO2: 94% 96%   No intake or output data in the 24 hours ending 10/23/22 0722    07/28/2022    7:47 AM 07/22/2022   11:13 AM 10/20/2021    7:59 AM  Last 3 Weights  Weight (lbs) 213 lb 213 lb 6.4 oz 243 lb 9.6 oz  Weight (kg) 96.616 kg 96.798 kg 110.496 kg     There is no height or weight on file to calculate BMI.  General:  Well nourished, well developed, in no acute distress HEENT: normal Neck: no JVD Vascular: No carotid bruits; Distal pulses 2+ bilaterally   Cardiac:  normal S1, S2; RRR; no murmur  Lungs:  clear to auscultation bilaterally, no  wheezing, rhonchi or rales  Abd: soft, nontender, no hepatomegaly  Ext: no edema Musculoskeletal:  No deformities, BUE and BLE strength normal and equal Skin: warm and dry  Neuro:  CNs 2-12 intact, no focal abnormalities noted Psych:  Normal affect    EKG:  The ECG that was done 10/22/22 (17:33 hrs)  was personally reviewed and demonstrates NSR; normal ECG  Relevant CV Studies: # SPECT pharmacologic stress test 07/28/22:   Findings are consistent with no ischemia and no infarction. The study is low risk.   No ST deviation was noted.   Left ventricular function is normal. End diastolic cavity size is normal.   Prior study available for comparison from 01/15/2021.  Laboratory Data:  High Sensitivity Troponin:  No results for input(s): "TROPONINIHS" in the last 720 hours.    ChemistryNo results for input(s): "NA", "K", "CL", "CO2", "GLUCOSE", "BUN", "CREATININE", "CALCIUM", "MG", "GFRNONAA", "GFRAA", "ANIONGAP" in the last 168 hours.  No results for input(s): "PROT", "ALBUMIN", "AST", "ALT", "ALKPHOS", "BILITOT" in the last 168 hours. Lipids No results for input(s): "CHOL", "TRIG", "HDL", "LABVLDL", "LDLCALC", "CHOLHDL" in the last 168 hours. HematologyNo results for input(s): "WBC", "RBC", "HGB", "HCT", "MCV", "MCH", "MCHC", "RDW", "PLT" in the last 168 hours. Thyroid No results for input(s): "TSH", "FREET4" in the last 168 hours. BNPNo results for input(s): "BNP", "PROBNP" in the last 168 hours.  DDimer No results for input(s): "DDIMER" in the last 168 hours.   Radiology/Studies:  No results found.   Assessment and Plan:   Chest pain: Likely secondary to NSTEMI-ACS given rise in Troponin I.  ECG without signs of ischemia/infarction and patient hemodynamically stable.  Currently without chest pain.  Initiated on Lovenox and given aspirin at outside facility.   --Start heparin drip in place of Lovenox. --Continue aspirin 81 mg daily. --Arrange for echocardiogram. --Coronary  angiography on Monday.    Hypertension: Primary hypertension without over  end-organ damage.  Blood pressure at this time is controlled.  On appropriate blood pressure medications. --Continue home medications.    Heart failure with preserved ejection fraction: Reported heart failure with preserved ejection fraction.  Currently taking torsemide for fluid management.  Patient does not take spironolactone or SGLT2 inhibitor at home.  Without acute heart failure syndrome or hemodynamic congestion on examination.   --Further management outpatient.    Type II diabetes: Insulin-dependent diabetes.   --Initiate glycemic control protocol.   --Hold home Janumet given pending coronary angiography.    Cigarette use: --Nicotine patch.    Risk Assessment/Risk Scores:  2  TIMI Risk Score for Unstable Angina or Non-ST Elevation MI:   The patient's TIMI risk score is 6, which indicates a 41% risk of all cause mortality, new or recurrent myocardial infarction or need for urgent revascularization in the next 14 days.      Code Status: Full Code  Severity of Illness: The appropriate patient status for this patient is INPATIENT. Inpatient status is judged to be reasonable and necessary in order to provide the required intensity of service to ensure the patient's safety. The patient's presenting symptoms, physical exam findings, and initial radiographic and laboratory data in the context of their chronic comorbidities is felt to place them at high risk for further clinical deterioration. Furthermore, it is not anticipated that the patient will be medically stable for discharge from the hospital within 2 midnights of admission.   * I certify that at the point of admission it is my clinical judgment that the patient will require inpatient hospital care spanning beyond 2 midnights from the point of admission due to high intensity of service, high risk for further deterioration and high frequency of surveillance  required.*   For questions or updates, please contact [facility] Please consult [email or web address] for contact info under     Signed, Judie Grieve, MD  10/23/2022 7:22 AM

## 2022-10-23 NOTE — Progress Notes (Addendum)
Date and time results received: 10/23/22 1350  Test: Troponin Critical Value: 202  Name of Provider Notified: Yvonna Alanis, PA  Orders Received? Or Actions Taken?:  no new orders received

## 2022-10-24 ENCOUNTER — Other Ambulatory Visit: Payer: Self-pay

## 2022-10-24 DIAGNOSIS — I5032 Chronic diastolic (congestive) heart failure: Secondary | ICD-10-CM

## 2022-10-24 DIAGNOSIS — I214 Non-ST elevation (NSTEMI) myocardial infarction: Secondary | ICD-10-CM | POA: Diagnosis not present

## 2022-10-24 DIAGNOSIS — I10 Essential (primary) hypertension: Secondary | ICD-10-CM | POA: Diagnosis not present

## 2022-10-24 HISTORY — DX: Chronic diastolic (congestive) heart failure: I50.32

## 2022-10-24 LAB — BASIC METABOLIC PANEL
Anion gap: 8 (ref 5–15)
BUN: 13 mg/dL (ref 8–23)
CO2: 27 mmol/L (ref 22–32)
Calcium: 9 mg/dL (ref 8.9–10.3)
Chloride: 102 mmol/L (ref 98–111)
Creatinine, Ser: 1.01 mg/dL (ref 0.61–1.24)
GFR, Estimated: >60 mL/min (ref >60)
Glucose, Bld: 143 mg/dL — ABNORMAL HIGH (ref 70–99)
Potassium: 3.8 mmol/L (ref 3.5–5.1)
Sodium: 137 mmol/L (ref 135–145)

## 2022-10-24 LAB — HEPARIN LEVEL (UNFRACTIONATED): Heparin Unfractionated: 0.3 [IU]/mL (ref 0.30–0.70)

## 2022-10-24 LAB — CBC
HCT: 41.5 % (ref 39.0–52.0)
Hemoglobin: 14 g/dL (ref 13.0–17.0)
MCH: 29.9 pg (ref 26.0–34.0)
MCHC: 33.7 g/dL (ref 30.0–36.0)
MCV: 88.5 fL (ref 80.0–100.0)
Platelets: 244 10*3/uL (ref 150–400)
RBC: 4.69 MIL/uL (ref 4.22–5.81)
RDW: 13.1 % (ref 11.5–15.5)
WBC: 12.5 10*3/uL — ABNORMAL HIGH (ref 4.0–10.5)
nRBC: 0 % (ref 0.0–0.2)

## 2022-10-24 LAB — LIPID PANEL
Cholesterol: 129 mg/dL (ref 0–200)
HDL: 33 mg/dL — ABNORMAL LOW (ref >40)
LDL Cholesterol: 67 mg/dL (ref 0–99)
Total CHOL/HDL Ratio: 3.9 {ratio}
Triglycerides: 147 mg/dL (ref <150)
VLDL: 29 mg/dL (ref 0–40)

## 2022-10-24 LAB — GLUCOSE, CAPILLARY
Glucose-Capillary: 149 mg/dL — ABNORMAL HIGH (ref 70–99)
Glucose-Capillary: 98 mg/dL (ref 70–99)
Glucose-Capillary: 137 mg/dL — ABNORMAL HIGH (ref 70–99)
Glucose-Capillary: 262 mg/dL — ABNORMAL HIGH (ref 70–99)
Glucose-Capillary: 135 mg/dL — ABNORMAL HIGH (ref 70–99)

## 2022-10-24 MED ORDER — ASPIRIN 81 MG PO CHEW
81.0000 mg | CHEWABLE_TABLET | ORAL | Status: AC
  Administered 2022-10-25: 81 mg via ORAL
  Filled 2022-10-24: qty 1

## 2022-10-24 MED ORDER — SODIUM CHLORIDE 0.9 % WEIGHT BASED INFUSION: 1.0000 mL/kg/h | INTRAVENOUS | Status: DC

## 2022-10-24 MED ORDER — INFLUENZA VAC A&B SURF ANT ADJ 0.5 ML IM SUSY
0.5000 mL | PREFILLED_SYRINGE | Freq: Once | INTRAMUSCULAR | Status: AC
  Administered 2022-10-25: 0.5 mL via INTRAMUSCULAR
  Filled 2022-10-24: qty 0.5

## 2022-10-24 MED ORDER — SODIUM CHLORIDE 0.9 % WEIGHT BASED INFUSION
3.0000 mL/kg/h | INTRAVENOUS | Status: DC
  Administered 2022-10-25: 3 mL/kg/h via INTRAVENOUS

## 2022-10-24 MED ORDER — ASPIRIN 81 MG PO TBEC
81.0000 mg | DELAYED_RELEASE_TABLET | Freq: Every day | ORAL | Status: DC
  Administered 2022-10-26: 81 mg via ORAL
  Filled 2022-10-24: qty 1

## 2022-10-24 NOTE — Progress Notes (Signed)
ANTICOAGULATION CONSULT NOTE  Pharmacy Consult for heparin Indication:  chest pain/ACS  Allergies  Allergen Reactions   Hazelnut (Filbert) Anaphylaxis   Norel Ad [Chlorphen-Pe-Acetaminophen] Anaphylaxis   Penicillins Anaphylaxis   Sulfa Antibiotics Hives   Neosporin [Neomycin-Bacitracin Zn-Polymyx] Rash and Other (See Comments)    Redness around application site    Patient Measurements: Height: 6' (182.9 cm) Weight: 96.6 kg (212 lb 15.4 oz) IBW/kg (Calculated) : 77.6 Heparin Dosing Weight: 96.6 kg  Vital Signs: Temp: 98.1 F (36.7 C) (09/29 0333) Temp Source: Oral (09/29 0333) BP: 103/68 (09/29 0333) Pulse Rate: 66 (09/29 0333)  Labs: Recent Labs    10/23/22 0843 10/23/22 1237 10/23/22 1347 10/24/22 0359  HGB 13.6  --   --  14.0  HCT 40.1  --   --  41.5  PLT 246  --   --  244  HEPARINUNFRC  --  0.25*  --  0.30  CREATININE 0.86  --   --  1.01  TROPONINIHS  --  202* 243*  --     Estimated Creatinine Clearance: 84.4 mL/min (by C-G formula based on SCr of 1.01 mg/dL).   Medical History: Past Medical History:  Diagnosis Date   Asthma    inhaler used only when seasonal allergies    Complication of anesthesia    patient states"gets rowdy" when wake up   COPD (chronic obstructive pulmonary disease) (HCC)    Diabetes mellitus without complication (HCC)    fasting blood sugar avg 120   Heart murmur    Hypertension    Hypothyroidism    Pneumonia    hx   Protein in urine    Shortness of breath dyspnea    hx    Medications:  Medications Prior to Admission  Medication Sig Dispense Refill Last Dose   acetaminophen (TYLENOL) 500 MG tablet Take 1,000 mg by mouth 2 (two) times daily as needed for moderate pain, headache or fever.   Past Week   albuterol (PROVENTIL HFA;VENTOLIN HFA) 108 (90 BASE) MCG/ACT inhaler Inhale 2 puffs into the lungs every 6 (six) hours as needed for wheezing or shortness of breath. For shortness of breath   Past Week   aspirin EC 81 MG  tablet Take 81 mg by mouth daily.   Past Week   Bioflavonoid Products (VITAMIN C) CHEW Chew 1 tablet by mouth daily.   Past Week   cetirizine (ZYRTEC) 10 MG tablet Take 10 mg by mouth daily.   Past Week   Dulaglutide (TRULICITY) 3 MG/0.5ML SOPN Inject 3 mg into the skin every Monday.   Past Week   ipratropium-albuterol (DUONEB) 0.5-2.5 (3) MG/3ML SOLN Take 3 mLs by nebulization every 6 (six) hours as needed (wheezing, shortness of breath).   UNK   isosorbide mononitrate (IMDUR) 30 MG 24 hr tablet Take 1 tablet (30 mg total) by mouth daily. (Patient taking differently: Take 30 mg by mouth See admin instructions. Take 30 mg once daily around mid-day.) 90 tablet 3 Past Week   JANUMET XR 50-1000 MG TB24 Take 1 tablet by mouth at bedtime.   Past Week   ketoconazole (NIZORAL) 2 % cream Apply 1 Application topically 2 (two) times daily. Apply 1gm to heel areas twice daily and rub in well (Patient taking differently: Apply 1 Application topically at bedtime.) 60 g 2 Past Week   LANTUS SOLOSTAR 100 UNIT/ML Solostar Pen Inject 20 Units into the skin daily as needed (BS > 120 in the morning).   Past Week   levothyroxine (  SYNTHROID) 100 MCG tablet Take 100 mcg by mouth daily before breakfast.   Past Week   losartan (COZAAR) 25 MG tablet Take 25 mg by mouth daily.   Past Week   montelukast (SINGULAIR) 10 MG tablet Take 10 mg by mouth at bedtime.   Past Week   nitroGLYCERIN (NITROSTAT) 0.4 MG SL tablet Place 0.4 mg under the tongue every 5 (five) minutes x 3 doses as needed for chest pain.   UNK   omeprazole (PRILOSEC) 20 MG capsule Take 20 mg by mouth daily.   Past Week   rosuvastatin (CRESTOR) 10 MG tablet Take 10 mg by mouth daily.   Past Week   tamsulosin (FLOMAX) 0.4 MG CAPS capsule Take 0.4 mg by mouth daily.   Past Week   torsemide (DEMADEX) 20 MG tablet Take 1 tablet (20 mg total) by mouth daily. 90 tablet 1 Past Week   Vitamin D, Ergocalciferol, (DRISDOL) 1.25 MG (50000 UNIT) CAPS capsule Take 50,000  Units by mouth every Friday.   Past Month   VITAMIN E PO Take 1 capsule by mouth daily.   Past Week   Scheduled:   aspirin EC  81 mg Oral Daily   influenza vaccine adjuvanted  0.5 mL Intramuscular Once   insulin aspart  0-9 Units Subcutaneous TID WC   levothyroxine  100 mcg Oral Q0600   losartan  25 mg Oral Daily   montelukast  10 mg Oral QHS   nicotine  21 mg Transdermal Daily   tamsulosin  0.4 mg Oral Weekly   torsemide  20 mg Oral Daily   umeclidinium-vilanterol  1 puff Inhalation Daily    Assessment: 67 YOM presenting with chest pain. Patient transferred from [facility]. Patient was given high dose aspirin and started on Lovenox prior to transfer. Pharmacy consulted for IV heparin. Plan for coronary angiography on Monday.   9/29 AM: Heparin level 0.3, borderline subtherapeutic on 1400 units/hr. No signs of bleeding noted or issues with infusion running per RN. CBC stable (Hgb 14.0, Plt 244).   Goal of Therapy:  Heparin level 0.3-0.7 units/ml Monitor platelets by anticoagulation protocol: Yes   Plan:  -Increase heparin slightly to 1450 units/hr -Heparin level and CBC daily -Monitor for signs and symptoms of bleeding daily  Enos Fling, PharmD PGY-1 Acute Care Pharmacy Resident 10/24/2022 7:34 AM

## 2022-10-24 NOTE — Progress Notes (Signed)
Informed Consent   Shared Decision Making/Informed Consent{ The risks [stroke (1 in 1000), death (1 in 1000), kidney failure [usually temporary] (1 in 500), bleeding (1 in 200), allergic reaction [possibly serious] (1 in 200)], benefits (diagnostic support and management of coronary artery disease) and alternatives of a cardiac catheterization were discussed in detail with Scott Koch and he is willing to proceed.

## 2022-10-24 NOTE — Progress Notes (Signed)
Patient's BP 98/66.  Spoke to [employer] PA at bedside he stated to give [facility] but to hold losartan.

## 2022-10-24 NOTE — Progress Notes (Signed)
Rounding Note    Patient Name: Scott Koch Date of Encounter: 10/24/2022  [facility] Cardiologist: Norman Herrlich, MD   Subjective   Denies any chest pain or dyspnea  Inpatient Medications    Scheduled Meds:  aspirin EC  81 mg Oral Daily   influenza vaccine adjuvanted  0.5 mL Intramuscular Once   insulin aspart  0-9 Units Subcutaneous TID WC   levothyroxine  100 mcg Oral Q0600   losartan  25 mg Oral Daily   montelukast  10 mg Oral QHS   nicotine  21 mg Transdermal Daily   tamsulosin  0.4 mg Oral Weekly   torsemide  20 mg Oral Daily   umeclidinium-vilanterol  1 puff Inhalation Daily   Continuous Infusions:  heparin 1,450 Units/hr (10/24/22 0904)   PRN Meds: acetaminophen, albuterol, nitroGLYCERIN, ondansetron (ZOFRAN) IV   Vital Signs    Vitals:   10/23/22 1501 10/23/22 2044 10/24/22 0333 10/24/22 0838  BP: (!) 99/54 102/63 103/68   Pulse: 71 69 66 77  Resp: 18 18 18 18   Temp: 98 F (36.7 C) 97.7 F (36.5 C) 98.1 F (36.7 C)   TempSrc: Oral Oral Oral   SpO2: 98% 95% 92% 94%  Weight:      Height:        Intake/Output Summary (Last 24 hours) at 10/24/2022 1232 Last data filed at 10/24/2022 0700 Gross per 24 hour  Intake 397.04 ml  Output 325 ml  Net 72.04 ml      10/23/2022    8:04 AM 07/28/2022    7:47 AM 07/22/2022   11:13 AM  Last 3 Weights  Weight (lbs) 212 lb 15.4 oz 213 lb 213 lb 6.4 oz  Weight (kg) 96.6 kg 96.616 kg 96.798 kg      Telemetry    NSR - Personally Reviewed  ECG    NO new ECG - Personally Reviewed  Physical Exam   GEN: No acute distress.   Neck: No JVD Cardiac: RRR, no murmurs, rubs, or gallops.  Respiratory: Clear to auscultation bilaterally. GI: Soft, nontender, non-distended  MS: No edema; No deformity. Neuro:  Nonfocal  Psych: Normal affect   Labs    High Sensitivity Troponin:   Recent Labs  Lab 10/23/22 1237 10/23/22 1347  TROPONINIHS 202* 243*     Chemistry Recent Labs  Lab 10/23/22 0843  10/24/22 0359  NA 139 137  K 4.7 3.8  CL 105 102  CO2 21* 27  GLUCOSE 161* 143*  BUN 12 13  CREATININE 0.86 1.01  CALCIUM 9.1 9.0  GFRNONAA >60 >60  ANIONGAP 13 8    Lipids  Recent Labs  Lab 10/24/22 0359  CHOL 129  TRIG 147  HDL 33*  LDLCALC 67  CHOLHDL 3.9    Hematology Recent Labs  Lab 10/23/22 0843 10/24/22 0359  WBC 9.1 12.5*  RBC 4.56 4.69  HGB 13.6 14.0  HCT 40.1 41.5  MCV 87.9 88.5  MCH 29.8 29.9  MCHC 33.9 33.7  RDW 13.1 13.1  PLT 246 244   Thyroid No results for input(s): "TSH", "FREET4" in the last 168 hours.  BNPNo results for input(s): "BNP", "PROBNP" in the last 168 hours.  DDimer No results for input(s): "DDIMER" in the last 168 hours.   Radiology    ECHOCARDIOGRAM COMPLETE  Result Date: 10/23/2022    ECHOCARDIOGRAM REPORT   Patient Name:   Scott Koch Date of Exam: 10/23/2022 Medical Rec #:  [id number]    Height:  72.0 in Accession #:    [id number]   Weight:       213.0 lb Date of Birth:  11-Jan-1956    BSA:          2.188 m Patient Age:    67 years     BP:           116/71 mmHg Patient Gender: M            HR:           63 bpm. Exam Location:  Inpatient Procedure: 2D Echo, Color Doppler, Cardiac Doppler and Strain Analysis Indications:    NSTEMI I21.4  History:        Patient has prior history of Echocardiogram examinations.                 Signs/Symptoms:Murmur; Risk Factors:Hypertension.  Sonographer:    Neysa Bonito Roar Referring Phys: [id number] [facility] H HENDERSON  Sonographer Comments: Global longitudinal strain was attempted. IMPRESSIONS  1. Left ventricular ejection fraction, by estimation, is 55 to 60%. The left ventricle has normal function. The left ventricle has no regional wall motion abnormalities. Left ventricular diastolic parameters were normal. The average left ventricular global longitudinal strain is -19.0 %. The global longitudinal strain is normal.  2. Right ventricular systolic function is normal. The right ventricular size is normal.  There is normal pulmonary artery systolic pressure.  3. The mitral valve is normal in structure. No evidence of mitral valve regurgitation.  4. The aortic valve is normal in structure. Aortic valve regurgitation is not visualized.  5. The inferior vena cava is normal in size with greater than 50% respiratory variability, suggesting right atrial pressure of 3 mmHg. Conclusion(s)/Recommendation(s): Normal biventricular function without evidence of hemodynamically significant valvular heart disease. FINDINGS  Left Ventricle: Left ventricular ejection fraction, by estimation, is 55 to 60%. The left ventricle has normal function. The left ventricle has no regional wall motion abnormalities. The average left ventricular global longitudinal strain is -19.0 %. The global longitudinal strain is normal. The left ventricular internal cavity size was normal in size. There is no left ventricular hypertrophy. Left ventricular diastolic parameters were normal. Right Ventricle: The right ventricular size is normal. Right ventricular systolic function is normal. There is normal pulmonary artery systolic pressure. The tricuspid regurgitant velocity is 2.35 m/s, and with an assumed right atrial pressure of 3 mmHg,  the estimated right ventricular systolic pressure is 25.1 mmHg. Left Atrium: Left atrial size was normal in size. Right Atrium: Right atrial size was normal in size. Pericardium: There is no evidence of pericardial effusion. Mitral Valve: The mitral valve is normal in structure. No evidence of mitral valve regurgitation. MV peak gradient, 2.9 mmHg. The mean mitral valve gradient is 2.0 mmHg. Tricuspid Valve: Tricuspid valve regurgitation is mild. Aortic Valve: The aortic valve is normal in structure. Aortic valve regurgitation is not visualized. Aortic valve mean gradient measures 2.0 mmHg. Aortic valve peak gradient measures 4.2 mmHg. Aortic valve area, by VTI measures 3.29 cm. Pulmonic Valve: Pulmonic valve regurgitation  is not visualized. Aorta: The aortic root and ascending aorta are structurally normal, with no evidence of dilitation. Venous: The inferior vena cava is normal in size with greater than 50% respiratory variability, suggesting right atrial pressure of 3 mmHg. IAS/Shunts: No atrial level shunt detected by color flow Doppler.  LEFT VENTRICLE PLAX 2D LVIDd:         4.40 cm   Diastology LVIDs:  3.10 cm   LV e' medial:    9.57 cm/s LV PW:         0.90 cm   LV E/e' medial:  9.5 LV IVS:        1.00 cm   LV e' lateral:   13.90 cm/s LVOT diam:     2.10 cm   LV E/e' lateral: 6.5 LV SV:         61 LV SV Index:   28        2D Longitudinal Strain LVOT Area:     3.46 cm  2D Strain GLS Avg:     -19.0 %  RIGHT VENTRICLE RV Basal diam:  3.30 cm RV Mid diam:    2.80 cm RV S prime:     15.10 cm/s TAPSE (M-mode): 2.2 cm LEFT ATRIUM             Index        RIGHT ATRIUM           Index LA diam:        3.40 cm 1.55 cm/m   RA Area:     14.70 cm LA Vol (A2C):   45.7 ml 20.88 ml/m  RA Volume:   34.60 ml  15.81 ml/m LA Vol (A4C):   31.5 ml 14.40 ml/m LA Biplane Vol: 39.6 ml 18.10 ml/m  AORTIC VALVE                    PULMONIC VALVE AV Area (Vmax):    3.09 cm     PV Vmax:          1.01 m/s AV Area (Vmean):   3.02 cm     PV Peak grad:     4.1 mmHg AV Area (VTI):     3.29 cm     PR End Diast Vel: 3.76 msec AV Vmax:           102.00 cm/s  RVOT Peak grad:   2 mmHg AV Vmean:          69.600 cm/s AV VTI:            0.184 m AV Peak Grad:      4.2 mmHg AV Mean Grad:      2.0 mmHg LVOT Vmax:         91.00 cm/s LVOT Vmean:        60.700 cm/s LVOT VTI:          0.175 m LVOT/AV VTI ratio: 0.95  AORTA Ao Sinus diam: 2.90 cm Ao STJ diam:   2.7 cm Ao Asc diam:   2.90 cm MITRAL VALVE               TRICUSPID VALVE MV Area (PHT): 4.36 cm    TR Peak grad:   22.1 mmHg MV Area VTI:   2.55 cm    TR Vmax:        235.00 cm/s MV Peak grad:  2.9 mmHg MV Mean grad:  2.0 mmHg    SHUNTS MV Vmax:       0.85 m/s    Systemic VTI:  0.18 m MV Vmean:       60.2 cm/s   Systemic Diam: 2.10 cm MV Decel Time: 174 msec MV E velocity: 90.70 cm/s MV A velocity: 93.30 cm/s MV E/A ratio:  0.97 Photographer signed by Carolan Clines Signature Date/Time: 10/23/2022/1:54:42 PM    Final     Cardiac Studies  Patient Profile     67 y.o. male  with a significant past medical history of coronary artery disease (reports stent placed prior but unknown anatomy), hypertension, probably heart failure with preserved ejection fraction, COPD, insulin-dependent diabetes, and cigarette use who is being seen 10/23/2022 for the evaluation of chest pain.   Assessment & Plan    NSTEMI: Presents with chest pain, mild troponin elevation.  Echo shows EF 55 to 60%.  Currently chest pain-free.  He reports a history of prior PCI over 20 years ago but unknown anatomy. -Continue heparin drip -Continue aspirin 81 mg daily -Continue rosuvastatin 10 mg daily -As needed sublingual nitroglycerin -Cath planned for tomorrow.  Risks and benefits of cardiac catheterization have been discussed with the patient.  These include bleeding, infection, kidney damage, stroke, heart attack, death.  The patient understands these risks and is willing to proceed.   Hypertension: Continue home losartan 25 mg daily  Hyperlipidemia: Continue rosuvastatin.  LDL 67  Chronic diastolic heart failure: Appears euvolemic.  Continue home torsemide 20 mg daily  T2DM: On SSI.  A1c 6.7  Tobacco use: Cessation strongly recommended   For questions or updates, please contact [facility] Please consult [email or web address] for contact info under        Signed, Little Ishikawa, MD  10/24/2022, 12:32 PM

## 2022-10-25 ENCOUNTER — Other Ambulatory Visit (HOSPITAL_COMMUNITY): Payer: Self-pay

## 2022-10-25 ENCOUNTER — Encounter (HOSPITAL_COMMUNITY)
Admission: EM | Disposition: A | Discharge status: Home / Self Care | Payer: Self-pay | Source: Other Acute Inpatient Hospital | Attending: Internal Medicine

## 2022-10-25 ENCOUNTER — Encounter (HOSPITAL_COMMUNITY): Payer: Self-pay | Admitting: Internal Medicine

## 2022-10-25 DIAGNOSIS — I2510 Atherosclerotic heart disease of native coronary artery without angina pectoris: Secondary | ICD-10-CM | POA: Diagnosis not present

## 2022-10-25 DIAGNOSIS — I214 Non-ST elevation (NSTEMI) myocardial infarction: Secondary | ICD-10-CM | POA: Diagnosis not present

## 2022-10-25 HISTORY — PX: CORONARY STENT INTERVENTION: CATH118234

## 2022-10-25 HISTORY — PX: CORONARY LITHOTRIPSY: CATH118330

## 2022-10-25 HISTORY — PX: LEFT HEART CATH AND CORONARY ANGIOGRAPHY: CATH118249

## 2022-10-25 LAB — CBC
HCT: 41.3 % (ref 39.0–52.0)
Hemoglobin: 14 g/dL (ref 13.0–17.0)
MCH: 30 pg (ref 26.0–34.0)
MCHC: 33.9 g/dL (ref 30.0–36.0)
MCV: 88.4 fL (ref 80.0–100.0)
Platelets: 224 10*3/uL (ref 150–400)
RBC: 4.67 MIL/uL (ref 4.22–5.81)
RDW: 13.1 % (ref 11.5–15.5)
WBC: 9.4 10*3/uL (ref 4.0–10.5)
nRBC: 0 % (ref 0.0–0.2)

## 2022-10-25 LAB — BASIC METABOLIC PANEL
Anion gap: 7 (ref 5–15)
BUN: 14 mg/dL (ref 8–23)
CO2: 27 mmol/L (ref 22–32)
Calcium: 9 mg/dL (ref 8.9–10.3)
Chloride: 103 mmol/L (ref 98–111)
Creatinine, Ser: 1.28 mg/dL — ABNORMAL HIGH (ref 0.61–1.24)
GFR, Estimated: >60 mL/min (ref >60)
Glucose, Bld: 157 mg/dL — ABNORMAL HIGH (ref 70–99)
Potassium: 4.3 mmol/L (ref 3.5–5.1)
Sodium: 137 mmol/L (ref 135–145)

## 2022-10-25 LAB — POCT ACTIVATED CLOTTING TIME
Activated Clotting Time: 324 s
Activated Clotting Time: 238 s

## 2022-10-25 LAB — GLUCOSE, CAPILLARY
Glucose-Capillary: 115 mg/dL — ABNORMAL HIGH (ref 70–99)
Glucose-Capillary: 124 mg/dL — ABNORMAL HIGH (ref 70–99)
Glucose-Capillary: 126 mg/dL — ABNORMAL HIGH (ref 70–99)
Glucose-Capillary: 123 mg/dL — ABNORMAL HIGH (ref 70–99)

## 2022-10-25 LAB — LIPOPROTEIN A (LPA): Lipoprotein (a): 289.3 nmol/L — ABNORMAL HIGH (ref <75.0)

## 2022-10-25 LAB — HEPARIN LEVEL (UNFRACTIONATED): Heparin Unfractionated: 0.36 [IU]/mL (ref 0.30–0.70)

## 2022-10-25 SURGERY — LEFT HEART CATH AND CORONARY ANGIOGRAPHY
Anesthesia: LOCAL

## 2022-10-25 MED ORDER — SODIUM CHLORIDE 0.9 % IV SOLN: INTRAVENOUS | Status: AC

## 2022-10-25 MED ORDER — TICAGRELOR 90 MG PO TABS
90.0000 mg | ORAL_TABLET | Freq: Two times a day (BID) | ORAL | Status: DC
  Administered 2022-10-25 – 2022-10-26 (×2): 90 mg via ORAL
  Filled 2022-10-25 (×2): qty 1

## 2022-10-25 MED ORDER — SODIUM CHLORIDE 0.9% FLUSH: 3.0000 mL | INTRAVENOUS | Status: DC | PRN

## 2022-10-25 MED ORDER — SODIUM CHLORIDE 0.9 % IV SOLN
INTRAVENOUS | Status: AC | PRN
  Administered 2022-10-25: 500 mL via INTRAVENOUS

## 2022-10-25 MED ORDER — ROSUVASTATIN CALCIUM 20 MG PO TABS
20.0000 mg | ORAL_TABLET | Freq: Every day | ORAL | Status: DC
  Administered 2022-10-25 – 2022-10-26 (×2): 20 mg via ORAL
  Filled 2022-10-25: qty 4
  Filled 2022-10-25: qty 1

## 2022-10-25 MED ORDER — NITROGLYCERIN 1 MG/10 ML FOR IR/CATH LAB
INTRA_ARTERIAL | Status: AC
  Filled 2022-10-25: qty 10

## 2022-10-25 MED ORDER — LIDOCAINE HCL (PF) 1 % IJ SOLN
INTRAMUSCULAR | Status: DC | PRN
  Administered 2022-10-25: 2 mL

## 2022-10-25 MED ORDER — SODIUM CHLORIDE 0.9% FLUSH
3.0000 mL | Freq: Two times a day (BID) | INTRAVENOUS | Status: DC
  Administered 2022-10-25: 3 mL via INTRAVENOUS

## 2022-10-25 MED ORDER — VERAPAMIL HCL 2.5 MG/ML IV SOLN
INTRAVENOUS | Status: AC
  Filled 2022-10-25: qty 2

## 2022-10-25 MED ORDER — PHENYLEPHRINE HCL-NACL 20-0.9 MG/250ML-% IV SOLN: INTRAVENOUS | Status: DC | PRN

## 2022-10-25 MED ORDER — SODIUM CHLORIDE 0.9 % IV SOLN: 250.0000 mL | INTRAVENOUS | Status: DC | PRN

## 2022-10-25 MED ORDER — LABETALOL HCL 5 MG/ML IV SOLN: 10.0000 mg | INTRAVENOUS | Status: AC | PRN

## 2022-10-25 MED ORDER — TICAGRELOR 90 MG PO TABS
ORAL_TABLET | ORAL | Status: DC | PRN
  Administered 2022-10-25: 180 mg via ORAL

## 2022-10-25 MED ORDER — HEPARIN (PORCINE) IN NACL 1000-0.9 UT/500ML-% IV SOLN
INTRAVENOUS | Status: DC | PRN
  Administered 2022-10-25 (×2): 500 mL

## 2022-10-25 MED ORDER — NITROGLYCERIN 1 MG/10 ML FOR IR/CATH LAB
INTRA_ARTERIAL | Status: DC | PRN
  Administered 2022-10-25: 200 ug via INTRACORONARY

## 2022-10-25 MED ORDER — HYDRALAZINE HCL 20 MG/ML IJ SOLN: 10.0000 mg | INTRAMUSCULAR | Status: AC | PRN

## 2022-10-25 MED ORDER — IOHEXOL 350 MG/ML SOLN
INTRAVENOUS | Status: DC | PRN
  Administered 2022-10-25: 75 mL

## 2022-10-25 MED ORDER — FENTANYL CITRATE (PF) 100 MCG/2ML IJ SOLN
INTRAMUSCULAR | Status: DC | PRN
  Administered 2022-10-25 (×3): 25 ug via INTRAVENOUS

## 2022-10-25 MED ORDER — FENTANYL CITRATE (PF) 100 MCG/2ML IJ SOLN
INTRAMUSCULAR | Status: AC
  Filled 2022-10-25: qty 2

## 2022-10-25 MED ORDER — MIDAZOLAM HCL 2 MG/2ML IJ SOLN
INTRAMUSCULAR | Status: DC | PRN
  Administered 2022-10-25 (×3): 1 mg via INTRAVENOUS

## 2022-10-25 MED ORDER — LIDOCAINE HCL (PF) 1 % IJ SOLN
INTRAMUSCULAR | Status: AC
  Filled 2022-10-25: qty 30

## 2022-10-25 MED ORDER — HEPARIN SODIUM (PORCINE) 1000 UNIT/ML IJ SOLN
INTRAMUSCULAR | Status: AC
  Filled 2022-10-25: qty 10

## 2022-10-25 MED ORDER — TICAGRELOR 90 MG PO TABS
ORAL_TABLET | ORAL | Status: AC
  Filled 2022-10-25: qty 2

## 2022-10-25 MED ORDER — MIDAZOLAM HCL 2 MG/2ML IJ SOLN
INTRAMUSCULAR | Status: AC
  Filled 2022-10-25: qty 2

## 2022-10-25 MED ORDER — PHENYLEPHRINE 200 MCG/ML (NON-ED) FOR PRIAPISM
INTRAMUSCULAR | Status: DC | PRN
  Administered 2022-10-25: 160 ug via INTRACAVERNOUS

## 2022-10-25 MED ORDER — MORPHINE SULFATE (PF) 2 MG/ML IV SOLN: 2.0000 mg | INTRAVENOUS | Status: DC | PRN

## 2022-10-25 MED ORDER — HEPARIN SODIUM (PORCINE) 1000 UNIT/ML IJ SOLN
INTRAMUSCULAR | Status: DC | PRN
  Administered 2022-10-25: 5000 [IU] via INTRAVENOUS
  Administered 2022-10-25: 3000 [IU] via INTRAVENOUS
  Administered 2022-10-25: 5000 [IU] via INTRAVENOUS

## 2022-10-25 MED ORDER — VERAPAMIL HCL 2.5 MG/ML IV SOLN
INTRAVENOUS | Status: DC | PRN
  Administered 2022-10-25: 10 mL via INTRA_ARTERIAL

## 2022-10-25 MED ORDER — PHENYLEPHRINE 80 MCG/ML (10ML) SYRINGE FOR IV PUSH (FOR BLOOD PRESSURE SUPPORT)
PREFILLED_SYRINGE | INTRAVENOUS | Status: AC
  Filled 2022-10-25: qty 10

## 2022-10-25 SURGICAL SUPPLY — 27 items
BALL SAPPHIRE NC24 3.5X18 (BALLOONS) ×1
BALL SAPPHIRE NC24 3.5X8 (BALLOONS) ×1
BALLN SAPPHIRE 2.5X20 (BALLOONS) ×1
BALLN SAPPHIRE 3.25X20 (BALLOONS) ×1
BALLOON SAPPHIRE 2.5X20 (BALLOONS) IMPLANT
BALLOON SAPPHIRE 3.25X20 (BALLOONS) IMPLANT
BALLOON SAPPHIRE NC24 3.5X18 (BALLOONS) IMPLANT
BALLOON SAPPHIRE NC24 3.5X8 (BALLOONS) IMPLANT
CATH INFINITI AMBI 5FR TG (CATHETERS) IMPLANT
CATH SHOCKWAVE C2 3.5X12 (CATHETERS) IMPLANT
CATH VISTA GUIDE 6FR JR4 (CATHETERS) IMPLANT
DEVICE RAD COMP TR BAND LRG (VASCULAR PRODUCTS) IMPLANT
GLIDESHEATH SLEND SS 6F .021 (SHEATH) IMPLANT
GUIDEWIRE INQWIRE 1.5J.035X260 (WIRE) IMPLANT
INQWIRE 1.5J .035X260CM (WIRE) ×1
KIT ENCORE 26 ADVANTAGE (KITS) IMPLANT
PACK CARDIAC CATHETERIZATION (CUSTOM PROCEDURE TRAY) ×2 IMPLANT
PROTECTION STATION PRESSURIZED (MISCELLANEOUS) ×1
SET ATX-X65L (MISCELLANEOUS) IMPLANT
SHEATH PROBE COVER 6X72 (BAG) IMPLANT
STATION PROTECTION PRESSURIZED (MISCELLANEOUS) IMPLANT
STENT SYNERGY XD 3.0X38 (Permanent Stent) IMPLANT
STENT SYNERGY XD 3.50X38 (Permanent Stent) IMPLANT
SYNERGY XD 3.0X38 (Permanent Stent) ×1 IMPLANT
SYNERGY XD 3.50X38 (Permanent Stent) IMPLANT
TUBING CIL FLEX 10 FLL-RA (TUBING) IMPLANT
WIRE ASAHI PROWATER 180CM (WIRE) IMPLANT

## 2022-10-25 NOTE — TOC CM/SW Note (Signed)
Transition of Care [facility]) - Inpatient Brief Assessment   Patient Details  Name: Scott Koch MRN: [id number] Date of Birth: 21-May-1955  Transition of Care [facility]) CM/SW Contact:    Gala Lewandowsky, RN Phone Number: 10/25/2022, 11:26 AM   Clinical Narrative: Patient presented for chest pain-cardiac cath today. PTA patient was independent from home with spouse. No DME in the home. Patient has insurance and PCP. No further needs identified at this time.    Transition of Care Asessment: Insurance and Status: Insurance coverage has been reviewed Patient has primary care physician: Yes Prior/Current Home Services: No current home services Social Determinants of Health Reivew: SDOH reviewed no interventions necessary Readmission risk has been reviewed: Yes Transition of care needs: no transition of care needs at this time

## 2022-10-25 NOTE — Discharge Summary (Signed)
ECHOCARDIOGRAM REPORT   Patient Name:   Scott Koch Date of Exam: 10/23/2022 Medical Rec #:  [id number]    Height:       72.0 in Accession #:    [id number]   Weight:       213.0 lb Date of Birth:  Mar 16, 1955    BSA:          2.188 m Patient Age:    67 years     BP:           116/71 mmHg Patient Gender: M            HR:           63 bpm. Exam Location:  Inpatient Procedure: 2D Echo, Color Doppler, Cardiac Doppler and Strain Analysis Indications:    NSTEMI I21.4  History:        Patient has prior history of Echocardiogram examinations.                 Signs/Symptoms:Murmur; Risk Factors:Hypertension.  Sonographer:    Neysa Bonito Roar Referring Phys: [id number] [facility] H HENDERSON  Sonographer Comments: Global longitudinal strain was attempted. IMPRESSIONS  1. Left ventricular ejection fraction, by estimation, is 55 to 60%. The left ventricle has normal function. The left ventricle has no regional wall motion abnormalities. Left ventricular diastolic parameters were normal. The average left ventricular global longitudinal strain is  -19.0 %. The global longitudinal strain is normal.  2. Right ventricular systolic function is normal. The right ventricular size is normal. There is normal pulmonary artery systolic pressure.  3. The mitral valve is normal in structure. No evidence of mitral valve regurgitation.  4. The aortic valve is normal in structure. Aortic valve regurgitation is not visualized.  5. The inferior vena cava is normal in size with greater than 50% respiratory variability, suggesting right atrial pressure of 3 mmHg. Conclusion(s)/Recommendation(s): Normal biventricular function without evidence of hemodynamically significant valvular heart disease. FINDINGS  Left Ventricle: Left ventricular ejection fraction, by estimation, is 55 to 60%. The left ventricle has normal function. The left ventricle has no regional wall motion abnormalities. The average left ventricular global longitudinal strain is -19.0 %. The global longitudinal strain is normal. The left ventricular internal cavity size was normal in size. There is no left ventricular hypertrophy. Left ventricular diastolic parameters were normal. Right Ventricle: The right ventricular size is normal. Right ventricular systolic function is normal. There is normal pulmonary artery systolic pressure. The tricuspid regurgitant velocity is 2.35 m/s, and with an assumed right atrial pressure of 3 mmHg,  the estimated right ventricular systolic pressure is 25.1 mmHg. Left Atrium: Left atrial size was normal in size. Right Atrium: Right atrial size was normal in size. Pericardium: There is no evidence of pericardial effusion. Mitral Valve: The mitral valve is normal in structure. No evidence of mitral valve regurgitation. MV peak gradient, 2.9 mmHg. The mean mitral valve gradient is 2.0 mmHg. Tricuspid Valve: Tricuspid valve regurgitation is mild. Aortic Valve: The aortic valve is normal in structure. Aortic valve regurgitation is not visualized. Aortic valve mean gradient measures 2.0  mmHg. Aortic valve peak gradient measures 4.2 mmHg. Aortic valve area, by VTI measures 3.29 cm. Pulmonic Valve: Pulmonic valve regurgitation is not visualized. Aorta: The aortic root and ascending aorta are structurally normal, with no evidence of dilitation. Venous: The inferior vena cava is normal in size with greater than 50% respiratory variability, suggesting right atrial pressure of 3 mmHg. IAS/Shunts: No atrial level shunt detected by color flow  Discharge Summary    Patient ID: Scott Koch MRN: [id number]; DOB: [birth year]  Admit date: 10/22/2022 Discharge date: 10/25/2022  PCP:  Jim Like, NP   [facility] Providers Cardiologist:  Norman Herrlich, MD    Discharge Diagnoses    Principal Problem:   NSTEMI (non-ST elevated myocardial infarction) [facility]) Active Problems:   Type 2 diabetes mellitus with complication, with long-term current use of insulin (HCC)   Chronic diastolic heart failure (HCC)    Diagnostic Studies/Procedures    Echocardiogram 10/23/22 1. Left ventricular ejection fraction, by estimation, is 55 to 60%. The  left ventricle has normal function. The left ventricle has no regional  wall motion abnormalities. Left ventricular diastolic parameters were  normal. The average left ventricular  global longitudinal strain is -19.0 %. The global longitudinal strain is  normal.   2. Right ventricular systolic function is normal. The right ventricular  size is normal. There is normal pulmonary artery systolic pressure.   3. The mitral valve is normal in structure. No evidence of mitral valve  regurgitation.   4. The aortic valve is normal in structure. Aortic valve regurgitation is  not visualized.   5. The inferior vena cava is normal in size with greater than 50%  respiratory variability, suggesting right atrial pressure of 3 mmHg.   Conclusion(s)/Recommendation(s): Normal biventricular function without  evidence of hemodynamically significant valvular heart disease.   Left Heart Catheterization 10/25/22   Culprit Lesion Segment: prox RCA-1 lesion is 60% stenosed. Mid RCA lesion is 85% stenosed.  Prox RCA-2 lesion is 80% stenosed.   Following Balloon PTCA and Shockwave Lithotripsy, a drug-eluting stent was successfully placed covering all 3 lesions of culprit segment, using a SYNERGY XD 3.0X38 => postdilated to 3.6 mm   Post intervention, there is a 0% residual stenosis in the distal 2 lesions  with 10% residual stenosis in the heavily calcified eccentric 60% segment   -------------------------------------------------------------------------   Mid LM to Prox LAD lesion is 25% stenosed.   -------------------------------------------------------------------------   The left ventricular systolic function is normal, by echocardiogram; LV end diastolic pressure is normal.   ==========================================   POST-CATH FINDINGS Severe single-vessel CAD with heavily calcified eccentric proximal RCA 60% tapering to a 80% shelflike lesion followed by a napkin ring 85% stenosis Successful Shockwave Lithotripsy based DES PCI of the RCA (Synergy XD 3.0 mm x 38 mm postdilated to 3.6 mm), reducing stenoses to a few focal areas of 10% but otherwise 0% with TIMI-3 flow restored Otherwise moderate calcification in the distal LM-proximal LAD and LCx but at most 30% proximal LAD stenosis. Normal LVEDP (preserved EF by echo)      RECOMMENDATIONS   Continue titrate aggressive GDMT for CAD   Recommend uninterrupted dual antiplatelet therapy with Aspirin 81mg  daily and Ticagrelor 90mg  twice daily for a minimum of 12 months (ACS-Class I recommendation).   After 1 year, okay to DC ASA and continue with SAPT either with maintenance dose ticagrelor 60 mg twice daily or converting to complete a grill 75 mg daily.   In the absence of any other complications or medical issues, we expect the patient to be ready for discharge from an interventional cardiology          perspective on 10/25/2022. Diagnostic Dominance: Right  Intervention   _____________   History of Present Illness     Mahesh Sizemore is a 67 y.o. male with at past medical history of CAD (reports stent placed prior), HTN, probable HFpEF, COPD, insulin-dependent  ECHOCARDIOGRAM REPORT   Patient Name:   Scott Koch Date of Exam: 10/23/2022 Medical Rec #:  [id number]    Height:       72.0 in Accession #:    [id number]   Weight:       213.0 lb Date of Birth:  Mar 16, 1955    BSA:          2.188 m Patient Age:    67 years     BP:           116/71 mmHg Patient Gender: M            HR:           63 bpm. Exam Location:  Inpatient Procedure: 2D Echo, Color Doppler, Cardiac Doppler and Strain Analysis Indications:    NSTEMI I21.4  History:        Patient has prior history of Echocardiogram examinations.                 Signs/Symptoms:Murmur; Risk Factors:Hypertension.  Sonographer:    Neysa Bonito Roar Referring Phys: [id number] [facility] H HENDERSON  Sonographer Comments: Global longitudinal strain was attempted. IMPRESSIONS  1. Left ventricular ejection fraction, by estimation, is 55 to 60%. The left ventricle has normal function. The left ventricle has no regional wall motion abnormalities. Left ventricular diastolic parameters were normal. The average left ventricular global longitudinal strain is  -19.0 %. The global longitudinal strain is normal.  2. Right ventricular systolic function is normal. The right ventricular size is normal. There is normal pulmonary artery systolic pressure.  3. The mitral valve is normal in structure. No evidence of mitral valve regurgitation.  4. The aortic valve is normal in structure. Aortic valve regurgitation is not visualized.  5. The inferior vena cava is normal in size with greater than 50% respiratory variability, suggesting right atrial pressure of 3 mmHg. Conclusion(s)/Recommendation(s): Normal biventricular function without evidence of hemodynamically significant valvular heart disease. FINDINGS  Left Ventricle: Left ventricular ejection fraction, by estimation, is 55 to 60%. The left ventricle has normal function. The left ventricle has no regional wall motion abnormalities. The average left ventricular global longitudinal strain is -19.0 %. The global longitudinal strain is normal. The left ventricular internal cavity size was normal in size. There is no left ventricular hypertrophy. Left ventricular diastolic parameters were normal. Right Ventricle: The right ventricular size is normal. Right ventricular systolic function is normal. There is normal pulmonary artery systolic pressure. The tricuspid regurgitant velocity is 2.35 m/s, and with an assumed right atrial pressure of 3 mmHg,  the estimated right ventricular systolic pressure is 25.1 mmHg. Left Atrium: Left atrial size was normal in size. Right Atrium: Right atrial size was normal in size. Pericardium: There is no evidence of pericardial effusion. Mitral Valve: The mitral valve is normal in structure. No evidence of mitral valve regurgitation. MV peak gradient, 2.9 mmHg. The mean mitral valve gradient is 2.0 mmHg. Tricuspid Valve: Tricuspid valve regurgitation is mild. Aortic Valve: The aortic valve is normal in structure. Aortic valve regurgitation is not visualized. Aortic valve mean gradient measures 2.0  mmHg. Aortic valve peak gradient measures 4.2 mmHg. Aortic valve area, by VTI measures 3.29 cm. Pulmonic Valve: Pulmonic valve regurgitation is not visualized. Aorta: The aortic root and ascending aorta are structurally normal, with no evidence of dilitation. Venous: The inferior vena cava is normal in size with greater than 50% respiratory variability, suggesting right atrial pressure of 3 mmHg. IAS/Shunts: No atrial level shunt detected by color flow  Discharge Summary    Patient ID: Scott Koch MRN: [id number]; DOB: [birth year]  Admit date: 10/22/2022 Discharge date: 10/25/2022  PCP:  Jim Like, NP   [facility] Providers Cardiologist:  Norman Herrlich, MD    Discharge Diagnoses    Principal Problem:   NSTEMI (non-ST elevated myocardial infarction) [facility]) Active Problems:   Type 2 diabetes mellitus with complication, with long-term current use of insulin (HCC)   Chronic diastolic heart failure (HCC)    Diagnostic Studies/Procedures    Echocardiogram 10/23/22 1. Left ventricular ejection fraction, by estimation, is 55 to 60%. The  left ventricle has normal function. The left ventricle has no regional  wall motion abnormalities. Left ventricular diastolic parameters were  normal. The average left ventricular  global longitudinal strain is -19.0 %. The global longitudinal strain is  normal.   2. Right ventricular systolic function is normal. The right ventricular  size is normal. There is normal pulmonary artery systolic pressure.   3. The mitral valve is normal in structure. No evidence of mitral valve  regurgitation.   4. The aortic valve is normal in structure. Aortic valve regurgitation is  not visualized.   5. The inferior vena cava is normal in size with greater than 50%  respiratory variability, suggesting right atrial pressure of 3 mmHg.   Conclusion(s)/Recommendation(s): Normal biventricular function without  evidence of hemodynamically significant valvular heart disease.   Left Heart Catheterization 10/25/22   Culprit Lesion Segment: prox RCA-1 lesion is 60% stenosed. Mid RCA lesion is 85% stenosed.  Prox RCA-2 lesion is 80% stenosed.   Following Balloon PTCA and Shockwave Lithotripsy, a drug-eluting stent was successfully placed covering all 3 lesions of culprit segment, using a SYNERGY XD 3.0X38 => postdilated to 3.6 mm   Post intervention, there is a 0% residual stenosis in the distal 2 lesions  with 10% residual stenosis in the heavily calcified eccentric 60% segment   -------------------------------------------------------------------------   Mid LM to Prox LAD lesion is 25% stenosed.   -------------------------------------------------------------------------   The left ventricular systolic function is normal, by echocardiogram; LV end diastolic pressure is normal.   ==========================================   POST-CATH FINDINGS Severe single-vessel CAD with heavily calcified eccentric proximal RCA 60% tapering to a 80% shelflike lesion followed by a napkin ring 85% stenosis Successful Shockwave Lithotripsy based DES PCI of the RCA (Synergy XD 3.0 mm x 38 mm postdilated to 3.6 mm), reducing stenoses to a few focal areas of 10% but otherwise 0% with TIMI-3 flow restored Otherwise moderate calcification in the distal LM-proximal LAD and LCx but at most 30% proximal LAD stenosis. Normal LVEDP (preserved EF by echo)      RECOMMENDATIONS   Continue titrate aggressive GDMT for CAD   Recommend uninterrupted dual antiplatelet therapy with Aspirin 81mg  daily and Ticagrelor 90mg  twice daily for a minimum of 12 months (ACS-Class I recommendation).   After 1 year, okay to DC ASA and continue with SAPT either with maintenance dose ticagrelor 60 mg twice daily or converting to complete a grill 75 mg daily.   In the absence of any other complications or medical issues, we expect the patient to be ready for discharge from an interventional cardiology          perspective on 10/25/2022. Diagnostic Dominance: Right  Intervention   _____________   History of Present Illness     Mahesh Sizemore is a 67 y.o. male with at past medical history of CAD (reports stent placed prior), HTN, probable HFpEF, COPD, insulin-dependent  Discharge Summary    Patient ID: Scott Koch MRN: [id number]; DOB: [birth year]  Admit date: 10/22/2022 Discharge date: 10/25/2022  PCP:  Jim Like, NP   [facility] Providers Cardiologist:  Norman Herrlich, MD    Discharge Diagnoses    Principal Problem:   NSTEMI (non-ST elevated myocardial infarction) [facility]) Active Problems:   Type 2 diabetes mellitus with complication, with long-term current use of insulin (HCC)   Chronic diastolic heart failure (HCC)    Diagnostic Studies/Procedures    Echocardiogram 10/23/22 1. Left ventricular ejection fraction, by estimation, is 55 to 60%. The  left ventricle has normal function. The left ventricle has no regional  wall motion abnormalities. Left ventricular diastolic parameters were  normal. The average left ventricular  global longitudinal strain is -19.0 %. The global longitudinal strain is  normal.   2. Right ventricular systolic function is normal. The right ventricular  size is normal. There is normal pulmonary artery systolic pressure.   3. The mitral valve is normal in structure. No evidence of mitral valve  regurgitation.   4. The aortic valve is normal in structure. Aortic valve regurgitation is  not visualized.   5. The inferior vena cava is normal in size with greater than 50%  respiratory variability, suggesting right atrial pressure of 3 mmHg.   Conclusion(s)/Recommendation(s): Normal biventricular function without  evidence of hemodynamically significant valvular heart disease.   Left Heart Catheterization 10/25/22   Culprit Lesion Segment: prox RCA-1 lesion is 60% stenosed. Mid RCA lesion is 85% stenosed.  Prox RCA-2 lesion is 80% stenosed.   Following Balloon PTCA and Shockwave Lithotripsy, a drug-eluting stent was successfully placed covering all 3 lesions of culprit segment, using a SYNERGY XD 3.0X38 => postdilated to 3.6 mm   Post intervention, there is a 0% residual stenosis in the distal 2 lesions  with 10% residual stenosis in the heavily calcified eccentric 60% segment   -------------------------------------------------------------------------   Mid LM to Prox LAD lesion is 25% stenosed.   -------------------------------------------------------------------------   The left ventricular systolic function is normal, by echocardiogram; LV end diastolic pressure is normal.   ==========================================   POST-CATH FINDINGS Severe single-vessel CAD with heavily calcified eccentric proximal RCA 60% tapering to a 80% shelflike lesion followed by a napkin ring 85% stenosis Successful Shockwave Lithotripsy based DES PCI of the RCA (Synergy XD 3.0 mm x 38 mm postdilated to 3.6 mm), reducing stenoses to a few focal areas of 10% but otherwise 0% with TIMI-3 flow restored Otherwise moderate calcification in the distal LM-proximal LAD and LCx but at most 30% proximal LAD stenosis. Normal LVEDP (preserved EF by echo)      RECOMMENDATIONS   Continue titrate aggressive GDMT for CAD   Recommend uninterrupted dual antiplatelet therapy with Aspirin 81mg  daily and Ticagrelor 90mg  twice daily for a minimum of 12 months (ACS-Class I recommendation).   After 1 year, okay to DC ASA and continue with SAPT either with maintenance dose ticagrelor 60 mg twice daily or converting to complete a grill 75 mg daily.   In the absence of any other complications or medical issues, we expect the patient to be ready for discharge from an interventional cardiology          perspective on 10/25/2022. Diagnostic Dominance: Right  Intervention   _____________   History of Present Illness     Mahesh Sizemore is a 67 y.o. male with at past medical history of CAD (reports stent placed prior), HTN, probable HFpEF, COPD, insulin-dependent  ECHOCARDIOGRAM REPORT   Patient Name:   Scott Koch Date of Exam: 10/23/2022 Medical Rec #:  [id number]    Height:       72.0 in Accession #:    [id number]   Weight:       213.0 lb Date of Birth:  Mar 16, 1955    BSA:          2.188 m Patient Age:    67 years     BP:           116/71 mmHg Patient Gender: M            HR:           63 bpm. Exam Location:  Inpatient Procedure: 2D Echo, Color Doppler, Cardiac Doppler and Strain Analysis Indications:    NSTEMI I21.4  History:        Patient has prior history of Echocardiogram examinations.                 Signs/Symptoms:Murmur; Risk Factors:Hypertension.  Sonographer:    Neysa Bonito Roar Referring Phys: [id number] [facility] H HENDERSON  Sonographer Comments: Global longitudinal strain was attempted. IMPRESSIONS  1. Left ventricular ejection fraction, by estimation, is 55 to 60%. The left ventricle has normal function. The left ventricle has no regional wall motion abnormalities. Left ventricular diastolic parameters were normal. The average left ventricular global longitudinal strain is  -19.0 %. The global longitudinal strain is normal.  2. Right ventricular systolic function is normal. The right ventricular size is normal. There is normal pulmonary artery systolic pressure.  3. The mitral valve is normal in structure. No evidence of mitral valve regurgitation.  4. The aortic valve is normal in structure. Aortic valve regurgitation is not visualized.  5. The inferior vena cava is normal in size with greater than 50% respiratory variability, suggesting right atrial pressure of 3 mmHg. Conclusion(s)/Recommendation(s): Normal biventricular function without evidence of hemodynamically significant valvular heart disease. FINDINGS  Left Ventricle: Left ventricular ejection fraction, by estimation, is 55 to 60%. The left ventricle has normal function. The left ventricle has no regional wall motion abnormalities. The average left ventricular global longitudinal strain is -19.0 %. The global longitudinal strain is normal. The left ventricular internal cavity size was normal in size. There is no left ventricular hypertrophy. Left ventricular diastolic parameters were normal. Right Ventricle: The right ventricular size is normal. Right ventricular systolic function is normal. There is normal pulmonary artery systolic pressure. The tricuspid regurgitant velocity is 2.35 m/s, and with an assumed right atrial pressure of 3 mmHg,  the estimated right ventricular systolic pressure is 25.1 mmHg. Left Atrium: Left atrial size was normal in size. Right Atrium: Right atrial size was normal in size. Pericardium: There is no evidence of pericardial effusion. Mitral Valve: The mitral valve is normal in structure. No evidence of mitral valve regurgitation. MV peak gradient, 2.9 mmHg. The mean mitral valve gradient is 2.0 mmHg. Tricuspid Valve: Tricuspid valve regurgitation is mild. Aortic Valve: The aortic valve is normal in structure. Aortic valve regurgitation is not visualized. Aortic valve mean gradient measures 2.0  mmHg. Aortic valve peak gradient measures 4.2 mmHg. Aortic valve area, by VTI measures 3.29 cm. Pulmonic Valve: Pulmonic valve regurgitation is not visualized. Aorta: The aortic root and ascending aorta are structurally normal, with no evidence of dilitation. Venous: The inferior vena cava is normal in size with greater than 50% respiratory variability, suggesting right atrial pressure of 3 mmHg. IAS/Shunts: No atrial level shunt detected by color flow

## 2022-10-25 NOTE — TOC Benefit Eligibility Note (Signed)
Patient Product/process development scientist completed.    The patient is insured through [employer]/ [employer]. Patient has Medicare and is not eligible for a copay card, but may be able to apply for patient assistance, if available.    Ran test claim for Brilitna 90 mg and the current 30 day co-pay is $47.00.   This test claim was processed through [facility]- copay amounts may vary at other pharmacies due to pharmacy/plan contracts, or as the patient moves through the different stages of their insurance plan.     Roland Earl, CPHT Pharmacy Technician III Certified Patient Advocate [facility] Patient Advocate Team Direct Number: [phone]  Fax: [phone]

## 2022-10-25 NOTE — Progress Notes (Signed)
Progress Note  Patient Name: Scott Koch Date of Encounter: 10/25/2022  Primary Cardiologist:   Norman Herrlich, MD   Subjective   No further chest pain.  No SOB.   Inpatient Medications    Scheduled Meds:  [START ON 10/26/2022] aspirin EC  81 mg Oral Daily   influenza vaccine adjuvanted  0.5 mL Intramuscular Once   insulin aspart  0-9 Units Subcutaneous TID WC   levothyroxine  100 mcg Oral Q0600   losartan  25 mg Oral Daily   montelukast  10 mg Oral QHS   nicotine  21 mg Transdermal Daily   tamsulosin  0.4 mg Oral Weekly   torsemide  20 mg Oral Daily   umeclidinium-vilanterol  1 puff Inhalation Daily   Continuous Infusions:  sodium chloride 1 mL/kg/hr (10/25/22 0709)   heparin 1,450 Units/hr (10/24/22 1908)   PRN Meds: acetaminophen, albuterol, nitroGLYCERIN, ondansetron (ZOFRAN) IV   Vital Signs    Vitals:   10/24/22 1419 10/24/22 1957 10/24/22 2205 10/25/22 0323  BP: 105/65  109/66 121/70  Pulse: 76  66 74  Resp: 18  16 18   Temp: 98.1 F (36.7 C)  98 F (36.7 C) 97.7 F (36.5 C)  TempSrc: Oral  Oral Oral  SpO2: 94%  94% 95%  Weight:  96.6 kg    Height:        Intake/Output Summary (Last 24 hours) at 10/25/2022 0742 Last data filed at 10/25/2022 0327 Gross per 24 hour  Intake 454.01 ml  Output 1350 ml  Net -895.99 ml   Filed Weights   10/23/22 0804 10/24/22 1957  Weight: 96.6 kg 96.6 kg    Telemetry    NSR - Personally Reviewed  ECG    NSR, rate 61, axis WNL, intervals WNL, no acute ST T wave changes.  - Personally Reviewed  Physical Exam   GEN: No acute distress.   Neck: No  JVD Cardiac: RRR, no murmurs, rubs, or gallops.  Respiratory: Clear  to auscultation bilaterally. GI: Soft, nontender, non-distended  MS: No  edema; No deformity. Neuro:  Nonfocal  Psych: Normal affect   Labs    Chemistry Recent Labs  Lab 10/23/22 0843 10/24/22 0359 10/25/22 0515  NA 139 137 137  K 4.7 3.8 4.3  CL 105 102 103  CO2 21* 27 27  GLUCOSE  161* 143* 157*  BUN 12 13 14   CREATININE 0.86 1.01 1.28*  CALCIUM 9.1 9.0 9.0  GFRNONAA >60 >60 >60  ANIONGAP 13 8 7      Hematology Recent Labs  Lab 10/23/22 0843 10/24/22 0359 10/25/22 0515  WBC 9.1 12.5* 9.4  RBC 4.56 4.69 4.67  HGB 13.6 14.0 14.0  HCT 40.1 41.5 41.3  MCV 87.9 88.5 88.4  MCH 29.8 29.9 30.0  MCHC 33.9 33.7 33.9  RDW 13.1 13.1 13.1  PLT 246 244 224    Cardiac EnzymesNo results for input(s): "TROPONINI" in the last 168 hours. No results for input(s): "TROPIPOC" in the last 168 hours.   BNPNo results for input(s): "BNP", "PROBNP" in the last 168 hours.   DDimer No results for input(s): "DDIMER" in the last 168 hours.   Radiology    ECHOCARDIOGRAM COMPLETE  Result Date: 10/23/2022    ECHOCARDIOGRAM REPORT   Patient Name:   Scott Koch Date of Exam: 10/23/2022 Medical Rec #:  [id number]    Height:       72.0 in Accession #:    [id number]   Weight:  213.0 lb Date of Birth:  1955/12/21    BSA:          2.188 m Patient Age:    67 years     BP:           116/71 mmHg Patient Gender: M            HR:           63 bpm. Exam Location:  Inpatient Procedure: 2D Echo, Color Doppler, Cardiac Doppler and Strain Analysis Indications:    NSTEMI I21.4  History:        Patient has prior history of Echocardiogram examinations.                 Signs/Symptoms:Murmur; Risk Factors:Hypertension.  Sonographer:    Neysa Bonito Roar Referring Phys: [id number] [facility] HENDERSON  Sonographer Comments: Global longitudinal strain was attempted. IMPRESSIONS  1. Left ventricular ejection fraction, by estimation, is 55 to 60%. The left ventricle has normal function. The left ventricle has no regional wall motion abnormalities. Left ventricular diastolic parameters were normal. The average left ventricular global longitudinal strain is -19.0 %. The global longitudinal strain is normal.  2. Right ventricular systolic function is normal. The right ventricular size is normal. There is normal pulmonary artery  systolic pressure.  3. The mitral valve is normal in structure. No evidence of mitral valve regurgitation.  4. The aortic valve is normal in structure. Aortic valve regurgitation is not visualized.  5. The inferior vena cava is normal in size with greater than 50% respiratory variability, suggesting right atrial pressure of 3 mmHg. Conclusion(s)/Recommendation(s): Normal biventricular function without evidence of hemodynamically significant valvular heart disease. FINDINGS  Left Ventricle: Left ventricular ejection fraction, by estimation, is 55 to 60%. The left ventricle has normal function. The left ventricle has no regional wall motion abnormalities. The average left ventricular global longitudinal strain is -19.0 %. The global longitudinal strain is normal. The left ventricular internal cavity size was normal in size. There is no left ventricular hypertrophy. Left ventricular diastolic parameters were normal. Right Ventricle: The right ventricular size is normal. Right ventricular systolic function is normal. There is normal pulmonary artery systolic pressure. The tricuspid regurgitant velocity is 2.35 m/s, and with an assumed right atrial pressure of 3 mmHg,  the estimated right ventricular systolic pressure is 25.1 mmHg. Left Atrium: Left atrial size was normal in size. Right Atrium: Right atrial size was normal in size. Pericardium: There is no evidence of pericardial effusion. Mitral Valve: The mitral valve is normal in structure. No evidence of mitral valve regurgitation. MV peak gradient, 2.9 mmHg. The mean mitral valve gradient is 2.0 mmHg. Tricuspid Valve: Tricuspid valve regurgitation is mild. Aortic Valve: The aortic valve is normal in structure. Aortic valve regurgitation is not visualized. Aortic valve mean gradient measures 2.0 mmHg. Aortic valve peak gradient measures 4.2 mmHg. Aortic valve area, by VTI measures 3.29 cm. Pulmonic Valve: Pulmonic valve regurgitation is not visualized. Aorta: The  aortic root and ascending aorta are structurally normal, with no evidence of dilitation. Venous: The inferior vena cava is normal in size with greater than 50% respiratory variability, suggesting right atrial pressure of 3 mmHg. IAS/Shunts: No atrial level shunt detected by color flow Doppler.  LEFT VENTRICLE PLAX 2D LVIDd:         4.40 cm   Diastology LVIDs:         3.10 cm   LV e' medial:    9.57 cm/s LV PW:  0.90 cm   LV E/e' medial:  9.5 LV IVS:        1.00 cm   LV e' lateral:   13.90 cm/s LVOT diam:     2.10 cm   LV E/e' lateral: 6.5 LV SV:         61 LV SV Index:   28        2D Longitudinal Strain LVOT Area:     3.46 cm  2D Strain GLS Avg:     -19.0 %  RIGHT VENTRICLE RV Basal diam:  3.30 cm RV Mid diam:    2.80 cm RV S prime:     15.10 cm/s TAPSE (M-mode): 2.2 cm LEFT ATRIUM             Index        RIGHT ATRIUM           Index LA diam:        3.40 cm 1.55 cm/m   RA Area:     14.70 cm LA Vol (A2C):   45.7 ml 20.88 ml/m  RA Volume:   34.60 ml  15.81 ml/m LA Vol (A4C):   31.5 ml 14.40 ml/m LA Biplane Vol: 39.6 ml 18.10 ml/m  AORTIC VALVE                    PULMONIC VALVE AV Area (Vmax):    3.09 cm     PV Vmax:          1.01 m/s AV Area (Vmean):   3.02 cm     PV Peak grad:     4.1 mmHg AV Area (VTI):     3.29 cm     PR End Diast Vel: 3.76 msec AV Vmax:           102.00 cm/s  RVOT Peak grad:   2 mmHg AV Vmean:          69.600 cm/s AV VTI:            0.184 m AV Peak Grad:      4.2 mmHg AV Mean Grad:      2.0 mmHg LVOT Vmax:         91.00 cm/s LVOT Vmean:        60.700 cm/s LVOT VTI:          0.175 m LVOT/AV VTI ratio: 0.95  AORTA Ao Sinus diam: 2.90 cm Ao STJ diam:   2.7 cm Ao Asc diam:   2.90 cm MITRAL VALVE               TRICUSPID VALVE MV Area (PHT): 4.36 cm    TR Peak grad:   22.1 mmHg MV Area VTI:   2.55 cm    TR Vmax:        235.00 cm/s MV Peak grad:  2.9 mmHg MV Mean grad:  2.0 mmHg    SHUNTS MV Vmax:       0.85 m/s    Systemic VTI:  0.18 m MV Vmean:      60.2 cm/s   Systemic Diam: 2.10  cm MV Decel Time: 174 msec MV E velocity: 90.70 cm/s MV A velocity: 93.30 cm/s MV E/A ratio:  0.97 Scott Koch signed by Scott Koch Signature Date/Time: 10/23/2022/1:54:42 PM    Final     Cardiac Studies   Echo  10/23/22  1. Left ventricular ejection fraction, by estimation, is 55 to 60%. The  left ventricle has normal function.  The left ventricle has no regional  wall motion abnormalities. Left ventricular diastolic parameters were  normal. The average left ventricular  global longitudinal strain is -19.0 %. The global longitudinal strain is  normal.   2. Right ventricular systolic function is normal. The right ventricular  size is normal. There is normal pulmonary artery systolic pressure.   3. The mitral valve is normal in structure. No evidence of mitral valve  regurgitation.   4. The aortic valve is normal in structure. Aortic valve regurgitation is  not visualized.   5. The inferior vena cava is normal in size with greater than 50%  respiratory variability, suggesting right atrial pressure of 3 mmHg.   Patient Profile     67 y.o. male with a significant past medical history of coronary artery disease (reports stent placed prior but unknown anatomy), hypertension, probably heart failure with preserved ejection fraction, COPD, insulin-dependent diabetes, and cigarette use who is being seen 10/23/2022 for the evaluation of chest pain.   Assessment & Plan    NSTEMI:  Cath today.      HTN:  BP is currently well controlled on meds as on MAR.   Dyslipidemia:  LDL is 67.   LPa in process. Resume Crestor not ordered on admission and increase to moderate dose.       Chronic diastolic HF:  Euvolemic.  Hold diuretic in anticipation of cath and with increased creat  DM:  Continue SSI.    Tobacco educated:  Educated this admission.   Elevated creat:  Up slightly, Limit dye and follow up post cath.  Hold Losartan and Torsemide today.      For questions or updates, please  contact [facility] Please consult [email or web address] for contact info under Cardiology/STEMI.   Signed, Rollene Rotunda, MD  10/25/2022, 7:42 AM

## 2022-10-25 NOTE — Interval H&P Note (Signed)
History and Physical Interval Note:  10/25/2022 9:45 AM  Scott Koch  has presented today for surgery, with the diagnosis of NSTEMI.  The various methods of treatment have been discussed with the patient and family. After consideration of risks, benefits and other options for treatment, the patient has consented to  Procedure(s): LEFT HEART CATH AND CORONARY ANGIOGRAPHY (N/A)  PERCUTANEOUS CORONARY INTERVENTION  as a surgical intervention.  The patient's history has been reviewed, patient examined, no change in status, stable for surgery.  I have reviewed the patient's chart and labs.  Questions were answered to the patient's satisfaction.    Cath Lab Visit (complete for each Cath Lab visit)  Clinical Evaluation Leading to the Procedure:   ACS: Yes.    Non-ACS:    Anginal Classification: CCS IV  Anti-ischemic medical therapy: Minimal Therapy (1 class of medications)  Non-Invasive Test Results: No non-invasive testing performed  Prior CABG: No previous CABG     Bryan Lemma
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
## 2022-10-25 NOTE — H&P (View-Only) (Signed)
Progress Note  Patient Name: Scott Koch Date of Encounter: 10/25/2022  Primary Cardiologist:   Norman Herrlich, MD   Subjective   No further chest pain.  No SOB.   Inpatient Medications    Scheduled Meds:  [START ON 10/26/2022] aspirin EC  81 mg Oral Daily   influenza vaccine adjuvanted  0.5 mL Intramuscular Once   insulin aspart  0-9 Units Subcutaneous TID WC   levothyroxine  100 mcg Oral Q0600   losartan  25 mg Oral Daily   montelukast  10 mg Oral QHS   nicotine  21 mg Transdermal Daily   tamsulosin  0.4 mg Oral Weekly   torsemide  20 mg Oral Daily   umeclidinium-vilanterol  1 puff Inhalation Daily   Continuous Infusions:  sodium chloride 1 mL/kg/hr (10/25/22 0709)   heparin 1,450 Units/hr (10/24/22 1908)   PRN Meds: acetaminophen, albuterol, nitroGLYCERIN, ondansetron (ZOFRAN) IV   Vital Signs    Vitals:   10/24/22 1419 10/24/22 1957 10/24/22 2205 10/25/22 0323  BP: 105/65  109/66 121/70  Pulse: 76  66 74  Resp: 18  16 18   Temp: 98.1 F (36.7 C)  98 F (36.7 C) 97.7 F (36.5 C)  TempSrc: Oral  Oral Oral  SpO2: 94%  94% 95%  Weight:  96.6 kg    Height:        Intake/Output Summary (Last 24 hours) at 10/25/2022 0742 Last data filed at 10/25/2022 0327 Gross per 24 hour  Intake 454.01 ml  Output 1350 ml  Net -895.99 ml   Filed Weights   10/23/22 0804 10/24/22 1957  Weight: 96.6 kg 96.6 kg    Telemetry    NSR - Personally Reviewed  ECG    NSR, rate 61, axis WNL, intervals WNL, no acute ST T wave changes.  - Personally Reviewed  Physical Exam   GEN: No acute distress.   Neck: No  JVD Cardiac: RRR, no murmurs, rubs, or gallops.  Respiratory: Clear  to auscultation bilaterally. GI: Soft, nontender, non-distended  MS: No  edema; No deformity. Neuro:  Nonfocal  Psych: Normal affect   Labs    Chemistry Recent Labs  Lab 10/23/22 0843 10/24/22 0359 10/25/22 0515  NA 139 137 137  K 4.7 3.8 4.3  CL 105 102 103  CO2 21* 27 27  GLUCOSE  161* 143* 157*  BUN 12 13 14   CREATININE 0.86 1.01 1.28*  CALCIUM 9.1 9.0 9.0  GFRNONAA >60 >60 >60  ANIONGAP 13 8 7      Hematology Recent Labs  Lab 10/23/22 0843 10/24/22 0359 10/25/22 0515  WBC 9.1 12.5* 9.4  RBC 4.56 4.69 4.67  HGB 13.6 14.0 14.0  HCT 40.1 41.5 41.3  MCV 87.9 88.5 88.4  MCH 29.8 29.9 30.0  MCHC 33.9 33.7 33.9  RDW 13.1 13.1 13.1  PLT 246 244 224    Cardiac EnzymesNo results for input(s): "TROPONINI" in the last 168 hours. No results for input(s): "TROPIPOC" in the last 168 hours.   BNPNo results for input(s): "BNP", "PROBNP" in the last 168 hours.   DDimer No results for input(s): "DDIMER" in the last 168 hours.   Radiology    ECHOCARDIOGRAM COMPLETE  Result Date: 10/23/2022    ECHOCARDIOGRAM REPORT   Patient Name:   Scott Koch Date of Exam: 10/23/2022 Medical Rec #:  [id number]    Height:       72.0 in Accession #:    [id number]   Weight:  213.0 lb Date of Birth:  1955/12/21    BSA:          2.188 m Patient Age:    67 years     BP:           116/71 mmHg Patient Gender: M            HR:           63 bpm. Exam Location:  Inpatient Procedure: 2D Echo, Color Doppler, Cardiac Doppler and Strain Analysis Indications:    NSTEMI I21.4  History:        Patient has prior history of Echocardiogram examinations.                 Signs/Symptoms:Murmur; Risk Factors:Hypertension.  Sonographer:    Neysa Bonito Roar Referring Phys: [id number] [facility] HENDERSON  Sonographer Comments: Global longitudinal strain was attempted. IMPRESSIONS  1. Left ventricular ejection fraction, by estimation, is 55 to 60%. The left ventricle has normal function. The left ventricle has no regional wall motion abnormalities. Left ventricular diastolic parameters were normal. The average left ventricular global longitudinal strain is -19.0 %. The global longitudinal strain is normal.  2. Right ventricular systolic function is normal. The right ventricular size is normal. There is normal pulmonary artery  systolic pressure.  3. The mitral valve is normal in structure. No evidence of mitral valve regurgitation.  4. The aortic valve is normal in structure. Aortic valve regurgitation is not visualized.  5. The inferior vena cava is normal in size with greater than 50% respiratory variability, suggesting right atrial pressure of 3 mmHg. Conclusion(s)/Recommendation(s): Normal biventricular function without evidence of hemodynamically significant valvular heart disease. FINDINGS  Left Ventricle: Left ventricular ejection fraction, by estimation, is 55 to 60%. The left ventricle has normal function. The left ventricle has no regional wall motion abnormalities. The average left ventricular global longitudinal strain is -19.0 %. The global longitudinal strain is normal. The left ventricular internal cavity size was normal in size. There is no left ventricular hypertrophy. Left ventricular diastolic parameters were normal. Right Ventricle: The right ventricular size is normal. Right ventricular systolic function is normal. There is normal pulmonary artery systolic pressure. The tricuspid regurgitant velocity is 2.35 m/s, and with an assumed right atrial pressure of 3 mmHg,  the estimated right ventricular systolic pressure is 25.1 mmHg. Left Atrium: Left atrial size was normal in size. Right Atrium: Right atrial size was normal in size. Pericardium: There is no evidence of pericardial effusion. Mitral Valve: The mitral valve is normal in structure. No evidence of mitral valve regurgitation. MV peak gradient, 2.9 mmHg. The mean mitral valve gradient is 2.0 mmHg. Tricuspid Valve: Tricuspid valve regurgitation is mild. Aortic Valve: The aortic valve is normal in structure. Aortic valve regurgitation is not visualized. Aortic valve mean gradient measures 2.0 mmHg. Aortic valve peak gradient measures 4.2 mmHg. Aortic valve area, by VTI measures 3.29 cm. Pulmonic Valve: Pulmonic valve regurgitation is not visualized. Aorta: The  aortic root and ascending aorta are structurally normal, with no evidence of dilitation. Venous: The inferior vena cava is normal in size with greater than 50% respiratory variability, suggesting right atrial pressure of 3 mmHg. IAS/Shunts: No atrial level shunt detected by color flow Doppler.  LEFT VENTRICLE PLAX 2D LVIDd:         4.40 cm   Diastology LVIDs:         3.10 cm   LV e' medial:    9.57 cm/s LV PW:  0.90 cm   LV E/e' medial:  9.5 LV IVS:        1.00 cm   LV e' lateral:   13.90 cm/s LVOT diam:     2.10 cm   LV E/e' lateral: 6.5 LV SV:         61 LV SV Index:   28        2D Longitudinal Strain LVOT Area:     3.46 cm  2D Strain GLS Avg:     -19.0 %  RIGHT VENTRICLE RV Basal diam:  3.30 cm RV Mid diam:    2.80 cm RV S prime:     15.10 cm/s TAPSE (M-mode): 2.2 cm LEFT ATRIUM             Index        RIGHT ATRIUM           Index LA diam:        3.40 cm 1.55 cm/m   RA Area:     14.70 cm LA Vol (A2C):   45.7 ml 20.88 ml/m  RA Volume:   34.60 ml  15.81 ml/m LA Vol (A4C):   31.5 ml 14.40 ml/m LA Biplane Vol: 39.6 ml 18.10 ml/m  AORTIC VALVE                    PULMONIC VALVE AV Area (Vmax):    3.09 cm     PV Vmax:          1.01 m/s AV Area (Vmean):   3.02 cm     PV Peak grad:     4.1 mmHg AV Area (VTI):     3.29 cm     PR End Diast Vel: 3.76 msec AV Vmax:           102.00 cm/s  RVOT Peak grad:   2 mmHg AV Vmean:          69.600 cm/s AV VTI:            0.184 m AV Peak Grad:      4.2 mmHg AV Mean Grad:      2.0 mmHg LVOT Vmax:         91.00 cm/s LVOT Vmean:        60.700 cm/s LVOT VTI:          0.175 m LVOT/AV VTI ratio: 0.95  AORTA Ao Sinus diam: 2.90 cm Ao STJ diam:   2.7 cm Ao Asc diam:   2.90 cm MITRAL VALVE               TRICUSPID VALVE MV Area (PHT): 4.36 cm    TR Peak grad:   22.1 mmHg MV Area VTI:   2.55 cm    TR Vmax:        235.00 cm/s MV Peak grad:  2.9 mmHg MV Mean grad:  2.0 mmHg    SHUNTS MV Vmax:       0.85 m/s    Systemic VTI:  0.18 m MV Vmean:      60.2 cm/s   Systemic Diam: 2.10  cm MV Decel Time: 174 msec MV E velocity: 90.70 cm/s MV A velocity: 93.30 cm/s MV E/A ratio:  0.97 [place] signed by Carolan Clines Signature Date/Time: 10/23/2022/1:54:42 PM    Final     Cardiac Studies   Echo  10/23/22  1. Left ventricular ejection fraction, by estimation, is 55 to 60%. The  left ventricle has normal function.  The left ventricle has no regional  wall motion abnormalities. Left ventricular diastolic parameters were  normal. The average left ventricular  global longitudinal strain is -19.0 %. The global longitudinal strain is  normal.   2. Right ventricular systolic function is normal. The right ventricular  size is normal. There is normal pulmonary artery systolic pressure.   3. The mitral valve is normal in structure. No evidence of mitral valve  regurgitation.   4. The aortic valve is normal in structure. Aortic valve regurgitation is  not visualized.   5. The inferior vena cava is normal in size with greater than 50%  respiratory variability, suggesting right atrial pressure of 3 mmHg.   Patient Profile     67 y.o. male with a significant past medical history of coronary artery disease (reports stent placed prior but unknown anatomy), hypertension, probably heart failure with preserved ejection fraction, COPD, insulin-dependent diabetes, and cigarette use who is being seen 10/23/2022 for the evaluation of chest pain.   Assessment & Plan    NSTEMI:  Cath today.      HTN:  BP is currently well controlled on meds as on MAR.   Dyslipidemia:  LDL is 67.   LPa in process. Resume Crestor not ordered on admission and increase to moderate dose.       Chronic diastolic HF:  Euvolemic.  Hold diuretic in anticipation of cath and with increased creat  DM:  Continue SSI.    Tobacco educated:  Educated this admission.   Elevated creat:  Up slightly, Limit dye and follow up post cath.  Hold Losartan and Torsemide today.      For questions or updates, please  contact [facility] Please consult [email or web address] for contact info under Cardiology/STEMI.   Signed, Rollene Rotunda, MD  10/25/2022, 7:42 AM

## 2022-10-25 NOTE — Progress Notes (Signed)
ANTICOAGULATION CONSULT NOTE  Pharmacy Consult for heparin Indication:  chest pain/ACS  Allergies  Allergen Reactions   Hazelnut ([place]) Anaphylaxis   Norel Ad [Chlorphen-Pe-Acetaminophen] Anaphylaxis   Penicillins Anaphylaxis   Sulfa Antibiotics Hives   Neosporin [Neomycin-Bacitracin Zn-Polymyx] Rash and Other (See Comments)    Redness around application site    Patient Measurements: Height: 6' (182.9 cm) Weight: 96.6 kg (212 lb 15.4 oz) IBW/kg (Calculated) : 77.6 Heparin Dosing Weight: 96.6 kg  Vital Signs: Temp: 97.7 F (36.5 C) (09/30 0323) Temp Source: Oral (09/30 0323) BP: 121/70 (09/30 0323) Pulse Rate: 74 (09/30 0323)  Labs: Recent Labs    10/23/22 0843 10/23/22 1237 10/23/22 1347 10/24/22 0359 10/25/22 0515  HGB 13.6  --   --  14.0 14.0  HCT 40.1  --   --  41.5 41.3  PLT 246  --   --  244 224  HEPARINUNFRC  --  0.25*  --  0.30 0.36  CREATININE 0.86  --   --  1.01 1.28*  TROPONINIHS  --  202* 243*  --   --     Estimated Creatinine Clearance: 66.6 mL/min (A) (by C-G formula based on SCr of 1.28 mg/dL (H)).   Medical History: Past Medical History:  Diagnosis Date   Asthma    inhaler used only when seasonal allergies    Complication of anesthesia    patient states"gets rowdy" when wake up   COPD (chronic obstructive pulmonary disease) (HCC)    Diabetes mellitus without complication (HCC)    fasting blood sugar avg 120   Heart murmur    Hypertension    Hypothyroidism    Pneumonia    hx   Protein in urine    Shortness of breath dyspnea    hx    Medications:   Scheduled:   [START ON 10/26/2022] aspirin EC  81 mg Oral Daily   influenza vaccine adjuvanted  0.5 mL Intramuscular Once   insulin aspart  0-9 Units Subcutaneous TID WC   levothyroxine  100 mcg Oral Q0600   losartan  25 mg Oral Daily   montelukast  10 mg Oral QHS   nicotine  21 mg Transdermal Daily   tamsulosin  0.4 mg Oral Weekly   torsemide  20 mg Oral Daily    umeclidinium-vilanterol  1 puff Inhalation Daily    Assessment: Scott Koch presenting with chest pain. Patient transferred from [facility]. Patient was given high dose aspirin and started on Lovenox prior to transfer. Pharmacy consulted for IV heparin. Plan for coronary angiography on Monday.   9/29 AM: Heparin level 0.3, borderline subtherapeutic on 1400 units/hr. No signs of bleeding noted or issues with infusion running per RN. CBC stable (Hgb 14.0, Plt 224).   Goal of Therapy:  Heparin level 0.3-0.7 units/ml Monitor platelets by anticoagulation protocol: Yes   Plan:  -Continue IV heparin at 1450 units/hr -Heparin level and CBC daily -Monitor for signs and symptoms of bleeding daily -F/u plans for heparin after cath today.  Reece Leader, Colon Flattery, BCCP Clinical Pharmacist  10/25/2022 8:01 AM   [facility] pharmacy phone numbers are listed on [email or web address]

## 2022-10-26 ENCOUNTER — Other Ambulatory Visit (HOSPITAL_COMMUNITY): Payer: Self-pay

## 2022-10-26 ENCOUNTER — Encounter (HOSPITAL_COMMUNITY): Payer: Self-pay | Admitting: Cardiology

## 2022-10-26 DIAGNOSIS — I219 Acute myocardial infarction, unspecified: Secondary | ICD-10-CM | POA: Diagnosis not present

## 2022-10-26 DIAGNOSIS — D692 Other nonthrombocytopenic purpura: Secondary | ICD-10-CM | POA: Diagnosis not present

## 2022-10-26 DIAGNOSIS — I214 Non-ST elevation (NSTEMI) myocardial infarction: Secondary | ICD-10-CM | POA: Diagnosis not present

## 2022-10-26 LAB — CBC
HCT: 43.4 % (ref 39.0–52.0)
Hemoglobin: 14.3 g/dL (ref 13.0–17.0)
MCH: 29.4 pg (ref 26.0–34.0)
MCHC: 32.9 g/dL (ref 30.0–36.0)
MCV: 89.1 fL (ref 80.0–100.0)
Platelets: 243 10*3/uL (ref 150–400)
RBC: 4.87 MIL/uL (ref 4.22–5.81)
RDW: 13.2 % (ref 11.5–15.5)
WBC: 10.8 10*3/uL — ABNORMAL HIGH (ref 4.0–10.5)
nRBC: 0 % (ref 0.0–0.2)

## 2022-10-26 LAB — BASIC METABOLIC PANEL
Anion gap: 8 (ref 5–15)
BUN: 10 mg/dL (ref 8–23)
CO2: 25 mmol/L (ref 22–32)
Calcium: 9.1 mg/dL (ref 8.9–10.3)
Chloride: 107 mmol/L (ref 98–111)
Creatinine, Ser: 0.9 mg/dL (ref 0.61–1.24)
GFR, Estimated: >60 mL/min (ref >60)
Glucose, Bld: 142 mg/dL — ABNORMAL HIGH (ref 70–99)
Potassium: 4.6 mmol/L (ref 3.5–5.1)
Sodium: 140 mmol/L (ref 135–145)

## 2022-10-26 LAB — GLUCOSE, CAPILLARY
Glucose-Capillary: 129 mg/dL — ABNORMAL HIGH (ref 70–99)
Glucose-Capillary: 164 mg/dL — ABNORMAL HIGH (ref 70–99)

## 2022-10-26 MED ORDER — TICAGRELOR 90 MG PO TABS
90.0000 mg | ORAL_TABLET | Freq: Two times a day (BID) | ORAL | 11 refills | Status: DC
Start: 2022-10-26 — End: 2023-08-05
  Filled 2022-10-26: qty 60, 30d supply, fill #0

## 2022-10-26 MED ORDER — NITROGLYCERIN 0.4 MG SL SUBL
0.4000 mg | SUBLINGUAL_TABLET | SUBLINGUAL | 1 refills | Status: DC | PRN
  Filled 2022-10-26: qty 25, 5d supply, fill #0

## 2022-10-26 MED ORDER — ROSUVASTATIN CALCIUM 20 MG PO TABS
20.0000 mg | ORAL_TABLET | Freq: Every day | ORAL | 2 refills | Status: DC
  Filled 2022-10-26: qty 90, 90d supply, fill #0

## 2022-10-26 NOTE — Plan of Care (Signed)
  Problem: Education: Goal: Knowledge of General Education information will improve Description: Including pain rating scale, medication(s)/side effects and non-pharmacologic comfort measures Outcome: Completed/Met   Problem: Health Behavior/Discharge Planning: Goal: Ability to manage health-related needs will improve Outcome: Completed/Met   Problem: Clinical Measurements: Goal: Ability to maintain clinical measurements within normal limits will improve Outcome: Completed/Met Goal: Will remain free from infection Outcome: Completed/Met Goal: Diagnostic test results will improve Outcome: Completed/Met Goal: Respiratory complications will improve Outcome: Completed/Met Goal: Cardiovascular complication will be avoided Outcome: Completed/Met   Problem: Activity: Goal: Risk for activity intolerance will decrease Outcome: Completed/Met   Problem: Nutrition: Goal: Adequate nutrition will be maintained Outcome: Completed/Met   Problem: Coping: Goal: Level of anxiety will decrease Outcome: Completed/Met   Problem: Elimination: Goal: Will not experience complications related to bowel motility Outcome: Completed/Met Goal: Will not experience complications related to urinary retention Outcome: Completed/Met   Problem: Pain Managment: Goal: General experience of comfort will improve Outcome: Completed/Met   Problem: Safety: Goal: Ability to remain free from injury will improve Outcome: Completed/Met   Problem: Skin Integrity: Goal: Risk for impaired skin integrity will decrease Outcome: Completed/Met   Problem: Education: Goal: Ability to describe self-care measures that may prevent or decrease complications (Diabetes Survival Skills Education) will improve Outcome: Completed/Met Goal: Individualized Educational Video(s) Outcome: Completed/Met   Problem: Coping: Goal: Ability to adjust to condition or change in health will improve Outcome: Completed/Met   Problem:  Fluid Volume: Goal: Ability to maintain a balanced intake and output will improve Outcome: Completed/Met   Problem: Health Behavior/Discharge Planning: Goal: Ability to identify and utilize available resources and services will improve Outcome: Completed/Met Goal: Ability to manage health-related needs will improve Outcome: Completed/Met   Problem: Metabolic: Goal: Ability to maintain appropriate glucose levels will improve Outcome: Completed/Met   Problem: Nutritional: Goal: Maintenance of adequate nutrition will improve Outcome: Completed/Met Goal: Progress toward achieving an optimal weight will improve Outcome: Completed/Met   Problem: Skin Integrity: Goal: Risk for impaired skin integrity will decrease Outcome: Completed/Met   Problem: Tissue Perfusion: Goal: Adequacy of tissue perfusion will improve Outcome: Completed/Met   Problem: Education: Goal: Understanding of cardiac disease, CV risk reduction, and recovery process will improve Outcome: Completed/Met Goal: Individualized Educational Video(s) Outcome: Completed/Met   Problem: Activity: Goal: Ability to tolerate increased activity will improve Outcome: Completed/Met   Problem: Cardiac: Goal: Ability to achieve and maintain adequate cardiovascular perfusion will improve Outcome: Completed/Met   Problem: Health Behavior/Discharge Planning: Goal: Ability to safely manage health-related needs after discharge will improve Outcome: Completed/Met   Problem: Education: Goal: Understanding of CV disease, CV risk reduction, and recovery process will improve Outcome: Completed/Met Goal: Individualized Educational Video(s) Outcome: Completed/Met   Problem: Activity: Goal: Ability to return to baseline activity level will improve Outcome: Completed/Met   Problem: Cardiovascular: Goal: Ability to achieve and maintain adequate cardiovascular perfusion will improve Outcome: Completed/Met Goal: Vascular access  site(s) Level 0-1 will be maintained Outcome: Completed/Met   Problem: Health Behavior/Discharge Planning: Goal: Ability to safely manage health-related needs after discharge will improve Outcome: Completed/Met

## 2022-10-26 NOTE — Progress Notes (Signed)
Pt in bed receiving meds, denies questions from yesterday's education. Reports walking the hallway twice this morning without chest pain. Referral in system for CR in [place]. Will s/o for now.   Faustino Congress 10/26/2022 8:40 AM

## 2022-10-26 NOTE — Progress Notes (Signed)
Progress Note  Patient Name: Scott Koch Date of Encounter: 10/26/2022  Primary Cardiologist:   Norman Herrlich, MD   Subjective   No chest pain.  No SOB.   Inpatient Medications    Scheduled Meds:  aspirin EC  81 mg Oral Daily   insulin aspart  0-9 Units Subcutaneous TID WC   levothyroxine  100 mcg Oral Q0600   montelukast  10 mg Oral QHS   nicotine  21 mg Transdermal Daily   rosuvastatin  20 mg Oral Daily   sodium chloride flush  3 mL Intravenous Q12H   tamsulosin  0.4 mg Oral Weekly   ticagrelor  90 mg Oral BID   umeclidinium-vilanterol  1 puff Inhalation Daily   Continuous Infusions:  sodium chloride     PRN Meds: sodium chloride, acetaminophen, albuterol, morphine injection, nitroGLYCERIN, ondansetron (ZOFRAN) IV, sodium chloride flush   Vital Signs    Vitals:   10/25/22 1144 10/25/22 1218 10/25/22 2021 10/26/22 0543  BP: 122/76 135/70 121/77 121/73  Pulse: 64 (!) 58 77 68  Resp:  16 16 18   Temp:  (!) 97.4 F (36.3 C) 97.9 F (36.6 C) 98 F (36.7 C)  TempSrc:  Axillary Oral Oral  SpO2: 96% 96% 96% 93%  Weight:      Height:        Intake/Output Summary (Last 24 hours) at 10/26/2022 0725 Last data filed at 10/25/2022 1814 Gross per 24 hour  Intake 596.7 ml  Output --  Net 596.7 ml   Filed Weights   10/23/22 0804 10/24/22 1957  Weight: 96.6 kg 96.6 kg    Telemetry    NSR - Personally Reviewed  ECG    NA- Personally Reviewed  Physical Exam   GEN: No  acute distress.   Neck: No  JVD Cardiac: RRR, no murmurs, rubs, or gallops.  Respiratory: Clear   to auscultation bilaterally. GI: Soft, nontender, non-distended, normal bowel sounds  MS:  No edema; No deformity.  Right radial artery without bleeding or bruising Neuro:   Nonfocal  Psych: Oriented and appropriate    Labs    Chemistry Recent Labs  Lab 10/24/22 0359 10/25/22 0515 10/26/22 0509  NA 137 137 140  K 3.8 4.3 4.6  CL 102 103 107  CO2 27 27 25   GLUCOSE 143* 157* 142*   BUN 13 14 10   CREATININE 1.01 1.28* 0.90  CALCIUM 9.0 9.0 9.1  GFRNONAA >60 >60 >60  ANIONGAP 8 7 8      Hematology Recent Labs  Lab 10/24/22 0359 10/25/22 0515 10/26/22 0509  WBC 12.5* 9.4 10.8*  RBC 4.69 4.67 4.87  HGB 14.0 14.0 14.3  HCT 41.5 41.3 43.4  MCV 88.5 88.4 89.1  MCH 29.9 30.0 29.4  MCHC 33.7 33.9 32.9  RDW 13.1 13.1 13.2  PLT 244 224 243    Cardiac EnzymesNo results for input(s): "TROPONINI" in the last 168 hours. No results for input(s): "TROPIPOC" in the last 168 hours.   BNPNo results for input(s): "BNP", "PROBNP" in the last 168 hours.   DDimer No results for input(s): "DDIMER" in the last 168 hours.   Radiology    CARDIAC CATHETERIZATION  Result Date: 10/25/2022   Culprit Lesion Segment: prox RCA-1 lesion is 60% stenosed. Mid RCA lesion is 85% stenosed.  Prox RCA-2 lesion is 80% stenosed.   Following Balloon PTCA and Shockwave Lithotripsy, a drug-eluting stent was successfully placed covering all 3 lesions of culprit segment, using a SYNERGY XD 3.0X38 => postdilated  to 3.6 mm   Post intervention, there is a 0% residual stenosis in the distal 2 lesions with 10% residual stenosis in the heavily calcified eccentric 60% segment   -------------------------------------------------------------------------   Mid LM to Prox LAD lesion is 25% stenosed.   -------------------------------------------------------------------------   The left ventricular systolic function is normal, by echocardiogram; LV end diastolic pressure is normal.   ========================================== POST-CATH FINDINGS Severe single-vessel CAD with heavily calcified eccentric proximal RCA 60% tapering to a 80% shelflike lesion followed by a napkin ring 85% stenosis Successful Shockwave Lithotripsy based DES PCI of the RCA (Synergy XD 3.0 mm x 38 mm postdilated to 3.6 mm), reducing stenoses to a few focal areas of 10% but otherwise 0% with TIMI-3 flow restored Otherwise moderate calcification in  the distal LM-proximal LAD and LCx but at most 30% proximal LAD stenosis. Normal LVEDP (preserved EF by echo) RECOMMENDATIONS   Continue titrate aggressive GDMT for CAD   Recommend uninterrupted dual antiplatelet therapy with Aspirin 81mg  daily and Ticagrelor 90mg  twice daily for a minimum of 12 months (ACS-Class I recommendation).   After 1 year, okay to DC ASA and continue with SAPT either with maintenance dose ticagrelor 60 mg twice daily or converting to complete a grill 75 mg daily.   In the absence of any other complications or medical issues, we expect the patient to be ready for discharge from an interventional cardiology         perspective on 10/25/2022. Bryan Lemma, MD   Cardiac Studies   Echo  10/23/22  1. Left ventricular ejection fraction, by estimation, is 55 to 60%. The  left ventricle has normal function. The left ventricle has no regional  wall motion abnormalities. Left ventricular diastolic parameters were  normal. The average left ventricular  global longitudinal strain is -19.0 %. The global longitudinal strain is  normal.   2. Right ventricular systolic function is normal. The right ventricular  size is normal. There is normal pulmonary artery systolic pressure.   3. The mitral valve is normal in structure. No evidence of mitral valve  regurgitation.   4. The aortic valve is normal in structure. Aortic valve regurgitation is  not visualized.   5. The inferior vena cava is normal in size with greater than 50%  respiratory variability, suggesting right atrial pressure of 3 mmHg.   Patient Profile     67 y.o. male with a significant past medical history of coronary artery disease (reports stent placed prior but unknown anatomy), hypertension, probably heart failure with preserved ejection fraction, COPD, insulin-dependent diabetes, and cigarette use who is being seen 10/23/2022 for the evaluation of chest pain.   Assessment & Plan    NSTEMI: PCI of RCA as above.   HTN:   BP is currently well controlled on current meds.   Resume Cozaar at discharge.    Dyslipidemia:  LDL is 67.  Increased Crestor this admission. LPa is very elevated at 289.3.  Discussed with patient.  LPa is elevated.  There are no specific therapies at this time.  Plan is optimal management of his cholesterol as planned.  No change in therapy.  We will keep the patient in mind as future therapies are developed.        Chronic diastolic HF:  Euvolemic.  Can give Torsemide daily at discharge.    DM:  Continue SSI.    Tobacco educated:  Educated this admission.   Elevated creat:  Down.   Follow as an outpatient  Has follow  up with APP in [place] on 10/8  For questions or updates, please contact [facility] Please consult [email or web address] for contact info under Cardiology/STEMI.   Signed, Rollene Rotunda, MD  10/26/2022, 7:25 AM

## 2022-10-28 ENCOUNTER — Telehealth (HOSPITAL_COMMUNITY): Payer: Self-pay

## 2022-10-28 NOTE — Telephone Encounter (Signed)
Per Phase 1 Cardiac Rehab fax referral to . 

## 2022-11-01 ENCOUNTER — Other Ambulatory Visit (HOSPITAL_COMMUNITY): Payer: Self-pay

## 2022-11-01 NOTE — Progress Notes (Unsigned)
Cardiology Office Note:  .   Date:  11/02/2022  ID:  Scott Koch, DOB 05-02-1955, MRN [id number] PCP: Scott Like, NP  [facility] Providers Cardiologist:  Norman Herrlich, MD    History of Present Illness: .   Scott Koch is a 67 y.o. male with a past medical history of hypertension, CAD s/p DES to RCA 2024, diastolic heart failure, COPD, DM 2, hypothyroidism, tobacco use.  10/23/2022 echo EF 55 to 60%, no valvular abnormalities 10/25/2022 left heart cath severe single-vessel CAD s/p PCI of RCA with DES x 1  Most recently admitted to the hospital 10/22/2022 for non-STEMI, underwent left heart cath revealing single-vessel severe CAD s/p shockwave lithotripsy and DES PCI of RCA.  Recommended DAPT with aspirin and Brilinta for 12 months, after that DC aspirin and continue Brilinta or Plavix.  His Crestor was increased as his LPA was elevated at 289.  He presents today for follow-up after his recent hospitalization as outlined above.  He is doing remarkably well, offers no formal complaints today.  He stopped his Imdur because it was causing him headaches--this was started prior to his left heart cath and advised he does not need to take it if he is not bothered by angina.  He is started walking 7 minutes twice daily.  He has heard from cardiac rehab and plans to start that next week.  He is trying to stop smoking, with the help of nicotine patch.  He is very dedicated to taking better control of his heart health. He denies chest pain, palpitations, dyspnea, pnd, orthopnea, n, v, dizziness, syncope, edema, weight gain, or early satiety.   ROS: Review of Systems  All other systems reviewed and are negative.    Studies Reviewed: .        Cardiac Studies & Procedures   CARDIAC CATHETERIZATION  CARDIAC CATHETERIZATION 10/25/2022  Narrative   Culprit Lesion Segment: prox RCA-1 lesion is 60% stenosed. Mid RCA lesion is 85% stenosed.  Prox RCA-2 lesion is 80% stenosed.   Following Balloon  PTCA and Shockwave Lithotripsy, a drug-eluting stent was successfully placed covering all 3 lesions of culprit segment, using a SYNERGY XD 3.0X38 => postdilated to 3.6 mm   Post intervention, there is a 0% residual stenosis in the distal 2 lesions with 10% residual stenosis in the heavily calcified eccentric 60% segment   -------------------------------------------------------------------------   Mid LM to Prox LAD lesion is 25% stenosed.   -------------------------------------------------------------------------   The left ventricular systolic function is normal, by echocardiogram; LV end diastolic pressure is normal.   ==========================================  POST-CATH FINDINGS Severe single-vessel CAD with heavily calcified eccentric proximal RCA 60% tapering to a 80% shelflike lesion followed by a napkin ring 85% stenosis Successful Shockwave Lithotripsy based DES PCI of the RCA (Synergy XD 3.0 mm x 38 mm postdilated to 3.6 mm), reducing stenoses to a few focal areas of 10% but otherwise 0% with TIMI-3 flow restored Otherwise moderate calcification in the distal LM-proximal LAD and LCx but at most 30% proximal LAD stenosis. Normal LVEDP (preserved EF by echo)   RECOMMENDATIONS   Continue titrate aggressive GDMT for CAD   Recommend uninterrupted dual antiplatelet therapy with Aspirin 81mg  daily and Ticagrelor 90mg  twice daily for a minimum of 12 months (ACS-Class I recommendation).   After 1 year, okay to DC ASA and continue with SAPT either with maintenance dose ticagrelor 60 mg twice daily or converting to complete a grill 75 mg daily.   In the absence of any  lesion. Post-Intervention Lesion Assessment The intervention was successful. Pre-interventional TIMI flow is 3. Post-intervention TIMI flow is 3. There is a 0% residual stenosis post intervention.   STRESS TESTS  MYOCARDIAL PERFUSION IMAGING 07/28/2022  Narrative   Findings are consistent with no ischemia and no infarction. The study is low risk.   No ST deviation was noted.   Left ventricular  function is normal. End diastolic cavity size is normal.   Prior study available for comparison from 01/15/2021.   ECHOCARDIOGRAM  ECHOCARDIOGRAM COMPLETE 10/23/2022  Narrative ECHOCARDIOGRAM REPORT    Patient Name:   Scott Koch Date of Exam: 10/23/2022 Medical Rec #:  [id number]    Height:       72.0 in Accession #:    [id number]   Weight:       213.0 lb Date of Birth:  20-Mar-1955    BSA:          2.188 m Patient Age:    67 years     BP:           116/71 mmHg Patient Gender: M            HR:           63 bpm. Exam Location:  Inpatient  Procedure: 2D Echo, Color Doppler, Cardiac Doppler and Strain Analysis  Indications:    NSTEMI I21.4  History:        Patient has prior history of Echocardiogram examinations. Signs/Symptoms:Murmur; Risk Factors:Hypertension.  Sonographer:    Neysa Bonito Roar Referring Phys: [id number] [facility] H HENDERSON   Sonographer Comments: Global longitudinal strain was attempted. IMPRESSIONS   1. Left ventricular ejection fraction, by estimation, is 55 to 60%. The left ventricle has normal function. The left ventricle has no regional wall motion abnormalities. Left ventricular diastolic parameters were normal. The average left ventricular global longitudinal strain is -19.0 %. The global longitudinal strain is normal. 2. Right ventricular systolic function is normal. The right ventricular size is normal. There is normal pulmonary artery systolic pressure. 3. The mitral valve is normal in structure. No evidence of mitral valve regurgitation. 4. The aortic valve is normal in structure. Aortic valve regurgitation is not visualized. 5. The inferior vena cava is normal in size with greater than 50% respiratory variability, suggesting right atrial pressure of 3 mmHg.  Conclusion(s)/Recommendation(s): Normal biventricular function without evidence of hemodynamically significant valvular heart disease.  FINDINGS Left Ventricle: Left ventricular ejection fraction, by  estimation, is 55 to 60%. The left ventricle has normal function. The left ventricle has no regional wall motion abnormalities. The average left ventricular global longitudinal strain is -19.0 %. The global longitudinal strain is normal. The left ventricular internal cavity size was normal in size. There is no left ventricular hypertrophy. Left ventricular diastolic parameters were normal.  Right Ventricle: The right ventricular size is normal. Right ventricular systolic function is normal. There is normal pulmonary artery systolic pressure. The tricuspid regurgitant velocity is 2.35 m/s, and with an assumed right atrial pressure of 3 mmHg, the estimated right ventricular systolic pressure is 25.1 mmHg.  Left Atrium: Left atrial size was normal in size.  Right Atrium: Right atrial size was normal in size.  Pericardium: There is no evidence of pericardial effusion.  Mitral Valve: The mitral valve is normal in structure. No evidence of mitral valve regurgitation. MV peak gradient, 2.9 mmHg. The mean mitral valve gradient is 2.0 mmHg.  Tricuspid Valve: Tricuspid valve regurgitation is mild.  Aortic Valve: The aortic valve is normal in  Cardiology Office Note:  .   Date:  11/02/2022  ID:  Scott Koch, DOB 05-02-1955, MRN [id number] PCP: Scott Like, NP  [facility] Providers Cardiologist:  Norman Herrlich, MD    History of Present Illness: .   Scott Koch is a 67 y.o. male with a past medical history of hypertension, CAD s/p DES to RCA 2024, diastolic heart failure, COPD, DM 2, hypothyroidism, tobacco use.  10/23/2022 echo EF 55 to 60%, no valvular abnormalities 10/25/2022 left heart cath severe single-vessel CAD s/p PCI of RCA with DES x 1  Most recently admitted to the hospital 10/22/2022 for non-STEMI, underwent left heart cath revealing single-vessel severe CAD s/p shockwave lithotripsy and DES PCI of RCA.  Recommended DAPT with aspirin and Brilinta for 12 months, after that DC aspirin and continue Brilinta or Plavix.  His Crestor was increased as his LPA was elevated at 289.  He presents today for follow-up after his recent hospitalization as outlined above.  He is doing remarkably well, offers no formal complaints today.  He stopped his Imdur because it was causing him headaches--this was started prior to his left heart cath and advised he does not need to take it if he is not bothered by angina.  He is started walking 7 minutes twice daily.  He has heard from cardiac rehab and plans to start that next week.  He is trying to stop smoking, with the help of nicotine patch.  He is very dedicated to taking better control of his heart health. He denies chest pain, palpitations, dyspnea, pnd, orthopnea, n, v, dizziness, syncope, edema, weight gain, or early satiety.   ROS: Review of Systems  All other systems reviewed and are negative.    Studies Reviewed: .        Cardiac Studies & Procedures   CARDIAC CATHETERIZATION  CARDIAC CATHETERIZATION 10/25/2022  Narrative   Culprit Lesion Segment: prox RCA-1 lesion is 60% stenosed. Mid RCA lesion is 85% stenosed.  Prox RCA-2 lesion is 80% stenosed.   Following Balloon  PTCA and Shockwave Lithotripsy, a drug-eluting stent was successfully placed covering all 3 lesions of culprit segment, using a SYNERGY XD 3.0X38 => postdilated to 3.6 mm   Post intervention, there is a 0% residual stenosis in the distal 2 lesions with 10% residual stenosis in the heavily calcified eccentric 60% segment   -------------------------------------------------------------------------   Mid LM to Prox LAD lesion is 25% stenosed.   -------------------------------------------------------------------------   The left ventricular systolic function is normal, by echocardiogram; LV end diastolic pressure is normal.   ==========================================  POST-CATH FINDINGS Severe single-vessel CAD with heavily calcified eccentric proximal RCA 60% tapering to a 80% shelflike lesion followed by a napkin ring 85% stenosis Successful Shockwave Lithotripsy based DES PCI of the RCA (Synergy XD 3.0 mm x 38 mm postdilated to 3.6 mm), reducing stenoses to a few focal areas of 10% but otherwise 0% with TIMI-3 flow restored Otherwise moderate calcification in the distal LM-proximal LAD and LCx but at most 30% proximal LAD stenosis. Normal LVEDP (preserved EF by echo)   RECOMMENDATIONS   Continue titrate aggressive GDMT for CAD   Recommend uninterrupted dual antiplatelet therapy with Aspirin 81mg  daily and Ticagrelor 90mg  twice daily for a minimum of 12 months (ACS-Class I recommendation).   After 1 year, okay to DC ASA and continue with SAPT either with maintenance dose ticagrelor 60 mg twice daily or converting to complete a grill 75 mg daily.   In the absence of any  lesion. Post-Intervention Lesion Assessment The intervention was successful. Pre-interventional TIMI flow is 3. Post-intervention TIMI flow is 3. There is a 0% residual stenosis post intervention.   STRESS TESTS  MYOCARDIAL PERFUSION IMAGING 07/28/2022  Narrative   Findings are consistent with no ischemia and no infarction. The study is low risk.   No ST deviation was noted.   Left ventricular  function is normal. End diastolic cavity size is normal.   Prior study available for comparison from 01/15/2021.   ECHOCARDIOGRAM  ECHOCARDIOGRAM COMPLETE 10/23/2022  Narrative ECHOCARDIOGRAM REPORT    Patient Name:   Scott Koch Date of Exam: 10/23/2022 Medical Rec #:  [id number]    Height:       72.0 in Accession #:    [id number]   Weight:       213.0 lb Date of Birth:  20-Mar-1955    BSA:          2.188 m Patient Age:    67 years     BP:           116/71 mmHg Patient Gender: M            HR:           63 bpm. Exam Location:  Inpatient  Procedure: 2D Echo, Color Doppler, Cardiac Doppler and Strain Analysis  Indications:    NSTEMI I21.4  History:        Patient has prior history of Echocardiogram examinations. Signs/Symptoms:Murmur; Risk Factors:Hypertension.  Sonographer:    Neysa Bonito Roar Referring Phys: [id number] [facility] H HENDERSON   Sonographer Comments: Global longitudinal strain was attempted. IMPRESSIONS   1. Left ventricular ejection fraction, by estimation, is 55 to 60%. The left ventricle has normal function. The left ventricle has no regional wall motion abnormalities. Left ventricular diastolic parameters were normal. The average left ventricular global longitudinal strain is -19.0 %. The global longitudinal strain is normal. 2. Right ventricular systolic function is normal. The right ventricular size is normal. There is normal pulmonary artery systolic pressure. 3. The mitral valve is normal in structure. No evidence of mitral valve regurgitation. 4. The aortic valve is normal in structure. Aortic valve regurgitation is not visualized. 5. The inferior vena cava is normal in size with greater than 50% respiratory variability, suggesting right atrial pressure of 3 mmHg.  Conclusion(s)/Recommendation(s): Normal biventricular function without evidence of hemodynamically significant valvular heart disease.  FINDINGS Left Ventricle: Left ventricular ejection fraction, by  estimation, is 55 to 60%. The left ventricle has normal function. The left ventricle has no regional wall motion abnormalities. The average left ventricular global longitudinal strain is -19.0 %. The global longitudinal strain is normal. The left ventricular internal cavity size was normal in size. There is no left ventricular hypertrophy. Left ventricular diastolic parameters were normal.  Right Ventricle: The right ventricular size is normal. Right ventricular systolic function is normal. There is normal pulmonary artery systolic pressure. The tricuspid regurgitant velocity is 2.35 m/s, and with an assumed right atrial pressure of 3 mmHg, the estimated right ventricular systolic pressure is 25.1 mmHg.  Left Atrium: Left atrial size was normal in size.  Right Atrium: Right atrial size was normal in size.  Pericardium: There is no evidence of pericardial effusion.  Mitral Valve: The mitral valve is normal in structure. No evidence of mitral valve regurgitation. MV peak gradient, 2.9 mmHg. The mean mitral valve gradient is 2.0 mmHg.  Tricuspid Valve: Tricuspid valve regurgitation is mild.  Aortic Valve: The aortic valve is normal in  Cardiology Office Note:  .   Date:  11/02/2022  ID:  Scott Koch, DOB 05-02-1955, MRN [id number] PCP: Scott Like, NP  [facility] Providers Cardiologist:  Norman Herrlich, MD    History of Present Illness: .   Scott Koch is a 67 y.o. male with a past medical history of hypertension, CAD s/p DES to RCA 2024, diastolic heart failure, COPD, DM 2, hypothyroidism, tobacco use.  10/23/2022 echo EF 55 to 60%, no valvular abnormalities 10/25/2022 left heart cath severe single-vessel CAD s/p PCI of RCA with DES x 1  Most recently admitted to the hospital 10/22/2022 for non-STEMI, underwent left heart cath revealing single-vessel severe CAD s/p shockwave lithotripsy and DES PCI of RCA.  Recommended DAPT with aspirin and Brilinta for 12 months, after that DC aspirin and continue Brilinta or Plavix.  His Crestor was increased as his LPA was elevated at 289.  He presents today for follow-up after his recent hospitalization as outlined above.  He is doing remarkably well, offers no formal complaints today.  He stopped his Imdur because it was causing him headaches--this was started prior to his left heart cath and advised he does not need to take it if he is not bothered by angina.  He is started walking 7 minutes twice daily.  He has heard from cardiac rehab and plans to start that next week.  He is trying to stop smoking, with the help of nicotine patch.  He is very dedicated to taking better control of his heart health. He denies chest pain, palpitations, dyspnea, pnd, orthopnea, n, v, dizziness, syncope, edema, weight gain, or early satiety.   ROS: Review of Systems  All other systems reviewed and are negative.    Studies Reviewed: .        Cardiac Studies & Procedures   CARDIAC CATHETERIZATION  CARDIAC CATHETERIZATION 10/25/2022  Narrative   Culprit Lesion Segment: prox RCA-1 lesion is 60% stenosed. Mid RCA lesion is 85% stenosed.  Prox RCA-2 lesion is 80% stenosed.   Following Balloon  PTCA and Shockwave Lithotripsy, a drug-eluting stent was successfully placed covering all 3 lesions of culprit segment, using a SYNERGY XD 3.0X38 => postdilated to 3.6 mm   Post intervention, there is a 0% residual stenosis in the distal 2 lesions with 10% residual stenosis in the heavily calcified eccentric 60% segment   -------------------------------------------------------------------------   Mid LM to Prox LAD lesion is 25% stenosed.   -------------------------------------------------------------------------   The left ventricular systolic function is normal, by echocardiogram; LV end diastolic pressure is normal.   ==========================================  POST-CATH FINDINGS Severe single-vessel CAD with heavily calcified eccentric proximal RCA 60% tapering to a 80% shelflike lesion followed by a napkin ring 85% stenosis Successful Shockwave Lithotripsy based DES PCI of the RCA (Synergy XD 3.0 mm x 38 mm postdilated to 3.6 mm), reducing stenoses to a few focal areas of 10% but otherwise 0% with TIMI-3 flow restored Otherwise moderate calcification in the distal LM-proximal LAD and LCx but at most 30% proximal LAD stenosis. Normal LVEDP (preserved EF by echo)   RECOMMENDATIONS   Continue titrate aggressive GDMT for CAD   Recommend uninterrupted dual antiplatelet therapy with Aspirin 81mg  daily and Ticagrelor 90mg  twice daily for a minimum of 12 months (ACS-Class I recommendation).   After 1 year, okay to DC ASA and continue with SAPT either with maintenance dose ticagrelor 60 mg twice daily or converting to complete a grill 75 mg daily.   In the absence of any

## 2022-11-02 ENCOUNTER — Ambulatory Visit: Payer: PPO | Attending: Cardiology | Admitting: Cardiology

## 2022-11-02 ENCOUNTER — Encounter: Payer: Self-pay | Admitting: Cardiology

## 2022-11-02 VITALS — BP 120/78 | HR 81 | Ht 72.0 in | Wt 209.0 lb

## 2022-11-02 DIAGNOSIS — I5030 Unspecified diastolic (congestive) heart failure: Secondary | ICD-10-CM | POA: Diagnosis not present

## 2022-11-02 DIAGNOSIS — I10 Essential (primary) hypertension: Secondary | ICD-10-CM | POA: Diagnosis not present

## 2022-11-02 DIAGNOSIS — E7841 Elevated Lipoprotein(a): Secondary | ICD-10-CM | POA: Diagnosis not present

## 2022-11-02 DIAGNOSIS — E782 Mixed hyperlipidemia: Secondary | ICD-10-CM | POA: Diagnosis not present

## 2022-11-02 DIAGNOSIS — Z720 Tobacco use: Secondary | ICD-10-CM | POA: Diagnosis not present

## 2022-11-02 DIAGNOSIS — I25118 Atherosclerotic heart disease of native coronary artery with other forms of angina pectoris: Secondary | ICD-10-CM | POA: Diagnosis not present

## 2022-11-02 NOTE — Patient Instructions (Signed)
Medication Instructions:  Your physician recommends that you continue on your current medications as directed. Please refer to the Current Medication list given to you today.  *If you need a refill on your cardiac medications before your next appointment, please call your pharmacy*   Lab Work: Your physician recommends that you return for lab work in:   Labs in 7 weeks: CMP, Lipid  If you have labs (blood work) drawn today and your tests are completely normal, you will receive your results only by: MyChart Message (if you have MyChart) OR A paper copy in the mail If you have any lab test that is abnormal or we need to change your treatment, we will call you to review the results.   Testing/Procedures: None   Follow-Up: At [facility], you and your health needs are our priority.  As part of our continuing mission to provide you with exceptional heart care, we have created designated Provider Care Teams.  These Care Teams include your primary Cardiologist (physician) and Advanced Practice Providers (APPs -  Physician Assistants and Nurse Practitioners) who all work together to provide you with the care you need, when you need it.  We recommend signing up for the patient portal called "MyChart".  Sign up information is provided on this After Visit Summary.  MyChart is used to connect with patients for Virtual Visits (Telemedicine).  Patients are able to view lab/test results, encounter notes, upcoming appointments, etc.  Non-urgent messages can be sent to your provider as well.   To learn more about what you can do with MyChart, go to [email or web address].    Your next appointment:   3 month(s)  Provider:   Norman Herrlich, MD    Other Instructions None

## 2022-11-08 ENCOUNTER — Encounter: Payer: Self-pay | Admitting: Cardiology

## 2022-11-15 DIAGNOSIS — Z0000 Encounter for general adult medical examination without abnormal findings: Secondary | ICD-10-CM | POA: Diagnosis not present

## 2022-11-15 DIAGNOSIS — Z1331 Encounter for screening for depression: Secondary | ICD-10-CM | POA: Diagnosis not present

## 2022-11-15 DIAGNOSIS — I5032 Chronic diastolic (congestive) heart failure: Secondary | ICD-10-CM | POA: Diagnosis not present

## 2022-11-15 DIAGNOSIS — Z9181 History of falling: Secondary | ICD-10-CM | POA: Diagnosis not present

## 2022-11-15 DIAGNOSIS — I2510 Atherosclerotic heart disease of native coronary artery without angina pectoris: Secondary | ICD-10-CM | POA: Diagnosis not present

## 2022-11-15 DIAGNOSIS — J449 Chronic obstructive pulmonary disease, unspecified: Secondary | ICD-10-CM | POA: Diagnosis not present

## 2022-11-15 DIAGNOSIS — I110 Hypertensive heart disease with heart failure: Secondary | ICD-10-CM | POA: Diagnosis not present

## 2022-11-15 DIAGNOSIS — I252 Old myocardial infarction: Secondary | ICD-10-CM | POA: Diagnosis not present

## 2022-11-15 DIAGNOSIS — Z955 Presence of coronary angioplasty implant and graft: Secondary | ICD-10-CM | POA: Diagnosis not present

## 2022-11-26 DIAGNOSIS — J449 Chronic obstructive pulmonary disease, unspecified: Secondary | ICD-10-CM | POA: Diagnosis not present

## 2022-11-26 DIAGNOSIS — I252 Old myocardial infarction: Secondary | ICD-10-CM | POA: Diagnosis not present

## 2022-11-26 DIAGNOSIS — Z955 Presence of coronary angioplasty implant and graft: Secondary | ICD-10-CM | POA: Diagnosis not present

## 2022-11-26 DIAGNOSIS — I2510 Atherosclerotic heart disease of native coronary artery without angina pectoris: Secondary | ICD-10-CM | POA: Diagnosis not present

## 2022-11-26 DIAGNOSIS — I5032 Chronic diastolic (congestive) heart failure: Secondary | ICD-10-CM | POA: Diagnosis not present

## 2022-11-26 DIAGNOSIS — I110 Hypertensive heart disease with heart failure: Secondary | ICD-10-CM | POA: Diagnosis not present

## 2022-12-07 ENCOUNTER — Other Ambulatory Visit: Payer: Self-pay | Admitting: Cardiology

## 2022-12-16 DIAGNOSIS — E782 Mixed hyperlipidemia: Secondary | ICD-10-CM | POA: Diagnosis not present

## 2022-12-16 DIAGNOSIS — E034 Atrophy of thyroid (acquired): Secondary | ICD-10-CM | POA: Diagnosis not present

## 2022-12-16 DIAGNOSIS — Z6828 Body mass index (BMI) 28.0-28.9, adult: Secondary | ICD-10-CM | POA: Diagnosis not present

## 2022-12-16 DIAGNOSIS — E559 Vitamin D deficiency, unspecified: Secondary | ICD-10-CM | POA: Diagnosis not present

## 2022-12-16 DIAGNOSIS — H6192 Disorder of left external ear, unspecified: Secondary | ICD-10-CM | POA: Diagnosis not present

## 2022-12-16 DIAGNOSIS — I50810 Right heart failure, unspecified: Secondary | ICD-10-CM | POA: Diagnosis not present

## 2022-12-16 DIAGNOSIS — E1169 Type 2 diabetes mellitus with other specified complication: Secondary | ICD-10-CM | POA: Diagnosis not present

## 2022-12-16 DIAGNOSIS — K219 Gastro-esophageal reflux disease without esophagitis: Secondary | ICD-10-CM | POA: Diagnosis not present

## 2022-12-16 DIAGNOSIS — J449 Chronic obstructive pulmonary disease, unspecified: Secondary | ICD-10-CM | POA: Diagnosis not present

## 2022-12-16 LAB — LAB REPORT - SCANNED
A1c: 6.9
EGFR: 96

## 2022-12-21 DIAGNOSIS — D72829 Elevated white blood cell count, unspecified: Secondary | ICD-10-CM | POA: Diagnosis not present

## 2022-12-27 DIAGNOSIS — Z955 Presence of coronary angioplasty implant and graft: Secondary | ICD-10-CM | POA: Diagnosis not present

## 2022-12-27 DIAGNOSIS — I252 Old myocardial infarction: Secondary | ICD-10-CM | POA: Diagnosis not present

## 2022-12-27 DIAGNOSIS — I5032 Chronic diastolic (congestive) heart failure: Secondary | ICD-10-CM | POA: Diagnosis not present

## 2022-12-27 DIAGNOSIS — I2510 Atherosclerotic heart disease of native coronary artery without angina pectoris: Secondary | ICD-10-CM | POA: Diagnosis not present

## 2022-12-27 DIAGNOSIS — J449 Chronic obstructive pulmonary disease, unspecified: Secondary | ICD-10-CM | POA: Diagnosis not present

## 2022-12-27 DIAGNOSIS — I110 Hypertensive heart disease with heart failure: Secondary | ICD-10-CM | POA: Diagnosis not present

## 2023-01-03 DIAGNOSIS — D1801 Hemangioma of skin and subcutaneous tissue: Secondary | ICD-10-CM | POA: Diagnosis not present

## 2023-01-03 DIAGNOSIS — L821 Other seborrheic keratosis: Secondary | ICD-10-CM | POA: Diagnosis not present

## 2023-01-06 ENCOUNTER — Ambulatory Visit: Payer: PPO | Admitting: Podiatry

## 2023-01-06 DIAGNOSIS — B351 Tinea unguium: Secondary | ICD-10-CM | POA: Diagnosis not present

## 2023-01-06 DIAGNOSIS — M79675 Pain in left toe(s): Secondary | ICD-10-CM | POA: Diagnosis not present

## 2023-01-06 DIAGNOSIS — M79674 Pain in right toe(s): Secondary | ICD-10-CM

## 2023-01-06 NOTE — Progress Notes (Signed)
Subjective:  Patient ID: Scott Koch, male    DOB: 1955-06-07,  MRN: [id number]  Scott Koch presents to clinic today for:  Chief Complaint  Patient presents with   Diabetes    DFC A1C - 6.5 Blood Thinner   Patient notes nails are thick, discolored, elongated and painful in shoegear when trying to ambulate.    PCP is [name], [name], NP.  Past Medical History:  Diagnosis Date   Asthma    inhaler used only when seasonal allergies    Complication of anesthesia    patient states"gets rowdy" when wake up   COPD (chronic obstructive pulmonary disease) (HCC)    Diabetes mellitus without complication (HCC)    fasting blood sugar avg 120   Heart murmur    Hypertension    Hypothyroidism    Pneumonia    hx   Protein in urine    Shortness of breath dyspnea    hx    Past Surgical History:  Procedure Laterality Date   ANTERIOR CERVICAL DECOMP/DISCECTOMY FUSION  01/13/2012   Procedure: ANTERIOR CERVICAL DECOMPRESSION/DISCECTOMY FUSION 3 LEVELS;  Surgeon: Mariam Dollar, MD;  Location: [facility];  Service: Neurosurgery;  Laterality: N/A;  Cervical three-four, cervical four five, cervical six-seven anterior cervical decompression fusion with plate    CORONARY ANGIOPLASTY  2000   stent   CORONARY LITHOTRIPSY N/A 10/25/2022   Procedure: CORONARY LITHOTRIPSY;  Surgeon: Marykay Lex, MD;  Location: [facility] INVASIVE CV LAB;  Service: Cardiovascular;  Laterality: N/A;   CORONARY STENT INTERVENTION N/A 10/25/2022   Procedure: CORONARY STENT INTERVENTION;  Surgeon: Marykay Lex, MD;  Location: [facility] INVASIVE CV LAB;  Service: Cardiovascular;  Laterality: N/A;   LEFT HEART CATH AND CORONARY ANGIOGRAPHY N/A 10/25/2022   Procedure: LEFT HEART CATH AND CORONARY ANGIOGRAPHY;  Surgeon: Marykay Lex, MD;  Location: [facility] INVASIVE CV LAB;  Service: Cardiovascular;  Laterality: N/A;   SHOULDER ARTHROSCOPY Right 14    Allergies  Allergen Reactions   Hazelnut (Filbert) Anaphylaxis   Norel Ad  [Chlorphen-Pe-Acetaminophen] Anaphylaxis   Penicillins Anaphylaxis   Sulfa Antibiotics Hives   Neosporin [Neomycin-Bacitracin Zn-Polymyx] Rash and Other (See Comments)    Redness around application site   Review of Systems: Negative except as noted in the HPI.  Objective:  Scott Koch is a pleasant 67 y.o. male in NAD. AAO x 3.  Vascular Examination: Capillary refill time is 3-5 seconds to toes bilateral. Palpable pedal pulses b/l LE. Digital hair present b/l.  Skin temperature gradient WNL b/l. No varicosities b/l. No cyanosis noted b/l.   Dermatological Examination: Pedal skin with normal turgor, texture and tone b/l. No open wounds. No interdigital macerations b/l. Toenails x10 are 3mm thick, discolored, dystrophic with subungual debris. There is pain with compression of the nail plates.  They are elongated x10     Latest Ref Rng & Units 10/23/2022    8:43 AM  Hemoglobin A1C  Hemoglobin-A1c 4.8 - 5.6 % 6.7    Assessment/Plan: 1. Pain due to onychomycosis of toenails of both feet    The mycotic toenails were sharply debrided x10 with sterile nail nippers and a power debriding burr to decrease bulk/thickness and length.    Return in about 3 months (around 04/06/2023) for [facility].   Clerance Lav, DPM, FACFAS [facility]     [address]  [place], [place] [address]                Office [phone]  Fax [phone]

## 2023-01-26 NOTE — Progress Notes (Signed)
 Cardiology Office Note:    Date:  01/28/2023   ID:  Scott Koch, DOB Mar 06, 1955, MRN [id number]  PCP:  Pandora Therisa RAMAN, NP  Cardiologist:  Redell Leiter, MD    Referring MD: Pandora Therisa RAMAN, NP    ASSESSMENT:    1. Coronary artery disease of native artery of native heart with stable angina pectoris (HCC)   2. Hypertensive heart disease with heart failure (HCC)   3. Mixed hyperlipidemia   4. Elevated lipoprotein A level   5. Diabetes mellitus without complication (HCC)    PLAN:    In order of problems listed above:  He is doing well following his most recent cardiac intervention he will continue his long-term dual antiplatelet therapy for 1 year and at that time I would discontinue torsemide  and keep him on clopidogrel  along medical therapy includes antihypertensives and his high intensity statin. Continue cardiac rehabilitation Stable diabetes managed with his PCP Complains of cool extremities I will ask his PCP to do iron studies with his next labs he is on a PPI which can interfere with absorption   Next appointment: 6 months   Medication Adjustments/Labs and Tests Ordered: Current medicines are reviewed at length with the patient today.  Concerns regarding medicines are outlined above.  No orders of the defined types were placed in this encounter.  No orders of the defined types were placed in this encounter.    History of Present Illness:    Scott Koch is a 68 y.o. male with a hx of coronary artery disease with drug-eluting stent to the right coronary artery 10/23/2022 in the setting of acute coronary syndrome hypertensive heart disease with diastolic heart failure type 2 diabetes hyperlipidemia hypothyroidism and COPD last seen in 08/2022.  He had PCI and stent complex right coronary artery 10/25/2022 in the context of acute coronary syndrome non-ST elevation MI left ventricular ejection fraction is normal 55 to 60%  Compliance with diet, lifestyle and medications:  Yes  He continues in cardiac rehabilitation he feels very well he is committed to continuing his exercise program his wife is present participates and is really guiding him in a healthy lifestyle including stress reduction and diet He complains of cool extremities I am going to ask him to start a multivitamin with iron I will ask his PCP with his next labs to check an iron profile He tolerates his lipid-lowering therapy without muscle pain or weakness His last lipid profile 12/16/2022 with an LDL of 56 total 121 hemoglobin 14.7 creatinine 0.94 Past Medical History:  Diagnosis Date   Asthma    inhaler used only when seasonal allergies    Chronic diastolic heart failure (HCC) 10/24/2022   Complication of anesthesia    patient statesgets rowdy when wake up   COPD (chronic obstructive pulmonary disease) (HCC)    Diabetes mellitus without complication (HCC)    fasting blood sugar avg 120   Heart murmur    HNP (herniated nucleus pulposus), lumbar 01/07/2014   Hypertension    Hypothyroidism    NSTEMI (non-ST elevated myocardial infarction) (HCC) 10/23/2022   Pneumonia    hx   Protein in urine    Shortness of breath dyspnea    hx   Type 2 diabetes mellitus with complication, with long-term current use of insulin  (HCC) 10/23/2022    Current Medications: Current Meds  Medication Sig   acetaminophen  (TYLENOL ) 500 MG tablet Take 1,000 mg by mouth 2 (two) times daily as needed for moderate pain, headache or fever.  albuterol  (PROVENTIL  HFA;VENTOLIN  HFA) 108 (90 BASE) MCG/ACT inhaler Inhale 2 puffs into the lungs every 6 (six) hours as needed for wheezing or shortness of breath. For shortness of breath   aspirin  EC 81 MG tablet Take 81 mg by mouth daily.   Bioflavonoid Products (VITAMIN C) CHEW Chew 1 tablet by mouth daily.   cetirizine (ZYRTEC) 10 MG tablet Take 10 mg by mouth daily.   Dulaglutide (TRULICITY) 0.75 MG/0.5ML SOPN Inject 0.75 mg into the skin once a week.    ipratropium-albuterol  (DUONEB) 0.5-2.5 (3) MG/3ML SOLN Take 3 mLs by nebulization every 6 (six) hours as needed (wheezing, shortness of breath).   JANUMET XR 50-1000 MG TB24 Take 1 tablet by mouth at bedtime.   ketoconazole  (NIZORAL ) 2 % cream Apply 1 Application topically 2 (two) times daily. Apply 1gm to heel areas twice daily and rub in well   LANTUS SOLOSTAR 100 UNIT/ML Solostar Pen Inject 20 Units into the skin daily as needed (BS > 120 in the morning).   levothyroxine  (SYNTHROID ) 100 MCG tablet Take 100 mcg by mouth daily before breakfast.   losartan  (COZAAR ) 25 MG tablet Take 25 mg by mouth daily.   montelukast  (SINGULAIR ) 10 MG tablet Take 10 mg by mouth at bedtime.   nitroGLYCERIN  (NITROSTAT ) 0.4 MG SL tablet Place 1 tablet (0.4 mg total) under the tongue every 5 (five) minutes x 3 doses as needed for chest pain.   omeprazole (PRILOSEC) 20 MG capsule Take 20 mg by mouth daily.   ONETOUCH VERIO test strip 1 each by Other route 2 (two) times daily.   rosuvastatin  (CRESTOR ) 20 MG tablet Take 1 tablet (20 mg total) by mouth daily.   tamsulosin  (FLOMAX ) 0.4 MG CAPS capsule Take 0.4 mg by mouth daily.   ticagrelor  (BRILINTA ) 90 MG TABS tablet Take 1 tablet (90 mg total) by mouth 2 (two) times daily.   torsemide  (DEMADEX ) 20 MG tablet Take 1 tablet (20 mg total) by mouth daily.   Vitamin D, Ergocalciferol, (DRISDOL) 1.25 MG (50000 UNIT) CAPS capsule Take 50,000 Units by mouth every Friday.   VITAMIN E PO Take 1 capsule by mouth daily.      EKGs/Labs/Other Studies Reviewed:    The following studies were reviewed today:  Cardiac Studies & Procedures   CARDIAC CATHETERIZATION  CARDIAC CATHETERIZATION 10/25/2022  Narrative   Culprit Lesion Segment: prox RCA-1 lesion is 60% stenosed. Mid RCA lesion is 85% stenosed.  Prox RCA-2 lesion is 80% stenosed.   Following Balloon PTCA and Shockwave Lithotripsy, a drug-eluting stent was successfully placed covering all 3 lesions of culprit segment,  using a SYNERGY XD 3.0X38 => postdilated to 3.6 mm   Post intervention, there is a 0% residual stenosis in the distal 2 lesions with 10% residual stenosis in the heavily calcified eccentric 60% segment   -------------------------------------------------------------------------   Mid LM to Prox LAD lesion is 25% stenosed.   -------------------------------------------------------------------------   The left ventricular systolic function is normal, by echocardiogram; LV end diastolic pressure is normal.   ==========================================  POST-CATH FINDINGS Severe single-vessel CAD with heavily calcified eccentric proximal RCA 60% tapering to a 80% shelflike lesion followed by a napkin ring 85% stenosis Successful Shockwave Lithotripsy based DES PCI of the RCA (Synergy XD 3.0 mm x 38 mm postdilated to 3.6 mm), reducing stenoses to a few focal areas of 10% but otherwise 0% with TIMI-3 flow restored Otherwise moderate calcification in the distal LM-proximal LAD and LCx but at most 30% proximal LAD stenosis. Normal LVEDP (  preserved EF by echo)   RECOMMENDATIONS   Continue titrate aggressive GDMT for CAD   Recommend uninterrupted dual antiplatelet therapy with Aspirin  81mg  daily and Ticagrelor  90mg  twice daily for a minimum of 12 months (ACS-Class I recommendation).   After 1 year, okay to DC ASA and continue with SAPT either with maintenance dose ticagrelor  60 mg twice daily or converting to complete a grill 75 mg daily.   In the absence of any other complications or medical issues, we expect the patient to be ready for discharge from an interventional cardiology perspective on 10/25/2022.    Alm Clay, MD  Findings Coronary Findings Diagnostic  Dominance: Right  Left Main Vessel was injected. Vessel is normal in caliber. Mid LM to Prox LAD lesion is 25% stenosed.  Left Anterior Descending Vessel is moderate in size. The vessel exhibits minimal luminal  irregularities.  First Diagonal Branch Vessels moderate in size  Second Diagonal Branch Vessel is small in size.  Left Circumflex Vessel is small.  First Obtuse Marginal Branch Vessel is small in size. The vessel exhibits minimal luminal irregularities.  Right Coronary Artery Vessel was injected. Vessel is large. There is severe focal disease in the vessel. The vessel is severely calcified. Prox RCA-1 lesion is 60% stenosed. The lesion is segmental and eccentric. The lesion is severely calcified. Prox RCA-2 lesion is 80% stenosed. The lesion is eccentric, irregular and ulcerative. The lesion is severely calcified. Shelflike lesion Mid RCA lesion is 85% stenosed. The lesion is focal and concentric. The lesion is moderately calcified.  Acute Marginal Branch Vessel is small in size.  Right Ventricular Branch Vessel is small in size.  Right Posterior Descending Artery Vessel is small in size.  Right Posterior Atrioventricular Artery Vessel is moderate in size.  Second Right Posterolateral Branch Vessel is small in size.  Intervention  Prox RCA-1 lesion Angioplasty Lesion length:  16 mm. CATH SHOCKWAVE C2 3.5X12 guide catheter was inserted. WIRE ASAHI PROWATER 180CM guidewire used to cross lesion. Balloon angioplasty was performed. Maximum pressure: 4 atm. Inflation time: 10 sec. A total of 4 bursts of 10 pulses Stent (Also treats lesions: Prox RCA-2, and Mid RCA) Lesion length:  36 mm. CATH VISTA GUIDE 6FR JR4 guide catheter was inserted. Lesion crossed with guidewire using a WIRE ASAHI PROWATER 180CM. Pre-stent angioplasty was performed using a BALLN SAPPHIRE 3.25X20. Maximum pressure:  12 atm. Inflation time:  20 sec. BALLN SAPPHIRE 2.5X20 -&gt; 12 ATM x 20 sec A drug-eluting stent was successfully placed using a SYNERGY XD 3.0X38. Following the 3.0 mm balloon angioplasty, 3.5 mm shockwave balloon used throughout the target segment; stent covers all 3 portions of the diseased  segment Maximum pressure: 18 atm. Inflation time: 30 sec. Stent strut is well apposed. Postdilated to 3.6 mm Post-stent angioplasty was performed using a BALL SAPPHIRE NC24 3.5X18. Maximum pressure:  16 atm. Inflation time:  20 sec. Also 3.5 x 8 [place] balloon used-18 ATM x 20 seconds throughout the entire stent Post-Intervention Lesion Assessment The intervention was successful. Pre-interventional TIMI flow is 3. Post-intervention TIMI flow is 3. Treated lesion length:  38 mm. No complications occurred at this lesion. There is a 10% residual stenosis post intervention.  Prox RCA-2 lesion Angioplasty Lesion length:  8 mm. CATH SHOCKWAVE C2 3.5X12 guide catheter was inserted. WIRE ASAHI PROWATER 180CM guidewire used to cross lesion. Balloon angioplasty was performed. Stent (Also treats lesions: Prox RCA-1, and Mid RCA) See details in Prox RCA-1 lesion. Post-Intervention Lesion Assessment The intervention was  successful. Pre-interventional TIMI flow is 3. Post-intervention TIMI flow is 3. Treated lesion length:  10 mm. No complications occurred at this lesion. There is a 0% residual stenosis post intervention.  Mid RCA lesion Angioplasty Lesion length:  8 mm. CATH SHOCKWAVE C2 3.5X12 guide catheter was inserted. WIRE ASAHI PROWATER 180CM guidewire used to cross lesion. Balloon angioplasty was performed. Lithotripsy balloon Stent (Also treats lesions: Prox RCA-1, and Prox RCA-2) See details in Prox RCA-1 lesion. Post-Intervention Lesion Assessment The intervention was successful. Pre-interventional TIMI flow is 3. Post-intervention TIMI flow is 3. There is a 0% residual stenosis post intervention.   STRESS TESTS  MYOCARDIAL PERFUSION IMAGING 07/28/2022  Narrative   Findings are consistent with no ischemia and no infarction. The study is low risk.   No ST deviation was noted.   Left ventricular function is normal. End diastolic cavity size is normal.   Prior study available for comparison from  01/15/2021.  ECHOCARDIOGRAM  ECHOCARDIOGRAM COMPLETE 10/23/2022  Narrative ECHOCARDIOGRAM REPORT    Patient Name:   Scott Koch Date of Exam: 10/23/2022 Medical Rec #:  [id number]    Height:       72.0 in Accession #:    [id number]   Weight:       213.0 lb Date of Birth:  March 11, 1955    BSA:          2.188 m Patient Age:    67 years     BP:           116/71 mmHg Patient Gender: M            HR:           63 bpm. Exam Location:  Inpatient  Procedure: 2D Echo, Color Doppler, Cardiac Doppler and Strain Analysis  Indications:    NSTEMI I21.4  History:        Patient has prior history of Echocardiogram examinations. Signs/Symptoms:Murmur; Risk Factors:Hypertension.  Sonographer:    Bari Roar Referring Phys: [id number] [facility] HENDERSON   Sonographer Comments: Global longitudinal strain was attempted. IMPRESSIONS   1. Left ventricular ejection fraction, by estimation, is 55 to 60%. The left ventricle has normal function. The left ventricle has no regional wall motion abnormalities. Left ventricular diastolic parameters were normal. The average left ventricular global longitudinal strain is -19.0 %. The global longitudinal strain is normal. 2. Right ventricular systolic function is normal. The right ventricular size is normal. There is normal pulmonary artery systolic pressure. 3. The mitral valve is normal in structure. No evidence of mitral valve regurgitation. 4. The aortic valve is normal in structure. Aortic valve regurgitation is not visualized. 5. The inferior vena cava is normal in size with greater than 50% respiratory variability, suggesting right atrial pressure of 3 mmHg.  Conclusion(s)/Recommendation(s): Normal biventricular function without evidence of hemodynamically significant valvular heart disease.  FINDINGS Left Ventricle: Left ventricular ejection fraction, by estimation, is 55 to 60%. The left ventricle has normal function. The left ventricle has no regional  wall motion abnormalities. The average left ventricular global longitudinal strain is -19.0 %. The global longitudinal strain is normal. The left ventricular internal cavity size was normal in size. There is no left ventricular hypertrophy. Left ventricular diastolic parameters were normal.  Right Ventricle: The right ventricular size is normal. Right ventricular systolic function is normal. There is normal pulmonary artery systolic pressure. The tricuspid regurgitant velocity is 2.35 m/s, and with an assumed right atrial pressure of 3 mmHg, the estimated right ventricular systolic pressure is  25.1 mmHg.  Left Atrium: Left atrial size was normal in size.  Right Atrium: Right atrial size was normal in size.  Pericardium: There is no evidence of pericardial effusion.  Mitral Valve: The mitral valve is normal in structure. No evidence of mitral valve regurgitation. MV peak gradient, 2.9 mmHg. The mean mitral valve gradient is 2.0 mmHg.  Tricuspid Valve: Tricuspid valve regurgitation is mild.  Aortic Valve: The aortic valve is normal in structure. Aortic valve regurgitation is not visualized. Aortic valve mean gradient measures 2.0 mmHg. Aortic valve peak gradient measures 4.2 mmHg. Aortic valve area, by VTI measures 3.29 cm.  Pulmonic Valve: Pulmonic valve regurgitation is not visualized.  Aorta: The aortic root and ascending aorta are structurally normal, with no evidence of dilitation.  Venous: The inferior vena cava is normal in size with greater than 50% respiratory variability, suggesting right atrial pressure of 3 mmHg.  IAS/Shunts: No atrial level shunt detected by color flow Doppler.   LEFT VENTRICLE PLAX 2D LVIDd:         4.40 cm   Diastology LVIDs:         3.10 cm   LV e' medial:    9.57 cm/s LV PW:         0.90 cm   LV E/e' medial:  9.5 LV IVS:        1.00 cm   LV e' lateral:   13.90 cm/s LVOT diam:     2.10 cm   LV E/e' lateral: 6.5 LV SV:         61 LV SV Index:   28         2D Longitudinal Strain LVOT Area:     3.46 cm  2D Strain GLS Avg:     -19.0 %   RIGHT VENTRICLE RV Basal diam:  3.30 cm RV Mid diam:    2.80 cm RV S prime:     15.10 cm/s TAPSE (M-mode): 2.2 cm  LEFT ATRIUM             Index        RIGHT ATRIUM           Index LA diam:        3.40 cm 1.55 cm/m   RA Area:     14.70 cm LA Vol (A2C):   45.7 ml 20.88 ml/m  RA Volume:   34.60 ml  15.81 ml/m LA Vol (A4C):   31.5 ml 14.40 ml/m LA Biplane Vol: 39.6 ml 18.10 ml/m AORTIC VALVE                    PULMONIC VALVE AV Area (Vmax):    3.09 cm     PV Vmax:          1.01 m/s AV Area (Vmean):   3.02 cm     PV Peak grad:     4.1 mmHg AV Area (VTI):     3.29 cm     PR End Diast Vel: 3.76 msec AV Vmax:           102.00 cm/s  RVOT Peak grad:   2 mmHg AV Vmean:          69.600 cm/s AV VTI:            0.184 m AV Peak Grad:      4.2 mmHg AV Mean Grad:      2.0 mmHg LVOT Vmax:         91.00 cm/s  LVOT Vmean:        60.700 cm/s LVOT VTI:          0.175 m LVOT/AV VTI ratio: 0.95  AORTA Ao Sinus diam: 2.90 cm Ao STJ diam:   2.7 cm Ao Asc diam:   2.90 cm  MITRAL VALVE               TRICUSPID VALVE MV Area (PHT): 4.36 cm    TR Peak grad:   22.1 mmHg MV Area VTI:   2.55 cm    TR Vmax:        235.00 cm/s MV Peak grad:  2.9 mmHg MV Mean grad:  2.0 mmHg    SHUNTS MV Vmax:       0.85 m/s    Systemic VTI:  0.18 m MV Vmean:      60.2 cm/s   Systemic Diam: 2.10 cm MV Decel Time: 174 msec MV E velocity: 90.70 cm/s MV A velocity: 93.30 cm/s MV E/A ratio:  0.97  Ronal Ross Electronically signed by Ronal Ross Signature Date/Time: 10/23/2022/1:54:42 PM    Final                 Recent Labs: 10/26/2022: BUN 10; Creatinine, Ser 0.90; Hemoglobin 14.3; Platelets 243; Potassium 4.6; Sodium 140  Recent Lipid Panel    Component Value Date/Time   CHOL 129 10/24/2022 0359   TRIG 147 10/24/2022 0359   HDL 33 (L) 10/24/2022 0359   CHOLHDL 3.9 10/24/2022 0359   VLDL 29 10/24/2022 0359    LDLCALC 67 10/24/2022 0359    Physical Exam:    VS:  BP 118/68   Pulse 66   Ht 6' (1.829 m)   Wt 211 lb 12.8 oz (96.1 kg)   SpO2 97%   BMI 28.73 kg/m     Wt Readings from Last 3 Encounters:  01/28/23 211 lb 12.8 oz (96.1 kg)  11/02/22 209 lb (94.8 kg)  10/24/22 212 lb 15.4 oz (96.6 kg)     GEN:  Well nourished, well developed in no acute distress HEENT: Normal NECK: No JVD; No carotid bruits LYMPHATICS: No lymphadenopathy CARDIAC: RRR, no murmurs, rubs, gallops RESPIRATORY:  Clear to auscultation without rales, wheezing or rhonchi  ABDOMEN: Soft, non-tender, non-distended MUSCULOSKELETAL:  No edema; No deformity  SKIN: Warm and dry NEUROLOGIC:  Alert and oriented x 3 PSYCHIATRIC:  Normal affect    Signed, Redell Leiter, MD  01/28/2023 8:41 AM    [facility]

## 2023-01-27 ENCOUNTER — Encounter: Payer: Self-pay | Admitting: Cardiology

## 2023-01-28 ENCOUNTER — Ambulatory Visit: Payer: PPO | Attending: Cardiology | Admitting: Cardiology

## 2023-01-28 ENCOUNTER — Encounter: Payer: Self-pay | Admitting: Cardiology

## 2023-01-28 VITALS — BP 118/68 | HR 66 | Ht 72.0 in | Wt 211.8 lb

## 2023-01-28 DIAGNOSIS — E782 Mixed hyperlipidemia: Secondary | ICD-10-CM

## 2023-01-28 DIAGNOSIS — E119 Type 2 diabetes mellitus without complications: Secondary | ICD-10-CM

## 2023-01-28 DIAGNOSIS — I25118 Atherosclerotic heart disease of native coronary artery with other forms of angina pectoris: Secondary | ICD-10-CM | POA: Diagnosis not present

## 2023-01-28 DIAGNOSIS — I110 Hypertensive heart disease with heart failure: Secondary | ICD-10-CM

## 2023-01-28 DIAGNOSIS — E7841 Elevated Lipoprotein(a): Secondary | ICD-10-CM | POA: Diagnosis not present

## 2023-01-28 NOTE — Patient Instructions (Addendum)
 Medication Instructions:  Your physician recommends that you continue on your current medications as directed. Please refer to the Current Medication list given to you today.  Start a multivitamin with iron   *If you need a refill on your cardiac medications before your next appointment, please call your pharmacy*   Lab Work: None ordered If you have labs (blood work) drawn today and your tests are completely normal, you will receive your results only by: MyChart Message (if you have MyChart) OR A paper copy in the mail If you have any lab test that is abnormal or we need to change your treatment, we will call you to review the results.   Testing/Procedures: None ordered   Follow-Up: At [facility], you and your health needs are our priority.  As part of our continuing mission to provide you with exceptional heart care, we have created designated Provider Care Teams.  These Care Teams include your primary Cardiologist (physician) and Advanced Practice Providers (APPs -  Physician Assistants and Nurse Practitioners) who all work together to provide you with the care you need, when you need it.  We recommend signing up for the patient portal called MyChart.  Sign up information is provided on this After Visit Summary.  MyChart is used to connect with patients for Virtual Visits (Telemedicine).  Patients are able to view lab/test results, encounter notes, upcoming appointments, etc.  Non-urgent messages can be sent to your provider as well.   To learn more about what you can do with MyChart, go to [email or web address].    Your next appointment:   6 month(s)  The format for your next appointment:   In Person  Provider:   Redell Leiter, MD    Other Instructions none  Important Information About Sugar

## 2023-01-31 DIAGNOSIS — I2510 Atherosclerotic heart disease of native coronary artery without angina pectoris: Secondary | ICD-10-CM | POA: Diagnosis not present

## 2023-01-31 DIAGNOSIS — J449 Chronic obstructive pulmonary disease, unspecified: Secondary | ICD-10-CM | POA: Diagnosis not present

## 2023-01-31 DIAGNOSIS — I252 Old myocardial infarction: Secondary | ICD-10-CM | POA: Diagnosis not present

## 2023-01-31 DIAGNOSIS — I5032 Chronic diastolic (congestive) heart failure: Secondary | ICD-10-CM | POA: Diagnosis not present

## 2023-01-31 DIAGNOSIS — Z955 Presence of coronary angioplasty implant and graft: Secondary | ICD-10-CM | POA: Diagnosis not present

## 2023-01-31 DIAGNOSIS — I110 Hypertensive heart disease with heart failure: Secondary | ICD-10-CM | POA: Diagnosis not present

## 2023-03-04 DIAGNOSIS — I2510 Atherosclerotic heart disease of native coronary artery without angina pectoris: Secondary | ICD-10-CM | POA: Diagnosis not present

## 2023-03-04 DIAGNOSIS — Z955 Presence of coronary angioplasty implant and graft: Secondary | ICD-10-CM | POA: Diagnosis not present

## 2023-03-04 DIAGNOSIS — I252 Old myocardial infarction: Secondary | ICD-10-CM | POA: Diagnosis not present

## 2023-03-04 DIAGNOSIS — J449 Chronic obstructive pulmonary disease, unspecified: Secondary | ICD-10-CM | POA: Diagnosis not present

## 2023-03-04 DIAGNOSIS — I5032 Chronic diastolic (congestive) heart failure: Secondary | ICD-10-CM | POA: Diagnosis not present

## 2023-03-04 DIAGNOSIS — I110 Hypertensive heart disease with heart failure: Secondary | ICD-10-CM | POA: Diagnosis not present

## 2023-04-04 DIAGNOSIS — E034 Atrophy of thyroid (acquired): Secondary | ICD-10-CM | POA: Diagnosis not present

## 2023-04-04 DIAGNOSIS — E782 Mixed hyperlipidemia: Secondary | ICD-10-CM | POA: Diagnosis not present

## 2023-04-04 DIAGNOSIS — B351 Tinea unguium: Secondary | ICD-10-CM | POA: Diagnosis not present

## 2023-04-04 DIAGNOSIS — Z6828 Body mass index (BMI) 28.0-28.9, adult: Secondary | ICD-10-CM | POA: Diagnosis not present

## 2023-04-04 DIAGNOSIS — E1169 Type 2 diabetes mellitus with other specified complication: Secondary | ICD-10-CM | POA: Diagnosis not present

## 2023-04-04 DIAGNOSIS — J449 Chronic obstructive pulmonary disease, unspecified: Secondary | ICD-10-CM | POA: Diagnosis not present

## 2023-04-04 DIAGNOSIS — E785 Hyperlipidemia, unspecified: Secondary | ICD-10-CM | POA: Diagnosis not present

## 2023-04-04 DIAGNOSIS — K219 Gastro-esophageal reflux disease without esophagitis: Secondary | ICD-10-CM | POA: Diagnosis not present

## 2023-04-04 DIAGNOSIS — I50810 Right heart failure, unspecified: Secondary | ICD-10-CM | POA: Diagnosis not present

## 2023-04-04 LAB — LAB REPORT - SCANNED
A1c: 7.1
Albumin, Urine POC: 20.3
Creatinine, POC: 30.8 mg/dL
EGFR: 93
Free T4: 9.8 ng/dL
Microalb Creat Ratio: 66
TSH: 2.12 (ref 0.41–5.90)

## 2023-04-06 ENCOUNTER — Ambulatory Visit: Payer: PPO | Admitting: Podiatry

## 2023-04-06 DIAGNOSIS — M79675 Pain in left toe(s): Secondary | ICD-10-CM | POA: Diagnosis not present

## 2023-04-06 DIAGNOSIS — B351 Tinea unguium: Secondary | ICD-10-CM

## 2023-04-06 DIAGNOSIS — M79674 Pain in right toe(s): Secondary | ICD-10-CM

## 2023-04-06 NOTE — Progress Notes (Unsigned)
 Subjective:  Patient ID: Scott Koch, male    DOB: April 24, 1955,  MRN: [id number]   Scott Koch presents to clinic today for:  Chief Complaint  Patient presents with   [facility]    [facility] today no callous. He does need to have the 2nd toenail on the right foot. He thinks he stubbed it but the nail is broke. Last A1c was 6.2 3 months ago, he had new blood work drawn on Monday. Takes Asa 81 and Brilanta (new anti coag)   Patient notes nails are thick, discolored, elongated and painful in shoegear when trying to ambulate.    PCP is [name], [name], NP.  Past Medical History:  Diagnosis Date   Asthma    inhaler used only when seasonal allergies    Chronic diastolic heart failure (HCC) 10/24/2022   Complication of anesthesia    patient states"gets rowdy" when wake up   COPD (chronic obstructive pulmonary disease) (HCC)    Diabetes mellitus without complication (HCC)    fasting blood sugar avg 120   Heart murmur    HNP (herniated nucleus pulposus), lumbar 01/07/2014   Hypertension    Hypothyroidism    NSTEMI (non-ST elevated myocardial infarction) (HCC) 10/23/2022   Pneumonia    hx   Protein in urine    Shortness of breath dyspnea    hx   Type 2 diabetes mellitus with complication, with long-term current use of insulin (HCC) 10/23/2022    Past Surgical History:  Procedure Laterality Date   ANTERIOR CERVICAL DECOMP/DISCECTOMY FUSION  01/13/2012   Procedure: ANTERIOR CERVICAL DECOMPRESSION/DISCECTOMY FUSION 3 LEVELS;  Surgeon: Mariam Dollar, MD;  Location: [facility] NEURO ORS;  Service: Neurosurgery;  Laterality: N/A;  Cervical three-four, cervical four five, cervical six-seven anterior cervical decompression fusion with plate    CORONARY ANGIOPLASTY  2000   stent   CORONARY LITHOTRIPSY N/A 10/25/2022   Procedure: CORONARY LITHOTRIPSY;  Surgeon: Marykay Lex, MD;  Location: [facility] INVASIVE CV LAB;  Service: Cardiovascular;  Laterality: N/A;   CORONARY STENT INTERVENTION N/A 10/25/2022    Procedure: CORONARY STENT INTERVENTION;  Surgeon: Marykay Lex, MD;  Location: [facility] INVASIVE CV LAB;  Service: Cardiovascular;  Laterality: N/A;   LEFT HEART CATH AND CORONARY ANGIOGRAPHY N/A 10/25/2022   Procedure: LEFT HEART CATH AND CORONARY ANGIOGRAPHY;  Surgeon: Marykay Lex, MD;  Location: [facility] INVASIVE CV LAB;  Service: Cardiovascular;  Laterality: N/A;   SHOULDER ARTHROSCOPY Right 14    Allergies  Allergen Reactions   Hazelnut ([place]) Anaphylaxis   Norel Ad [Chlorphen-Pe-Acetaminophen] Anaphylaxis   Penicillins Anaphylaxis   Sulfa Antibiotics Hives   Neosporin [Neomycin-Bacitracin Zn-Polymyx] Rash and Other (See Comments)    Redness around application site    Review of Systems: Negative except as noted in the HPI.  Objective:  Osmond Steckman is a pleasant 68 y.o. male in NAD. AAO x 3.  Vascular Examination: Capillary refill time is 3-5 seconds to toes bilateral. Palpable pedal pulses b/l LE. Digital hair present b/l.  Skin temperature gradient WNL b/l. No varicosities b/l. No cyanosis noted b/l.   Dermatological Examination: Pedal skin with normal turgor, texture and tone b/l. No open wounds. No interdigital macerations b/l. Toenails x10 are 3mm thick, discolored, dystrophic with subungual debris. There is pain with compression of the nail plates.  They are elongated x10     Latest Ref Rng & Units 10/23/2022    8:43 AM  Hemoglobin A1C  Hemoglobin-A1c 4.8 - 5.6 % 6.7  Assessment/Plan: 1. Pain due to onychomycosis of toenails of both feet    The mycotic toenails were sharply debrided x10 with sterile nail nippers and a power debriding burr to decrease bulk/thickness and length.    Return in about 3 months (around 07/07/2023) for [facility].   Clerance Lav, DPM, FACFAS [facility]     [address] [place], [place] [address]                Office [phone]  Fax [phone]

## 2023-04-14 ENCOUNTER — Other Ambulatory Visit (HOSPITAL_COMMUNITY): Payer: Self-pay

## 2023-05-31 ENCOUNTER — Telehealth: Payer: Self-pay

## 2023-05-31 DIAGNOSIS — J449 Chronic obstructive pulmonary disease, unspecified: Secondary | ICD-10-CM | POA: Diagnosis not present

## 2023-05-31 DIAGNOSIS — Z8673 Personal history of transient ischemic attack (TIA), and cerebral infarction without residual deficits: Secondary | ICD-10-CM | POA: Diagnosis not present

## 2023-05-31 DIAGNOSIS — Z7902 Long term (current) use of antithrombotics/antiplatelets: Secondary | ICD-10-CM | POA: Diagnosis not present

## 2023-05-31 DIAGNOSIS — R1310 Dysphagia, unspecified: Secondary | ICD-10-CM | POA: Diagnosis not present

## 2023-05-31 DIAGNOSIS — K219 Gastro-esophageal reflux disease without esophagitis: Secondary | ICD-10-CM | POA: Diagnosis not present

## 2023-05-31 DIAGNOSIS — Z955 Presence of coronary angioplasty implant and graft: Secondary | ICD-10-CM | POA: Diagnosis not present

## 2023-05-31 DIAGNOSIS — I509 Heart failure, unspecified: Secondary | ICD-10-CM | POA: Diagnosis not present

## 2023-05-31 NOTE — Telephone Encounter (Signed)
   Pre-operative Risk Assessment    Patient Name: Scott Koch  DOB: 07-16-1955 MRN: [id number]   Date of last office visit: 11/02/2022 Date of next office visit: 08/05/2023   Request for Surgical Clearance {Procedure:   EGD and Colonoscopy  Date of Surgery:  Clearance TBD                              {Surgeon:  Dr. Emeterio Hansen [place] Surgeon's Group or Practice Name:  [facility] Phone number:  [phone] Fax number:  [phone]   Type of Clearance Requested:  -Medical: Please indicate if the patient is cleared from a cardiac standpoint  - Pharmacy:  Hold Aspirin  and Ticagrelor  (Brilinta ) 5 days prior   Type of Anesthesia:  Not Indicated   Additional requests/questions:  Please fax a copy of [phone] to the surgeon's office.  Signed, Lovis More M Biviana Saddler   05/31/2023, 4:16 PM

## 2023-06-01 NOTE — Telephone Encounter (Signed)
   Name:  Scott Koch  DOB:  05-Apr-1955  MRN:  [id number]   Primary Cardiologist: Zoe Hinds, MD  Chart reviewed as part of pre-operative protocol coverage.   Patient noted to have history of coronary artery disease with drug-eluting stent to the right coronary artery on 10/23/2022 in setting of acute coronary syndrome.  Patient had PCI and complex stenting of the RCA.  Patient has been instructed to continue on uninterrupted dual antiplatelet therapy of aspirin  and Brilinta  for a total of 1 year.  Recommend that endoscopy and colonoscopy be delayed until at least October 2025 to allow for completion of one year of DAPT. Please resubmit request for preoperative risk assessment at that time. If endoscopy and colonoscopy are urgently needed please notify the office as further recommendations from his cardiologist will be needed.   Scott Hair D Jan Walters, NP 06/01/2023, 9:43 AM

## 2023-07-12 DIAGNOSIS — Z7984 Long term (current) use of oral hypoglycemic drugs: Secondary | ICD-10-CM | POA: Diagnosis not present

## 2023-07-12 DIAGNOSIS — H25813 Combined forms of age-related cataract, bilateral: Secondary | ICD-10-CM | POA: Diagnosis not present

## 2023-07-12 DIAGNOSIS — H5203 Hypermetropia, bilateral: Secondary | ICD-10-CM | POA: Diagnosis not present

## 2023-07-12 DIAGNOSIS — Z794 Long term (current) use of insulin: Secondary | ICD-10-CM | POA: Diagnosis not present

## 2023-07-12 DIAGNOSIS — H52223 Regular astigmatism, bilateral: Secondary | ICD-10-CM | POA: Diagnosis not present

## 2023-07-12 DIAGNOSIS — H524 Presbyopia: Secondary | ICD-10-CM | POA: Diagnosis not present

## 2023-07-12 DIAGNOSIS — E119 Type 2 diabetes mellitus without complications: Secondary | ICD-10-CM | POA: Diagnosis not present

## 2023-07-13 ENCOUNTER — Ambulatory Visit: Admitting: Podiatry

## 2023-07-13 DIAGNOSIS — M79675 Pain in left toe(s): Secondary | ICD-10-CM

## 2023-07-13 DIAGNOSIS — M79674 Pain in right toe(s): Secondary | ICD-10-CM

## 2023-07-13 DIAGNOSIS — B351 Tinea unguium: Secondary | ICD-10-CM

## 2023-07-13 NOTE — Progress Notes (Signed)
 Subjective:  Patient ID: Scott Koch, male    DOB: Feb 07, 1955,  MRN: [id number]   Scott Koch presents to clinic today for:  Chief Complaint  Patient presents with   [facility]    [facility] with out callous.  A1c 6.11 in April, Brialinta and ASA    Patient notes nails are thick, discolored, elongated and painful in shoegear when trying to ambulate.    PCP is [name], Lindsay Rho, NP.  Past Medical History:  Diagnosis Date   Asthma    inhaler used only when seasonal allergies    Chronic diastolic heart failure (HCC) 10/24/2022   Complication of anesthesia    patient statesgets rowdy when wake up   COPD (chronic obstructive pulmonary disease) (HCC)    Diabetes mellitus without complication (HCC)    fasting blood sugar avg 120   Heart murmur    HNP (herniated nucleus pulposus), lumbar 01/07/2014   Hypertension    Hypothyroidism    NSTEMI (non-ST elevated myocardial infarction) (HCC) 10/23/2022   Pneumonia    hx   Protein in urine    Shortness of breath dyspnea    hx   Type 2 diabetes mellitus with complication, with long-term current use of insulin  (HCC) 10/23/2022    Past Surgical History:  Procedure Laterality Date   ANTERIOR CERVICAL DECOMP/DISCECTOMY FUSION  01/13/2012   Procedure: ANTERIOR CERVICAL DECOMPRESSION/DISCECTOMY FUSION 3 LEVELS;  Surgeon: Ferris Hua, MD;  Location: [facility];  Service: Neurosurgery;  Laterality: N/A;  Cervical three-four, cervical four five, cervical six-seven anterior cervical decompression fusion with plate    CORONARY ANGIOPLASTY  2000   stent   CORONARY LITHOTRIPSY N/A 10/25/2022   Procedure: CORONARY LITHOTRIPSY;  Surgeon: Arleen Lacer, MD;  Location: [facility] INVASIVE CV LAB;  Service: Cardiovascular;  Laterality: N/A;   CORONARY STENT INTERVENTION N/A 10/25/2022   Procedure: CORONARY STENT INTERVENTION;  Surgeon: Arleen Lacer, MD;  Location: [facility] INVASIVE CV LAB;  Service: Cardiovascular;  Laterality: N/A;   LEFT HEART CATH AND CORONARY  ANGIOGRAPHY N/A 10/25/2022   Procedure: LEFT HEART CATH AND CORONARY ANGIOGRAPHY;  Surgeon: Arleen Lacer, MD;  Location: [facility] INVASIVE CV LAB;  Service: Cardiovascular;  Laterality: N/A;   SHOULDER ARTHROSCOPY Right 14    Allergies  Allergen Reactions   Hazelnut (Filbert) Anaphylaxis   Norel Ad [Chlorphen-Pe-Acetaminophen ] Anaphylaxis   Penicillins Anaphylaxis   Sulfa Antibiotics Hives   Neosporin [Neomycin-Bacitracin  Zn-Polymyx] Rash and Other (See Comments)    Redness around application site    Review of Systems: Negative except as noted in the HPI.  Objective:  Scott Koch is a pleasant 68 y.o. male in NAD. AAO x 3.  Vascular Examination: Capillary refill time is 3-5 seconds to toes bilateral. Palpable pedal pulses b/l LE. Digital hair present b/l.  Skin temperature gradient WNL b/l. No varicosities b/l. No cyanosis noted b/l.   Dermatological Examination: Pedal skin with normal turgor, texture and tone b/l. No open wounds. No interdigital macerations b/l. Toenails x10 are 3mm thick, discolored, dystrophic with subungual debris. There is pain with compression of the nail plates.  They are elongated x10     Latest Ref Rng & Units 10/23/2022    8:43 AM  Hemoglobin A1C  Hemoglobin-A1c 4.8 - 5.6 % 6.7    Assessment/Plan: 1. Pain due to onychomycosis of toenails of both feet    The mycotic toenails were sharply debrided x10 with sterile nail nippers and a power debriding burr to decrease bulk/thickness  and length.    Return in about 3 months (around 10/13/2023) for [facility].   Joe Murders, DPM, FACFAS [facility]     [address] [place], [place] [address]                Office [phone]  Fax [phone]

## 2023-07-19 DIAGNOSIS — E1121 Type 2 diabetes mellitus with diabetic nephropathy: Secondary | ICD-10-CM | POA: Diagnosis not present

## 2023-07-19 DIAGNOSIS — J449 Chronic obstructive pulmonary disease, unspecified: Secondary | ICD-10-CM | POA: Diagnosis not present

## 2023-07-19 DIAGNOSIS — E785 Hyperlipidemia, unspecified: Secondary | ICD-10-CM | POA: Diagnosis not present

## 2023-07-19 DIAGNOSIS — I50810 Right heart failure, unspecified: Secondary | ICD-10-CM | POA: Diagnosis not present

## 2023-07-19 DIAGNOSIS — E034 Atrophy of thyroid (acquired): Secondary | ICD-10-CM | POA: Diagnosis not present

## 2023-07-19 DIAGNOSIS — E1169 Type 2 diabetes mellitus with other specified complication: Secondary | ICD-10-CM | POA: Diagnosis not present

## 2023-07-19 DIAGNOSIS — Z6828 Body mass index (BMI) 28.0-28.9, adult: Secondary | ICD-10-CM | POA: Diagnosis not present

## 2023-07-19 DIAGNOSIS — E782 Mixed hyperlipidemia: Secondary | ICD-10-CM | POA: Diagnosis not present

## 2023-07-19 DIAGNOSIS — K219 Gastro-esophageal reflux disease without esophagitis: Secondary | ICD-10-CM | POA: Diagnosis not present

## 2023-07-19 DIAGNOSIS — E559 Vitamin D deficiency, unspecified: Secondary | ICD-10-CM | POA: Diagnosis not present

## 2023-07-19 DIAGNOSIS — Z125 Encounter for screening for malignant neoplasm of prostate: Secondary | ICD-10-CM | POA: Diagnosis not present

## 2023-08-04 ENCOUNTER — Other Ambulatory Visit: Payer: Self-pay

## 2023-08-04 NOTE — Progress Notes (Signed)
 Cardiology Office Note:  .   Date:  08/05/2023  ID:  Scott Koch, DOB 08/30/1955, MRN [id number] PCP: Pandora Therisa RAMAN, NP  [facility] Providers Cardiologist:  Redell Leiter, MD Cardiology APP:  Carlin Delon BROCKS, NP    History of Present Illness: .   Scott Koch is a 68 y.o. male with a past medical history of hypertension, CAD s/p DES to RCA 2024 non-STEMI, diastolic heart failure, COPD, DM 2, hypothyroidism, tobacco use.  10/23/2022 echo EF 55 to 60%, no valvular abnormalities 10/25/2022 left heart cath severe single-vessel CAD s/p PCI of RCA with DES x 1 07/28/2022 Myoview  low risk, normal study  Admitted 10/22/2022 for non-STEMI, underwent left heart cath revealing single-vessel sev ere CAD s/p shockwave lithotripsy and DES PCI of RCA.  Recommended DAPT with aspirin  and Brilinta  for 12 months, after that DC aspirin  and continue Brilinta  or Plavix .  His Crestor  was increased as his LPA was elevated at 289.  Most recently was evaluated by Dr. Leiter on 01/28/2023, stable from a cardiac perspective, he was continuing to participate in cardiac rehabilitation, plans to follow up in 6 months.   He presents today accompanied by his wife for follow-up of his CAD.  He has been doing well since last evaluated in our office.  He did have some episodes of atypical chest pain over the weekend, sharp and fleeting in nature.  He is trying to be more physically active, walks on occasion.  He is smoking half pack per day however has a quit date in mind and plans to do this with the help of nicotine  patches.  He is bothered by some nausea in the morning that typically subsides around 10 AM, thinks this is related to the medications he takes at night. He denies chest pain, palpitations, dyspnea, pnd, orthopnea, dizziness, syncope, edema, weight gain, or early satiety.    ROS: Review of Systems  All other systems reviewed and are negative.    Studies Reviewed: .        Cardiac Studies & Procedures    ______________________________________________________________________________________________ CARDIAC CATHETERIZATION  CARDIAC CATHETERIZATION 10/25/2022  Conclusion   Culprit Lesion Segment: prox RCA-1 lesion is 60% stenosed. Mid RCA lesion is 85% stenosed.  Prox RCA-2 lesion is 80% stenosed.   Following Balloon PTCA and Shockwave Lithotripsy, a drug-eluting stent was successfully placed covering all 3 lesions of culprit segment, using a SYNERGY XD 3.0X38 => postdilated to 3.6 mm   Post intervention, there is a 0% residual stenosis in the distal 2 lesions with 10% residual stenosis in the heavily calcified eccentric 60% segment   -------------------------------------------------------------------------   Mid LM to Prox LAD lesion is 25% stenosed.   -------------------------------------------------------------------------   The left ventricular systolic function is normal, by echocardiogram; LV end diastolic pressure is normal.   ==========================================  POST-CATH FINDINGS Severe single-vessel CAD with heavily calcified eccentric proximal RCA 60% tapering to a 80% shelflike lesion followed by a napkin ring 85% stenosis Successful Shockwave Lithotripsy based DES PCI of the RCA (Synergy XD 3.0 mm x 38 mm postdilated to 3.6 mm), reducing stenoses to a few focal areas of 10% but otherwise 0% with TIMI-3 flow restored Otherwise moderate calcification in the distal LM-proximal LAD and LCx but at most 30% proximal LAD stenosis. Normal LVEDP (preserved EF by echo)   RECOMMENDATIONS   Continue titrate aggressive GDMT for CAD   Recommend uninterrupted dual antiplatelet therapy with Aspirin  81mg  daily and Ticagrelor  90mg  twice daily for a minimum of 12 months (  ACS-Class I recommendation).   After 1 year, okay to DC ASA and continue with SAPT either with maintenance dose ticagrelor  60 mg twice daily or converting to complete a grill 75 mg daily.   In the absence of any other  complications or medical issues, we expect the patient to be ready for discharge from an interventional cardiology perspective on 10/25/2022.    Alm Clay, MD  Findings Coronary Findings Diagnostic  Dominance: Right  Left Main Vessel was injected. Vessel is normal in caliber. Mid LM to Prox LAD lesion is 25% stenosed.  Left Anterior Descending Vessel is moderate in size. The vessel exhibits minimal luminal irregularities.  First Diagonal Branch Vessels moderate in size  Second Diagonal Branch Vessel is small in size.  Left Circumflex Vessel is small.  First Obtuse Marginal Branch Vessel is small in size. The vessel exhibits minimal luminal irregularities.  Right Coronary Artery Vessel was injected. Vessel is large. There is severe focal disease in the vessel. The vessel is severely calcified. Prox RCA-1 lesion is 60% stenosed. The lesion is segmental and eccentric. The lesion is severely calcified. Prox RCA-2 lesion is 80% stenosed. The lesion is eccentric, irregular and ulcerative. The lesion is severely calcified. Shelflike lesion Mid RCA lesion is 85% stenosed. The lesion is focal and concentric. The lesion is moderately calcified.  Acute Marginal Branch Vessel is small in size.  Right Ventricular Branch Vessel is small in size.  Right Posterior Descending Artery Vessel is small in size.  Right Posterior Atrioventricular Artery Vessel is moderate in size.  Second Right Posterolateral Branch Vessel is small in size.  Intervention  Prox RCA-1 lesion Angioplasty Lesion length:  16 mm. CATH SHOCKWAVE C2 3.5X12 guide catheter was inserted. WIRE ASAHI PROWATER 180CM guidewire used to cross lesion. Balloon angioplasty was performed. Maximum pressure: 4 atm. Inflation time: 10 sec. A total of 4 bursts of 10 pulses Stent (Also treats lesions: Prox RCA-2, and Mid RCA) Lesion length:  36 mm. CATH VISTA GUIDE 6FR JR4 guide catheter was inserted. Lesion crossed with  guidewire using a WIRE ASAHI PROWATER 180CM. Pre-stent angioplasty was performed using a BALLN SAPPHIRE 3.25X20. Maximum pressure:  12 atm. Inflation time:  20 sec. BALLN SAPPHIRE 2.5X20 -> 12 ATM x 20 sec A drug-eluting stent was successfully placed using a SYNERGY XD 3.0X38. Following the 3.0 mm balloon angioplasty, 3.5 mm shockwave balloon used throughout the target segment; stent covers all 3 portions of the diseased segment Maximum pressure: 18 atm. Inflation time: 30 sec. Stent strut is well apposed. Postdilated to 3.6 mm Post-stent angioplasty was performed using a BALL SAPPHIRE NC24 3.5X18. Maximum pressure:  16 atm. Inflation time:  20 sec. Also 3.5 x 8 [place] balloon used-18 ATM x 20 seconds throughout the entire stent Post-Intervention Lesion Assessment The intervention was successful. Pre-interventional TIMI flow is 3. Post-intervention TIMI flow is 3. Treated lesion length:  38 mm. No complications occurred at this lesion. There is a 10% residual stenosis post intervention.  Prox RCA-2 lesion Angioplasty Lesion length:  8 mm. CATH SHOCKWAVE C2 3.5X12 guide catheter was inserted. WIRE ASAHI PROWATER 180CM guidewire used to cross lesion. Balloon angioplasty was performed. Stent (Also treats lesions: Prox RCA-1, and Mid RCA) See details in Prox RCA-1 lesion. Post-Intervention Lesion Assessment The intervention was successful. Pre-interventional TIMI flow is 3. Post-intervention TIMI flow is 3. Treated lesion length:  10 mm. No complications occurred at this lesion. There is a 0% residual stenosis post intervention.  Mid RCA lesion Angioplasty Lesion  length:  8 mm. CATH SHOCKWAVE C2 3.5X12 guide catheter was inserted. WIRE ASAHI PROWATER 180CM guidewire used to cross lesion. Balloon angioplasty was performed. Lithotripsy balloon Stent (Also treats lesions: Prox RCA-1, and Prox RCA-2) See details in Prox RCA-1 lesion. Post-Intervention Lesion Assessment The intervention was successful.  Pre-interventional TIMI flow is 3. Post-intervention TIMI flow is 3. There is a 0% residual stenosis post intervention.   STRESS TESTS  MYOCARDIAL PERFUSION IMAGING 07/28/2022  Interpretation Summary   Findings are consistent with no ischemia and no infarction. The study is low risk.   No ST deviation was noted.   Left ventricular function is normal. End diastolic cavity size is normal.   Prior study available for comparison from 01/15/2021.   ECHOCARDIOGRAM  ECHOCARDIOGRAM COMPLETE 10/23/2022  Narrative ECHOCARDIOGRAM REPORT    Patient Name:   Davison Arey Date of Exam: 10/23/2022 Medical Rec #:  [id number]    Height:       72.0 in Accession #:    [id number]   Weight:       213.0 lb Date of Birth:  06/19/1955    BSA:          2.188 m Patient Age:    67 years     BP:           116/71 mmHg Patient Gender: M            HR:           63 bpm. Exam Location:  Inpatient  Procedure: 2D Echo, Color Doppler, Cardiac Doppler and Strain Analysis  Indications:    NSTEMI I21.4  History:        Patient has prior history of Echocardiogram examinations. Signs/Symptoms:Murmur; Risk Factors:Hypertension.  Sonographer:    Bari Roar Referring Phys: [id number] [facility] H HENDERSON   Sonographer Comments: Global longitudinal strain was attempted. IMPRESSIONS   1. Left ventricular ejection fraction, by estimation, is 55 to 60%. The left ventricle has normal function. The left ventricle has no regional wall motion abnormalities. Left ventricular diastolic parameters were normal. The average left ventricular global longitudinal strain is -19.0 %. The global longitudinal strain is normal. 2. Right ventricular systolic function is normal. The right ventricular size is normal. There is normal pulmonary artery systolic pressure. 3. The mitral valve is normal in structure. No evidence of mitral valve regurgitation. 4. The aortic valve is normal in structure. Aortic valve regurgitation is not  visualized. 5. The inferior vena cava is normal in size with greater than 50% respiratory variability, suggesting right atrial pressure of 3 mmHg.  Conclusion(s)/Recommendation(s): Normal biventricular function without evidence of hemodynamically significant valvular heart disease.  FINDINGS Left Ventricle: Left ventricular ejection fraction, by estimation, is 55 to 60%. The left ventricle has normal function. The left ventricle has no regional wall motion abnormalities. The average left ventricular global longitudinal strain is -19.0 %. The global longitudinal strain is normal. The left ventricular internal cavity size was normal in size. There is no left ventricular hypertrophy. Left ventricular diastolic parameters were normal.  Right Ventricle: The right ventricular size is normal. Right ventricular systolic function is normal. There is normal pulmonary artery systolic pressure. The tricuspid regurgitant velocity is 2.35 m/s, and with an assumed right atrial pressure of 3 mmHg, the estimated right ventricular systolic pressure is 25.1 mmHg.  Left Atrium: Left atrial size was normal in size.  Right Atrium: Right atrial size was normal in size.  Pericardium: There is no evidence of pericardial effusion.  Mitral Valve: The  mitral valve is normal in structure. No evidence of mitral valve regurgitation. MV peak gradient, 2.9 mmHg. The mean mitral valve gradient is 2.0 mmHg.  Tricuspid Valve: Tricuspid valve regurgitation is mild.  Aortic Valve: The aortic valve is normal in structure. Aortic valve regurgitation is not visualized. Aortic valve mean gradient measures 2.0 mmHg. Aortic valve peak gradient measures 4.2 mmHg. Aortic valve area, by VTI measures 3.29 cm.  Pulmonic Valve: Pulmonic valve regurgitation is not visualized.  Aorta: The aortic root and ascending aorta are structurally normal, with no evidence of dilitation.  Venous: The inferior vena cava is normal in size with greater  than 50% respiratory variability, suggesting right atrial pressure of 3 mmHg.  IAS/Shunts: No atrial level shunt detected by color flow Doppler.   LEFT VENTRICLE PLAX 2D LVIDd:         4.40 cm   Diastology LVIDs:         3.10 cm   LV e' medial:    9.57 cm/s LV PW:         0.90 cm   LV E/e' medial:  9.5 LV IVS:        1.00 cm   LV e' lateral:   13.90 cm/s LVOT diam:     2.10 cm   LV E/e' lateral: 6.5 LV SV:         61 LV SV Index:   28        2D Longitudinal Strain LVOT Area:     3.46 cm  2D Strain GLS Avg:     -19.0 %   RIGHT VENTRICLE RV Basal diam:  3.30 cm RV Mid diam:    2.80 cm RV S prime:     15.10 cm/s TAPSE (M-mode): 2.2 cm  LEFT ATRIUM             Index        RIGHT ATRIUM           Index LA diam:        3.40 cm 1.55 cm/m   RA Area:     14.70 cm LA Vol (A2C):   45.7 ml 20.88 ml/m  RA Volume:   34.60 ml  15.81 ml/m LA Vol (A4C):   31.5 ml 14.40 ml/m LA Biplane Vol: 39.6 ml 18.10 ml/m AORTIC VALVE                    PULMONIC VALVE AV Area (Vmax):    3.09 cm     PV Vmax:          1.01 m/s AV Area (Vmean):   3.02 cm     PV Peak grad:     4.1 mmHg AV Area (VTI):     3.29 cm     PR End Diast Vel: 3.76 msec AV Vmax:           102.00 cm/s  RVOT Peak grad:   2 mmHg AV Vmean:          69.600 cm/s AV VTI:            0.184 m AV Peak Grad:      4.2 mmHg AV Mean Grad:      2.0 mmHg LVOT Vmax:         91.00 cm/s LVOT Vmean:        60.700 cm/s LVOT VTI:          0.175 m LVOT/AV VTI ratio: 0.95  AORTA Ao Sinus diam: 2.90 cm  Ao STJ diam:   2.7 cm Ao Asc diam:   2.90 cm  MITRAL VALVE               TRICUSPID VALVE MV Area (PHT): 4.36 cm    TR Peak grad:   22.1 mmHg MV Area VTI:   2.55 cm    TR Vmax:        235.00 cm/s MV Peak grad:  2.9 mmHg MV Mean grad:  2.0 mmHg    SHUNTS MV Vmax:       0.85 m/s    Systemic VTI:  0.18 m MV Vmean:      60.2 cm/s   Systemic Diam: 2.10 cm MV Decel Time: 174 msec MV E velocity: 90.70 cm/s MV A velocity: 93.30 cm/s MV E/A  ratio:  0.97  Photographer signed by Ronal Ross Signature Date/Time: 10/23/2022/1:54:42 PM    Final          ______________________________________________________________________________________________      Risk Assessment/Calculations:             Physical Exam:   VS:  BP 126/74   Pulse 72   Ht 6' (1.829 m)   Wt 213 lb (96.6 kg)   SpO2 95%   BMI 28.89 kg/m    Wt Readings from Last 3 Encounters:  08/05/23 213 lb (96.6 kg)  01/28/23 211 lb 12.8 oz (96.1 kg)  11/02/22 209 lb (94.8 kg)    GEN: Well nourished, well developed in no acute distress NECK: No JVD; No carotid bruits CARDIAC: RRR, no murmurs, rubs, gallops RESPIRATORY:  Clear to auscultation without rales, wheezing or rhonchi  ABDOMEN: Soft, non-tender, non-distended EXTREMITIES:  No edema; No deformity   ASSESSMENT AND PLAN: .   CAD-s/p DES to RCA 10/25/2022. Stable with no anginal symptoms. No indication for ischemic evaluation.  Heart healthy diet and regular cardiovascular exercise encouraged.  Continue aspirin  81 mg daily, Brilinta  90 mg twice daily until 10/25/2023 which will complete 1 year of DAPT>> then discontinue aspirin  and Brilinta  continue monotherapy with Plavix  75 mg daily indefinitely.  Continue nitroglycerin  as needed--has not needed.  HFpEF-NYHA class I, euvolemic.  Continue losartan  25 mg daily, continue Demadex  20 mg daily.  Dyslipidemia/elevated LPa-most recent LDL was 69 however his LPA was elevated at 289.3, we will increase his Crestor  to 40 mg daily and repeat FLP and LFTs in 6 weeks.  He recently had blood work with his PCP on 07/24/2023 revealing normal LFTs.    Hypertension-blood pressure is well-controlled at 126/74, continue losartan  25 mg daily.  Tobacco abuse-he has a plan in place to stop smoking with a specific date.  Dispo: Increase Crestor  to 40 mg daily, repeat FLP and LFTs in 6 weeks, on 10/25/2023 he will stop Brilinta  and Aspirin  and on 10/26/2023 start  monotherapy with Plavix  75 mg daily. Follow up in 6 months with Dr. Monetta.   Signed, Delon JAYSON Hoover, NP

## 2023-08-05 ENCOUNTER — Encounter: Payer: Self-pay | Admitting: Cardiology

## 2023-08-05 ENCOUNTER — Ambulatory Visit: Admitting: Cardiology

## 2023-08-05 ENCOUNTER — Ambulatory Visit: Attending: Cardiology | Admitting: Cardiology

## 2023-08-05 VITALS — BP 126/74 | HR 72 | Ht 72.0 in | Wt 213.0 lb

## 2023-08-05 DIAGNOSIS — J449 Chronic obstructive pulmonary disease, unspecified: Secondary | ICD-10-CM | POA: Diagnosis not present

## 2023-08-05 DIAGNOSIS — E7841 Elevated Lipoprotein(a): Secondary | ICD-10-CM | POA: Diagnosis not present

## 2023-08-05 DIAGNOSIS — E782 Mixed hyperlipidemia: Secondary | ICD-10-CM | POA: Diagnosis not present

## 2023-08-05 DIAGNOSIS — I5032 Chronic diastolic (congestive) heart failure: Secondary | ICD-10-CM | POA: Diagnosis not present

## 2023-08-05 DIAGNOSIS — I25118 Atherosclerotic heart disease of native coronary artery with other forms of angina pectoris: Secondary | ICD-10-CM

## 2023-08-05 DIAGNOSIS — I10 Essential (primary) hypertension: Secondary | ICD-10-CM

## 2023-08-05 MED ORDER — CLOPIDOGREL BISULFATE 75 MG PO TABS: 75.0000 mg | ORAL_TABLET | Freq: Every day | ORAL | 3 refills | Status: AC

## 2023-08-05 MED ORDER — ROSUVASTATIN CALCIUM 40 MG PO TABS: 40.0000 mg | ORAL_TABLET | Freq: Every day | ORAL | 3 refills | Status: AC

## 2023-08-05 NOTE — Patient Instructions (Addendum)
 Medication Instructions:  Your physician has recommended you make the following change in your medication:   START: Plavix  75 mg daily (Start on 10/1) START: Crestor  40 mg daily STOP: Brilinta  (Stop on 9/30) STOP: Aspirin  (Stop on 9/30)  *If you need a refill on your cardiac medications before your next appointment, please call your pharmacy*  Lab Work: Your physician recommends that you return for lab work in:   Labs in 6 weeks: Lipids, CMP  If you have labs (blood work) drawn today and your tests are completely normal, you will receive your results only by: MyChart Message (if you have MyChart) OR A paper copy in the mail If you have any lab test that is abnormal or we need to change your treatment, we will call you to review the results.  Testing/Procedures: None  Follow-Up: At [facility], you and your health needs are our priority.  As part of our continuing mission to provide you with exceptional heart care, our providers are all part of one team.  This team includes your primary Cardiologist (physician) and Advanced Practice Providers or APPs (Physician Assistants and Nurse Practitioners) who all work together to provide you with the care you need, when you need it.  Your next appointment:   6 month(s)  Provider:   Redell Leiter, MD    We recommend signing up for the patient portal called MyChart.  Sign up information is provided on this After Visit Summary.  MyChart is used to connect with patients for Virtual Visits (Telemedicine).  Patients are able to view lab/test results, encounter notes, upcoming appointments, etc.  Non-urgent messages can be sent to your provider as well.   To learn more about what you can do with MyChart, go to [email or web address].   Other Instructions None

## 2023-08-25 DIAGNOSIS — E1169 Type 2 diabetes mellitus with other specified complication: Secondary | ICD-10-CM | POA: Diagnosis not present

## 2023-08-25 DIAGNOSIS — E782 Mixed hyperlipidemia: Secondary | ICD-10-CM | POA: Diagnosis not present

## 2023-09-16 DIAGNOSIS — I10 Essential (primary) hypertension: Secondary | ICD-10-CM | POA: Diagnosis not present

## 2023-09-16 DIAGNOSIS — E782 Mixed hyperlipidemia: Secondary | ICD-10-CM | POA: Diagnosis not present

## 2023-09-16 DIAGNOSIS — I25118 Atherosclerotic heart disease of native coronary artery with other forms of angina pectoris: Secondary | ICD-10-CM | POA: Diagnosis not present

## 2023-09-16 DIAGNOSIS — E7841 Elevated Lipoprotein(a): Secondary | ICD-10-CM | POA: Diagnosis not present

## 2023-09-16 DIAGNOSIS — J449 Chronic obstructive pulmonary disease, unspecified: Secondary | ICD-10-CM | POA: Diagnosis not present

## 2023-09-16 DIAGNOSIS — I5032 Chronic diastolic (congestive) heart failure: Secondary | ICD-10-CM | POA: Diagnosis not present

## 2023-09-17 LAB — COMPREHENSIVE METABOLIC PANEL WITH GFR
ALT: 11 IU/L (ref 0–44)
AST: 16 IU/L (ref 0–40)
Albumin: 4 g/dL (ref 3.9–4.9)
Alkaline Phosphatase: 81 IU/L (ref 44–121)
BUN/Creatinine Ratio: 20 (ref 10–24)
BUN: 19 mg/dL (ref 8–27)
Bilirubin Total: 0.4 mg/dL (ref 0.0–1.2)
CO2: 20 mmol/L (ref 20–29)
Calcium: 9.3 mg/dL (ref 8.6–10.2)
Chloride: 104 mmol/L (ref 96–106)
Creatinine, Ser: 0.97 mg/dL (ref 0.76–1.27)
Globulin, Total: 2.5 g/dL (ref 1.5–4.5)
Glucose: 118 mg/dL — ABNORMAL HIGH (ref 70–99)
Potassium: 4.4 mmol/L (ref 3.5–5.2)
Sodium: 139 mmol/L (ref 134–144)
Total Protein: 6.5 g/dL (ref 6.0–8.5)
eGFR: 85 mL/min/1.73

## 2023-09-17 LAB — LIPID PANEL
Chol/HDL Ratio: 3.8 ratio (ref 0.0–5.0)
Cholesterol, Total: 113 mg/dL (ref 100–199)
HDL: 30 mg/dL — ABNORMAL LOW
LDL Chol Calc (NIH): 54 mg/dL (ref 0–99)
Triglycerides: 168 mg/dL — ABNORMAL HIGH (ref 0–149)
VLDL Cholesterol Cal: 29 mg/dL (ref 5–40)

## 2023-09-20 ENCOUNTER — Telehealth: Payer: Self-pay

## 2023-09-20 ENCOUNTER — Ambulatory Visit: Payer: Self-pay | Admitting: Cardiology

## 2023-09-20 NOTE — Telephone Encounter (Signed)
 Left message on My Chart with lab results per Dr. Karry note. Routed to PCP

## 2023-09-25 DIAGNOSIS — E1169 Type 2 diabetes mellitus with other specified complication: Secondary | ICD-10-CM | POA: Diagnosis not present

## 2023-09-25 DIAGNOSIS — E782 Mixed hyperlipidemia: Secondary | ICD-10-CM | POA: Diagnosis not present

## 2023-10-12 ENCOUNTER — Ambulatory Visit: Admitting: Podiatry

## 2023-10-12 DIAGNOSIS — M79674 Pain in right toe(s): Secondary | ICD-10-CM | POA: Diagnosis not present

## 2023-10-12 DIAGNOSIS — B351 Tinea unguium: Secondary | ICD-10-CM

## 2023-10-12 DIAGNOSIS — M79675 Pain in left toe(s): Secondary | ICD-10-CM | POA: Diagnosis not present

## 2023-10-12 NOTE — Progress Notes (Unsigned)
 Subjective:  Patient ID: Scott Koch, male    DOB: 11-22-1955,  MRN: [id number]   Scott Koch presents to clinic today for:  Chief Complaint  Patient presents with   [facility]    [facility] no calluses A1c 6.7  Asprin 81 mg Brilinta    Patient notes nails are thick, discolored, elongated and painful in shoegear when trying to ambulate.    PCP is [name], Therisa RAMAN, NP.  Past Medical History:  Diagnosis Date   Asthma    inhaler used only when seasonal allergies    Chronic diastolic heart failure (HCC) 10/24/2022   Complication of anesthesia    patient statesgets rowdy when wake up   COPD (chronic obstructive pulmonary disease) (HCC)    COPD (chronic obstructive pulmonary disease) (HCC) 10/22/2020   Diabetes mellitus without complication (HCC)    fasting blood sugar avg 120   Heart murmur    HNP (herniated nucleus pulposus), lumbar 01/07/2014   Hypertension    Hypothyroidism    NSTEMI (non-ST elevated myocardial infarction) (HCC) 10/23/2022   Pneumonia    hx   Protein in urine    Type 2 diabetes mellitus with complication, with long-term current use of insulin  (HCC) 10/23/2022    Past Surgical History:  Procedure Laterality Date   ANTERIOR CERVICAL DECOMP/DISCECTOMY FUSION  01/13/2012   Procedure: ANTERIOR CERVICAL DECOMPRESSION/DISCECTOMY FUSION 3 LEVELS;  Surgeon: Arley SHAUNNA Helling, MD;  Location: [facility];  Service: Neurosurgery;  Laterality: N/A;  Cervical three-four, cervical four five, cervical six-seven anterior cervical decompression fusion with plate    CORONARY ANGIOPLASTY  2000   stent   CORONARY LITHOTRIPSY N/A 10/25/2022   Procedure: CORONARY LITHOTRIPSY;  Surgeon: Anner Alm ORN, MD;  Location: [facility] INVASIVE CV LAB;  Service: Cardiovascular;  Laterality: N/A;   CORONARY STENT INTERVENTION N/A 10/25/2022   Procedure: CORONARY STENT INTERVENTION;  Surgeon: Anner Alm ORN, MD;  Location: [facility] INVASIVE CV LAB;  Service: Cardiovascular;  Laterality: N/A;   LEFT HEART CATH  AND CORONARY ANGIOGRAPHY N/A 10/25/2022   Procedure: LEFT HEART CATH AND CORONARY ANGIOGRAPHY;  Surgeon: Anner Alm ORN, MD;  Location: [facility] INVASIVE CV LAB;  Service: Cardiovascular;  Laterality: N/A;   SHOULDER ARTHROSCOPY Right 14    Allergies  Allergen Reactions   Hazelnut (Filbert) Anaphylaxis   Norel Ad [Chlorphen-Pe-Acetaminophen ] Anaphylaxis   Penicillins Anaphylaxis   Sulfa Antibiotics Hives   Neosporin [Neomycin-Bacitracin  Zn-Polymyx] Rash and Other (See Comments)    Redness around application site    Review of Systems: Negative except as noted in the HPI.  Objective:  Scott Koch is a pleasant 68 y.o. male in NAD. AAO x 3.  Vascular Examination: Capillary refill time is 3-5 seconds to toes bilateral. Palpable pedal pulses b/l LE. Digital hair present b/l.  Skin temperature gradient WNL b/l. No varicosities b/l. No cyanosis noted b/l.   Dermatological Examination: Pedal skin with normal turgor, texture and tone b/l. No open wounds. No interdigital macerations b/l. Toenails x10 are 3mm thick, discolored, dystrophic with subungual debris. There is pain with compression of the nail plates.  They are elongated x10     Latest Ref Rng & Units 10/23/2022    8:43 AM  Hemoglobin A1C  Hemoglobin-A1c 4.8 - 5.6 % 6.7    Assessment/Plan: 1. Pain due to onychomycosis of toenails of both feet    The mycotic toenails were sharply debrided x10 with sterile nail nippers and a power debriding burr to decrease bulk/thickness and length.  Return in about 3 months (around 01/11/2024) for [facility].   Awanda CHARM Imperial, DPM, FACFAS [facility]     [address] [place], [place] [address]                Office [phone]  Fax [phone]

## 2023-10-25 DIAGNOSIS — E1169 Type 2 diabetes mellitus with other specified complication: Secondary | ICD-10-CM | POA: Diagnosis not present

## 2023-10-25 DIAGNOSIS — E782 Mixed hyperlipidemia: Secondary | ICD-10-CM | POA: Diagnosis not present

## 2023-10-31 ENCOUNTER — Telehealth: Payer: Self-pay | Admitting: *Deleted

## 2023-10-31 DIAGNOSIS — K219 Gastro-esophageal reflux disease without esophagitis: Secondary | ICD-10-CM | POA: Diagnosis not present

## 2023-10-31 NOTE — Telephone Encounter (Signed)
 I apologize Dr. Joice, but there is a typo in your response.   Just to clarify, patient is okay to hold Plavix ?   Thank you!  Barnie HERO. Jolie Strohecker, DNP, NP-C  10/31/2023, 2:01 PM [facility] [address] Office [phone] Fax [phone]

## 2023-10-31 NOTE — Telephone Encounter (Signed)
 Dr. Monetta,   This patient underwent PCI with a long DES 3.0 x 38 mm to RCA on 10/25/2022. Per office protocol, will you please provide recommendations for holding Plavix  prior to colonoscopy/EGD dilation?  Please route your response to P CV DIV Preop. I will communicate with requesting office once you have given recommendations.   Thank you!  Barnie Hila, NP

## 2023-10-31 NOTE — Telephone Encounter (Signed)
   Pre-operative Risk Assessment    Patient Name: Scott Koch  DOB: 04/12/1955 MRN: [id number]      Request for Surgical Clearance    Procedure:  Colonscopy EGD Dilation  Date of Surgery:  Clearance TBD                                 Surgeon:  Dr. Evalene ALF [place] Surgeon's Group or Practice Name:  [facility] Phone number:  [phone] Fax number:  [phone]   Type of Clearance Requested:   - Pharmacy:  Hold Clopidogrel  (Plavix ) Is it okay to stop   Type of Anesthesia:  Not Indicated   Additional requests/questions:    Bonney Arloa Donovan Levorn   10/31/2023, 11:46 AM

## 2023-10-31 NOTE — Telephone Encounter (Signed)
   Name: Scott Koch  DOB: 06-30-1955  MRN: [id number]   Primary Cardiologist: Redell Leiter, MD  Chart reviewed as part of pre-operative protocol coverage.   Per Dr. Leiter, he may hold Plavix  for 5 days prior to procedure and should resume as soon as hemodynamically stable postoperatively. Per office protocol, patient should start aspirin  81 mg daily throughout peri-procedural period and discontinue when he resumes Plavix .    I will route this recommendation to the requesting party via Epic fax function and remove from pre-op pool. Please call with questions.  Barnie Hila, NP 10/31/2023, 3:38 PM

## 2023-11-01 DIAGNOSIS — E785 Hyperlipidemia, unspecified: Secondary | ICD-10-CM | POA: Diagnosis not present

## 2023-11-01 DIAGNOSIS — E782 Mixed hyperlipidemia: Secondary | ICD-10-CM | POA: Diagnosis not present

## 2023-11-01 DIAGNOSIS — E538 Deficiency of other specified B group vitamins: Secondary | ICD-10-CM | POA: Diagnosis not present

## 2023-11-01 DIAGNOSIS — E739 Lactose intolerance, unspecified: Secondary | ICD-10-CM | POA: Diagnosis not present

## 2023-11-01 DIAGNOSIS — E1169 Type 2 diabetes mellitus with other specified complication: Secondary | ICD-10-CM | POA: Diagnosis not present

## 2023-11-01 DIAGNOSIS — K219 Gastro-esophageal reflux disease without esophagitis: Secondary | ICD-10-CM | POA: Diagnosis not present

## 2023-11-01 DIAGNOSIS — I50810 Right heart failure, unspecified: Secondary | ICD-10-CM | POA: Diagnosis not present

## 2023-11-01 DIAGNOSIS — E1121 Type 2 diabetes mellitus with diabetic nephropathy: Secondary | ICD-10-CM | POA: Diagnosis not present

## 2023-11-01 DIAGNOSIS — E034 Atrophy of thyroid (acquired): Secondary | ICD-10-CM | POA: Diagnosis not present

## 2023-11-01 DIAGNOSIS — Z23 Encounter for immunization: Secondary | ICD-10-CM | POA: Diagnosis not present

## 2023-11-15 DIAGNOSIS — N289 Disorder of kidney and ureter, unspecified: Secondary | ICD-10-CM | POA: Diagnosis not present

## 2023-11-15 DIAGNOSIS — D72829 Elevated white blood cell count, unspecified: Secondary | ICD-10-CM | POA: Diagnosis not present

## 2023-11-25 DIAGNOSIS — E1169 Type 2 diabetes mellitus with other specified complication: Secondary | ICD-10-CM | POA: Diagnosis not present

## 2023-11-25 DIAGNOSIS — E782 Mixed hyperlipidemia: Secondary | ICD-10-CM | POA: Diagnosis not present

## 2023-11-28 DIAGNOSIS — Z0000 Encounter for general adult medical examination without abnormal findings: Secondary | ICD-10-CM | POA: Diagnosis not present

## 2023-11-28 DIAGNOSIS — Z1331 Encounter for screening for depression: Secondary | ICD-10-CM | POA: Diagnosis not present

## 2023-11-28 DIAGNOSIS — Z9181 History of falling: Secondary | ICD-10-CM | POA: Diagnosis not present

## 2023-12-13 DIAGNOSIS — I252 Old myocardial infarction: Secondary | ICD-10-CM | POA: Diagnosis not present

## 2023-12-13 DIAGNOSIS — K635 Polyp of colon: Secondary | ICD-10-CM | POA: Diagnosis not present

## 2023-12-13 DIAGNOSIS — D124 Benign neoplasm of descending colon: Secondary | ICD-10-CM | POA: Diagnosis not present

## 2023-12-13 DIAGNOSIS — Z882 Allergy status to sulfonamides status: Secondary | ICD-10-CM | POA: Diagnosis not present

## 2023-12-13 DIAGNOSIS — Z955 Presence of coronary angioplasty implant and graft: Secondary | ICD-10-CM | POA: Diagnosis not present

## 2023-12-13 DIAGNOSIS — E039 Hypothyroidism, unspecified: Secondary | ICD-10-CM | POA: Diagnosis not present

## 2023-12-13 DIAGNOSIS — Z7902 Long term (current) use of antithrombotics/antiplatelets: Secondary | ICD-10-CM | POA: Diagnosis not present

## 2023-12-13 DIAGNOSIS — I10 Essential (primary) hypertension: Secondary | ICD-10-CM | POA: Diagnosis not present

## 2023-12-13 DIAGNOSIS — R1310 Dysphagia, unspecified: Secondary | ICD-10-CM | POA: Diagnosis not present

## 2023-12-13 DIAGNOSIS — Z860100 Personal history of colon polyps, unspecified: Secondary | ICD-10-CM | POA: Diagnosis not present

## 2023-12-13 DIAGNOSIS — D122 Benign neoplasm of ascending colon: Secondary | ICD-10-CM | POA: Diagnosis not present

## 2023-12-13 DIAGNOSIS — F17210 Nicotine dependence, cigarettes, uncomplicated: Secondary | ICD-10-CM | POA: Diagnosis not present

## 2023-12-13 DIAGNOSIS — J449 Chronic obstructive pulmonary disease, unspecified: Secondary | ICD-10-CM | POA: Diagnosis not present

## 2023-12-13 DIAGNOSIS — E119 Type 2 diabetes mellitus without complications: Secondary | ICD-10-CM | POA: Diagnosis not present

## 2023-12-13 DIAGNOSIS — K222 Esophageal obstruction: Secondary | ICD-10-CM | POA: Diagnosis not present

## 2023-12-13 DIAGNOSIS — Z1211 Encounter for screening for malignant neoplasm of colon: Secondary | ICD-10-CM | POA: Diagnosis not present

## 2023-12-13 DIAGNOSIS — K644 Residual hemorrhoidal skin tags: Secondary | ICD-10-CM | POA: Diagnosis not present

## 2023-12-13 DIAGNOSIS — K449 Diaphragmatic hernia without obstruction or gangrene: Secondary | ICD-10-CM | POA: Diagnosis not present

## 2023-12-13 DIAGNOSIS — I2510 Atherosclerotic heart disease of native coronary artery without angina pectoris: Secondary | ICD-10-CM | POA: Diagnosis not present

## 2023-12-13 DIAGNOSIS — K5730 Diverticulosis of large intestine without perforation or abscess without bleeding: Secondary | ICD-10-CM | POA: Diagnosis not present

## 2023-12-13 DIAGNOSIS — Z880 Allergy status to penicillin: Secondary | ICD-10-CM | POA: Diagnosis not present

## 2023-12-13 DIAGNOSIS — Z794 Long term (current) use of insulin: Secondary | ICD-10-CM | POA: Diagnosis not present

## 2023-12-13 DIAGNOSIS — Z888 Allergy status to other drugs, medicaments and biological substances status: Secondary | ICD-10-CM | POA: Diagnosis not present

## 2023-12-18 ENCOUNTER — Ambulatory Visit (HOSPITAL_BASED_OUTPATIENT_CLINIC_OR_DEPARTMENT_OTHER)
Admission: EM | Admit: 2023-12-18 | Discharge: 2023-12-18 | Disposition: A | Discharge status: Home / Self Care | Attending: Family Medicine | Admitting: Family Medicine

## 2023-12-18 ENCOUNTER — Encounter (HOSPITAL_BASED_OUTPATIENT_CLINIC_OR_DEPARTMENT_OTHER): Payer: Self-pay

## 2023-12-18 DIAGNOSIS — J209 Acute bronchitis, unspecified: Secondary | ICD-10-CM

## 2023-12-18 LAB — POC COVID19/FLU A&B COMBO
Covid Antigen, POC: NEGATIVE
Influenza A Antigen, POC: NEGATIVE
Influenza B Antigen, POC: NEGATIVE

## 2023-12-18 MED ORDER — AZITHROMYCIN 250 MG PO TABS: 250.0000 mg | ORAL_TABLET | Freq: Every day | ORAL | 0 refills | Status: DC

## 2023-12-18 MED ORDER — PREDNISONE 20 MG PO TABS: 40.0000 mg | ORAL_TABLET | Freq: Every day | ORAL | 0 refills | Status: AC

## 2023-12-18 MED ORDER — PROMETHAZINE-DM 6.25-15 MG/5ML PO SYRP: 5.0000 mL | ORAL_SOLUTION | Freq: Four times a day (QID) | ORAL | 0 refills | Status: DC | PRN

## 2023-12-18 NOTE — ED Triage Notes (Signed)
 Onset of scratchy throat, bilateral ear itching, sore throat, cough x 4 days.

## 2023-12-18 NOTE — Discharge Instructions (Addendum)
 Treating you for bronchitis.  Medications as prescribed.  Cough medication at bedtime.  Prednisone  daily with food. Inhaler as needed or nebulizer.  Follow-up as needed

## 2023-12-24 NOTE — ED Provider Notes (Signed)
 [name]    CSN: [id number] Arrival date & time: 12/18/23  1118      History   Chief Complaint Chief Complaint  Patient presents with   Sore Throat   Cough   Nasal Congestion    HPI Scott Koch is a 68 y.o. male.   Pt is a 68 year old male that presents with URI symptoms. Onset of scratchy throat, bilateral ear itching, sore throat, cough x 4 days. No fever.    Sore Throat  Cough   Past Medical History:  Diagnosis Date   Asthma    inhaler used only when seasonal allergies    Chronic diastolic heart failure (HCC) 10/24/2022   Complication of anesthesia    patient statesgets rowdy when wake up   COPD (chronic obstructive pulmonary disease) (HCC)    COPD (chronic obstructive pulmonary disease) (HCC) 10/22/2020   Diabetes mellitus without complication (HCC)    fasting blood sugar avg 120   Heart murmur    HNP (herniated nucleus pulposus), lumbar 01/07/2014   Hypertension    Hypothyroidism    NSTEMI (non-ST elevated myocardial infarction) (HCC) 10/23/2022   Pneumonia    hx   Protein in urine    Type 2 diabetes mellitus with complication, with long-term current use of insulin  (HCC) 10/23/2022    Patient Active Problem List   Diagnosis Date Noted   Chronic diastolic heart failure (HCC) 10/24/2022   NSTEMI (non-ST elevated myocardial infarction) (HCC) 10/23/2022   Type 2 diabetes mellitus with complication, with long-term current use of insulin  (HCC) 10/23/2022   Asthma 10/22/2020   Complication of anesthesia 10/22/2020   COPD (chronic obstructive pulmonary disease) (HCC) 10/22/2020   Diabetes mellitus without complication (HCC) 10/22/2020   Heart murmur 10/22/2020   Hypertension 10/22/2020   Hypothyroidism 10/22/2020   Pneumonia 10/22/2020   Protein in urine 10/22/2020   HNP (herniated nucleus pulposus), lumbar 01/07/2014    Past Surgical History:  Procedure Laterality Date   ANTERIOR CERVICAL DECOMP/DISCECTOMY FUSION  01/13/2012    Procedure: ANTERIOR CERVICAL DECOMPRESSION/DISCECTOMY FUSION 3 LEVELS;  Surgeon: Arley SHAUNNA Helling, MD;  Location: [facility];  Service: Neurosurgery;  Laterality: N/A;  Cervical three-four, cervical four five, cervical six-seven anterior cervical decompression fusion with plate    CORONARY ANGIOPLASTY  2000   stent   CORONARY LITHOTRIPSY N/A 10/25/2022   Procedure: CORONARY LITHOTRIPSY;  Surgeon: Anner Alm ORN, MD;  Location: [facility] INVASIVE CV LAB;  Service: Cardiovascular;  Laterality: N/A;   CORONARY STENT INTERVENTION N/A 10/25/2022   Procedure: CORONARY STENT INTERVENTION;  Surgeon: Anner Alm ORN, MD;  Location: [facility] INVASIVE CV LAB;  Service: Cardiovascular;  Laterality: N/A;   LEFT HEART CATH AND CORONARY ANGIOGRAPHY N/A 10/25/2022   Procedure: LEFT HEART CATH AND CORONARY ANGIOGRAPHY;  Surgeon: Anner Alm ORN, MD;  Location: [facility] INVASIVE CV LAB;  Service: Cardiovascular;  Laterality: N/A;   SHOULDER ARTHROSCOPY Right 14       Home Medications    Prior to Admission medications   Medication Sig Start Date End Date Taking? Authorizing Provider  azithromycin  (ZITHROMAX ) 250 MG tablet Take 1 tablet (250 mg total) by mouth daily. Take first 2 tablets together, then 1 every day until finished. 12/18/23  Yes Tommey Barret A, FNP  promethazine -dextromethorphan (PROMETHAZINE -DM) 6.25-15 MG/5ML syrup Take 5 mLs by mouth 4 (four) times daily as needed for cough. 12/18/23  Yes Tabor Denham A, FNP  acetaminophen  (TYLENOL ) 500 MG tablet Take 1,000 mg by mouth 2 (two) times daily as  needed for moderate pain, headache or fever.    [provider]  albuterol  (PROVENTIL  HFA;VENTOLIN  HFA) 108 (90 BASE) MCG/ACT inhaler Inhale 2 puffs into the lungs every 6 (six) hours as needed for wheezing or shortness of breath. For shortness of breath    [provider]  Bioflavonoid Products (VITAMIN C) CHEW Chew 1 tablet by mouth daily.    [provider]  BRILINTA  90 MG TABS tablet Take 90 mg by  mouth 2 (two) times daily. 09/20/23   [provider]  cetirizine (ZYRTEC) 10 MG tablet Take 10 mg by mouth daily.    [provider]  clopidogrel  (PLAVIX ) 75 MG tablet Take 1 tablet (75 mg total) by mouth daily. 08/05/23   Carlin Delon BROCKS, NP  Dulaglutide (TRULICITY) 0.75 MG/0.5ML SOPN Inject 0.75 mg into the skin once a week.    [provider]  ipratropium-albuterol  (DUONEB) 0.5-2.5 (3) MG/3ML SOLN Take 3 mLs by nebulization every 6 (six) hours as needed (wheezing, shortness of breath).    [provider]  JANUMET XR 50-1000 MG TB24 Take 1 tablet by mouth at bedtime. 11/24/20   [provider]  ketoconazole  (NIZORAL ) 2 % cream Apply 1 Application topically 2 (two) times daily. Apply 1gm to heel areas twice daily and rub in well 10/07/22   McCaughan, Dia D, DPM  LANTUS SOLOSTAR 100 UNIT/ML Solostar Pen Inject 20 Units into the skin daily as needed (BS > 120 in the morning). 01/27/21   [provider]  levothyroxine  (SYNTHROID ) 100 MCG tablet Take 100 mcg by mouth daily before breakfast.    [provider]  losartan  (COZAAR ) 25 MG tablet Take 25 mg by mouth daily. 02/25/21   [provider]  montelukast  (SINGULAIR ) 10 MG tablet Take 10 mg by mouth at bedtime.    [provider]  nitroGLYCERIN  (NITROSTAT ) 0.4 MG SL tablet Place 1 tablet (0.4 mg total) under the tongue every 5 (five) minutes x 3 doses as needed for chest pain. 10/26/22   Vicci Rollo SAUNDERS, PA-C  omeprazole (PRILOSEC) 20 MG capsule Take 20 mg by mouth daily. 02/25/21   [provider]  rosuvastatin  (CRESTOR ) 40 MG tablet Take 1 tablet (40 mg total) by mouth daily. 08/05/23   Carlin Delon BROCKS, NP  tamsulosin  (FLOMAX ) 0.4 MG CAPS capsule Take 0.4 mg by mouth daily. 09/15/21   [provider]  torsemide  (DEMADEX ) 20 MG tablet Take 1 tablet (20 mg total) by mouth daily. 12/07/22   Carlin Delon BROCKS, NP  Vitamin D, Ergocalciferol, (DRISDOL) 1.25 MG  (50000 UNIT) CAPS capsule Take 50,000 Units by mouth every Friday.    [provider]  VITAMIN E PO Take 1 capsule by mouth daily.    [provider]    Family History Family History  Problem Relation Age of Onset   Heart attack Mother    Jaundice Mother    Hypertension Mother    Heart disease Mother    Hepatitis Mother    Prostate cancer Father    Arthritis Brother    Hypertension Brother    Heart disease Brother     Social History Social History   Tobacco Use   Smoking status: Former    Current packs/day: 0.00    Average packs/day: 1 pack/day for 35.0 years (35.0 ttl pk-yrs)    Types: Cigarettes    Start date: 12/04/1978    Quit date: 12/03/2013    Years since quitting: 10.0   Smokeless tobacco: Never  Tobacco comments:    nicotine  patch  Substance Use Topics   Alcohol use: No   Drug use: No     Allergies   Hazelnut (filbert), Norel ad [chlorphen-pe-acetaminophen ], Penicillins, Sulfa antibiotics, and Neosporin [neomycin-bacitracin  zn-polymyx]   Review of Systems Review of Systems  Respiratory:  Positive for cough.      Physical Exam Triage Vital Signs ED Triage Vitals  Encounter Vitals Group     BP 12/18/23 1138 120/77     Girls Systolic BP Percentile --      Girls Diastolic BP Percentile --      Boys Systolic BP Percentile --      Boys Diastolic BP Percentile --      Pulse Rate 12/18/23 1138 90     Resp 12/18/23 1138 20     Temp 12/18/23 1138 98.6 F (37 C)     Temp src --      SpO2 12/18/23 1138 95 %     Weight --      Height --      Head Circumference --      Peak Flow --      Pain Score 12/18/23 1140 1     Pain Loc --      Pain Education --      Exclude from Growth Chart --    No data found.  Updated Vital Signs BP 120/77 (BP Location: Right Arm)   Pulse 90   Temp 98.6 F (37 C)   Resp 20   SpO2 95%   Visual Acuity Right Eye Distance:   Left Eye Distance:   Bilateral Distance:    Right Eye Near:   Left Eye  Near:    Bilateral Near:     Physical Exam Constitutional:      General: He is not in acute distress.    Appearance: Normal appearance. He is not ill-appearing, toxic-appearing or diaphoretic.  HENT:     Right Ear: Tympanic membrane, ear canal and external ear normal.     Left Ear: Tympanic membrane, ear canal and external ear normal.     Mouth/Throat:     Pharynx: Oropharynx is clear.  Eyes:     Conjunctiva/sclera: Conjunctivae normal.  Cardiovascular:     Rate and Rhythm: Normal rate and regular rhythm.     Pulses: Normal pulses.     Heart sounds: Normal heart sounds.  Pulmonary:     Effort: Pulmonary effort is normal.     Breath sounds: Normal breath sounds.  Musculoskeletal:        General: Normal range of motion.  Skin:    General: Skin is warm and dry.  Neurological:     Mental Status: He is alert.  Psychiatric:        Mood and Affect: Mood normal.      UC Treatments / Results  Labs (all labs ordered are listed, but only abnormal results are displayed) Labs Reviewed  POC COVID19/FLU A&B COMBO - Normal    EKG   Radiology No results found.  Procedures Procedures (including critical care time)  Medications Ordered in UC Medications - No data to display  Initial Impression / Assessment and Plan / UC Course  I have reviewed the triage vital signs and the nursing notes.  Pertinent labs & imaging results that were available during my care of the patient were reviewed by me and considered in my medical decision making (see chart for details).     Acute Bronchitis- Treating for bronchitis.  Medications as prescribed.  Cough medication at bedtime.  Prednisone  daily with food. Inhaler as needed or nebulizer.  Follow-up as needed Final Clinical Impressions(s) / UC Diagnoses   Final diagnoses:  Acute bronchitis, unspecified organism     Discharge Instructions      Treating you for bronchitis.  Medications as prescribed.  Cough medication at bedtime.   Prednisone  daily with food. Inhaler as needed or nebulizer.  Follow-up as needed   ED Prescriptions     Medication Sig Dispense Auth. Provider   azithromycin  (ZITHROMAX ) 250 MG tablet Take 1 tablet (250 mg total) by mouth daily. Take first 2 tablets together, then 1 every day until finished. 6 tablet Darnell Stimson A, FNP   predniSONE  (DELTASONE ) 20 MG tablet Take 2 tablets (40 mg total) by mouth daily with breakfast for 5 days. 10 tablet Chrisandra Wiemers A, FNP   promethazine -dextromethorphan (PROMETHAZINE -DM) 6.25-15 MG/5ML syrup Take 5 mLs by mouth 4 (four) times daily as needed for cough. 118 mL Adah Corning A, FNP      PDMP not reviewed this encounter.   Adah Corning LABOR, FNP 12/24/23 1529

## 2023-12-26 ENCOUNTER — Other Ambulatory Visit: Payer: Self-pay | Admitting: Cardiology

## 2023-12-29 ENCOUNTER — Encounter (HOSPITAL_BASED_OUTPATIENT_CLINIC_OR_DEPARTMENT_OTHER): Payer: Self-pay

## 2023-12-29 ENCOUNTER — Ambulatory Visit (INDEPENDENT_AMBULATORY_CARE_PROVIDER_SITE_OTHER): Admit: 2023-12-29 | Discharge: 2023-12-29 | Disposition: A | Admitting: Radiology

## 2023-12-29 ENCOUNTER — Ambulatory Visit (HOSPITAL_BASED_OUTPATIENT_CLINIC_OR_DEPARTMENT_OTHER)
Admission: EM | Admit: 2023-12-29 | Discharge: 2023-12-29 | Disposition: A | Discharge status: Home / Self Care | Attending: Family Medicine | Admitting: Family Medicine

## 2023-12-29 DIAGNOSIS — R051 Acute cough: Secondary | ICD-10-CM | POA: Diagnosis not present

## 2023-12-29 DIAGNOSIS — R059 Cough, unspecified: Secondary | ICD-10-CM | POA: Diagnosis not present

## 2023-12-29 DIAGNOSIS — J209 Acute bronchitis, unspecified: Secondary | ICD-10-CM

## 2023-12-29 DIAGNOSIS — R918 Other nonspecific abnormal finding of lung field: Secondary | ICD-10-CM | POA: Diagnosis not present

## 2023-12-29 NOTE — Discharge Instructions (Addendum)
 Acute bronchitis with cough: Chest x-ray appears normal, no pneumonia.  Patient has completed the prednisone .  Patient has completed the azithromycin .  The azithromycin  should have an antibiotic effect for 5 to 10 days after the last pill.  Continue DuoNeb treatments, every 6 hours.  Use albuterol  inhaler between times if needed.  Get plenty of fluids.  Encouraged gentle exercise like walking around the house for 5 to 10 minutes every hour.  If not completely improved in 5 to 10 days, follow-up here or go to primary care.  See below for signs and symptoms of worsening condition and reasons to go to the emergency room.  Contact a health care provider if: Your symptoms do not improve after 2 weeks. You have trouble coughing up the mucus. Your cough keeps you awake at night. You have a fever.  Get help right away if you: Cough up blood. Feel pain in your chest. Have severe shortness of breath. Faint or keep feeling like you are going to faint. Have a severe headache. Have a fever or chills that get worse. These symptoms may represent a serious problem that is an emergency. Do not wait to see if the symptoms will go away. Get medical help right away. Call your local emergency services (911 in the [place] Do not drive yourself to the hospital.

## 2023-12-29 NOTE — ED Provider Notes (Signed)
 Scott Koch    CSN: [id number] Arrival date & time: 12/29/23  1458      History   Chief Complaint No chief complaint on file.   HPI Scott Koch is a 68 y.o. male.   68 year old male who was seen on 12/18/2023.  He reported cough, sore throat, itchy ears and congestion since 12/14/2023.  He was treated for bronchitis with prednisone , azithromycin  and Promethazine  DM.  He reports that initially he felt better with the medication.  But as soon as he finished the prednisone  and the azithromycin , the symptoms came back.  He is now reporting malaise or fatigue, ear pain and itching, sore throat, cough and nasal congestion.  He reports that in the mornings sometimes he coughs up slightly green mucus but during the day it is clear or white.  He denies fever, nausea, vomiting, constipation, diarrhea.     Past Medical History:  Diagnosis Date   Asthma    inhaler used only when seasonal allergies    Chronic diastolic heart failure (HCC) 10/24/2022   Complication of anesthesia    patient statesgets rowdy when wake up   COPD (chronic obstructive pulmonary disease) (HCC)    COPD (chronic obstructive pulmonary disease) (HCC) 10/22/2020   Diabetes mellitus without complication (HCC)    fasting blood sugar avg 120   Heart murmur    HNP (herniated nucleus pulposus), lumbar 01/07/2014   Hypertension    Hypothyroidism    NSTEMI (non-ST elevated myocardial infarction) (HCC) 10/23/2022   Pneumonia    hx   Protein in urine    Type 2 diabetes mellitus with complication, with long-term current use of insulin  (HCC) 10/23/2022    Patient Active Problem List   Diagnosis Date Noted   Chronic diastolic heart failure (HCC) 10/24/2022   NSTEMI (non-ST elevated myocardial infarction) (HCC) 10/23/2022   Type 2 diabetes mellitus with complication, with long-term current use of insulin  (HCC) 10/23/2022   Asthma 10/22/2020   Complication of anesthesia 10/22/2020   COPD (chronic obstructive  pulmonary disease) (HCC) 10/22/2020   Diabetes mellitus without complication (HCC) 10/22/2020   Heart murmur 10/22/2020   Hypertension 10/22/2020   Hypothyroidism 10/22/2020   Pneumonia 10/22/2020   Protein in urine 10/22/2020   HNP (herniated nucleus pulposus), lumbar 01/07/2014    Past Surgical History:  Procedure Laterality Date   ANTERIOR CERVICAL DECOMP/DISCECTOMY FUSION  01/13/2012   Procedure: ANTERIOR CERVICAL DECOMPRESSION/DISCECTOMY FUSION 3 LEVELS;  Surgeon: Arley SHAUNNA Helling, MD;  Location: [facility];  Service: Neurosurgery;  Laterality: N/A;  Cervical three-four, cervical four five, cervical six-seven anterior cervical decompression fusion with plate    CORONARY ANGIOPLASTY  2000   stent   CORONARY LITHOTRIPSY N/A 10/25/2022   Procedure: CORONARY LITHOTRIPSY;  Surgeon: Anner Alm ORN, MD;  Location: [facility] INVASIVE CV LAB;  Service: Cardiovascular;  Laterality: N/A;   CORONARY STENT INTERVENTION N/A 10/25/2022   Procedure: CORONARY STENT INTERVENTION;  Surgeon: Anner Alm ORN, MD;  Location: [facility] INVASIVE CV LAB;  Service: Cardiovascular;  Laterality: N/A;   LEFT HEART CATH AND CORONARY ANGIOGRAPHY N/A 10/25/2022   Procedure: LEFT HEART CATH AND CORONARY ANGIOGRAPHY;  Surgeon: Anner Alm ORN, MD;  Location: [facility] INVASIVE CV LAB;  Service: Cardiovascular;  Laterality: N/A;   SHOULDER ARTHROSCOPY Right 14       Home Medications    Prior to Admission medications   Medication Sig Start Date End Date Taking? Authorizing Provider  acetaminophen  (TYLENOL ) 500 MG tablet Take 1,000 mg by mouth 2 (two)  times daily as needed for moderate pain, headache or fever.    [provider]  albuterol  (PROVENTIL  HFA;VENTOLIN  HFA) 108 (90 BASE) MCG/ACT inhaler Inhale 2 puffs into the lungs every 6 (six) hours as needed for wheezing or shortness of breath. For shortness of breath    [provider]  Bioflavonoid Products (VITAMIN C) CHEW Chew 1 tablet by mouth daily.    [provider]  BRILINTA  90 MG TABS tablet Take 90 mg by mouth 2 (two) times daily. 09/20/23   [provider]  cetirizine (ZYRTEC) 10 MG tablet Take 10 mg by mouth daily.    [provider]  clopidogrel  (PLAVIX ) 75 MG tablet Take 1 tablet (75 mg total) by mouth daily. 08/05/23   Carlin Delon BROCKS, NP  Dulaglutide (TRULICITY) 0.75 MG/0.5ML SOPN Inject 0.75 mg into the skin once a week.    [provider]  ipratropium-albuterol  (DUONEB) 0.5-2.5 (3) MG/3ML SOLN Take 3 mLs by nebulization every 6 (six) hours as needed (wheezing, shortness of breath).    [provider]  JANUMET XR 50-1000 MG TB24 Take 1 tablet by mouth at bedtime. 11/24/20   [provider]  ketoconazole  (NIZORAL ) 2 % cream Apply 1 Application topically 2 (two) times daily. Apply 1gm to heel areas twice daily and rub in well 10/07/22   McCaughan, Dia D, DPM  LANTUS SOLOSTAR 100 UNIT/ML Solostar Pen Inject 20 Units into the skin daily as needed (BS > 120 in the morning). 01/27/21   [provider]  levothyroxine  (SYNTHROID ) 100 MCG tablet Take 100 mcg by mouth daily before breakfast.    [provider]  losartan  (COZAAR ) 25 MG tablet Take 25 mg by mouth daily. 02/25/21   [provider]  montelukast  (SINGULAIR ) 10 MG tablet Take 10 mg by mouth at bedtime.    [provider]  nitroGLYCERIN  (NITROSTAT ) 0.4 MG SL tablet Place 1 tablet (0.4 mg total) under the tongue every 5 (five) minutes x 3 doses as needed for chest pain. 10/26/22   Vicci Rollo SAUNDERS, PA-C  omeprazole (PRILOSEC) 20 MG capsule Take 20 mg by mouth daily. 02/25/21   [provider]  promethazine -dextromethorphan (PROMETHAZINE -DM) 6.25-15 MG/5ML syrup Take 5 mLs by mouth 4 (four) times daily as needed for cough. 12/18/23   Adah Corning A, FNP  rosuvastatin  (CRESTOR ) 40 MG tablet Take 1 tablet (40 mg total) by mouth daily. 08/05/23   Carlin Delon BROCKS, NP  tamsulosin  (FLOMAX ) 0.4 MG CAPS capsule  Take 0.4 mg by mouth daily. 09/15/21   [provider]  torsemide  (DEMADEX ) 20 MG tablet TAKE ONE TABLET BY MOUTH DAILY 12/26/23   Carlin Delon BROCKS, NP  Vitamin D, Ergocalciferol, (DRISDOL) 1.25 MG (50000 UNIT) CAPS capsule Take 50,000 Units by mouth every Friday.    [provider]  VITAMIN E PO Take 1 capsule by mouth daily.    [provider]    Family History Family History  Problem Relation Age of Onset   Heart attack Mother    Jaundice Mother    Hypertension Mother    Heart disease Mother    Hepatitis Mother    Prostate cancer Father    Arthritis Brother    Hypertension Brother    Heart disease Brother     Social History Social History   Tobacco Use   Smoking status: Former    Current packs/day: 0.00    Average packs/day: 1 pack/day for 35.0 years (35.0 ttl pk-yrs)    Types:  Cigarettes    Start date: 12/04/1978    Quit date: 12/03/2013    Years since quitting: 10.0   Smokeless tobacco: Never   Tobacco comments:    nicotine  patch  Substance Use Topics   Alcohol use: No   Drug use: No     Allergies   Hazelnut (filbert), Norel ad [chlorphen-pe-acetaminophen ], Penicillins, Sulfa antibiotics, and Neosporin [neomycin-bacitracin  zn-polymyx]   Review of Systems Review of Systems  Constitutional:  Positive for fatigue. Negative for chills and fever.  HENT:  Positive for congestion, ear pain, postnasal drip, rhinorrhea and sore throat.   Eyes:  Negative for pain and visual disturbance.  Respiratory:  Positive for cough.   Cardiovascular:  Negative for chest pain and palpitations.  Gastrointestinal:  Negative for abdominal pain, constipation, diarrhea, nausea and vomiting.  Genitourinary:  Negative for dysuria and hematuria.  Musculoskeletal:  Negative for arthralgias and back pain.  Skin:  Negative for color change and rash.  Neurological:  Negative for seizures and syncope.  All other systems reviewed and are negative.    Physical  Exam Triage Vital Signs ED Triage Vitals  Encounter Vitals Group     BP 12/29/23 1554 106/71     Girls Systolic BP Percentile --      Girls Diastolic BP Percentile --      Boys Systolic BP Percentile --      Boys Diastolic BP Percentile --      Pulse Rate 12/29/23 1554 98     Resp 12/29/23 1554 18     Temp 12/29/23 1554 98.6 F (37 C)     Temp Source 12/29/23 1554 Oral     SpO2 12/29/23 1554 96 %     Weight --      Height --      Head Circumference --      Peak Flow --      Pain Score 12/29/23 1553 0     Pain Loc --      Pain Education --      Exclude from Growth Chart --    No data found.  Updated Vital Signs BP 106/71 (BP Location: Right Arm)   Pulse 98   Temp 98.6 F (37 C) (Oral)   Resp 18   SpO2 96%   Visual Acuity Right Eye Distance:   Left Eye Distance:   Bilateral Distance:    Right Eye Near:   Left Eye Near:    Bilateral Near:     Physical Exam Vitals and nursing note reviewed.  Constitutional:      General: He is not in acute distress.    Appearance: He is well-developed. He is ill-appearing. He is not toxic-appearing or diaphoretic.  HENT:     Head: Normocephalic and atraumatic.     Right Ear: Hearing, tympanic membrane, ear canal and external ear normal.     Left Ear: Hearing, tympanic membrane, ear canal and external ear normal.     Nose: Congestion and rhinorrhea present. Rhinorrhea is clear.     Right Sinus: No maxillary sinus tenderness or frontal sinus tenderness.     Left Sinus: No maxillary sinus tenderness or frontal sinus tenderness.     Mouth/Throat:     Lips: Pink.     Mouth: Mucous membranes are moist.     Pharynx: Uvula midline. No oropharyngeal exudate or posterior oropharyngeal erythema.     Tonsils: No tonsillar exudate.  Eyes:     Conjunctiva/sclera: Conjunctivae normal.     Pupils: Pupils  are equal, round, and reactive to light.  Cardiovascular:     Rate and Rhythm: Normal rate and regular rhythm.     Heart sounds: S1  normal and S2 normal. No murmur heard. Pulmonary:     Effort: Pulmonary effort is normal. No respiratory distress.     Breath sounds: Examination of the right-upper field reveals wheezing. Examination of the left-upper field reveals wheezing. Examination of the right-lower field reveals rhonchi. Examination of the left-lower field reveals rhonchi. Wheezing (Rare inspiratory wheeze) and rhonchi (Mild rhonchi in the bases) present. No decreased breath sounds or rales.  Abdominal:     General: Bowel sounds are normal.     Palpations: Abdomen is soft.     Tenderness: There is no abdominal tenderness.  Musculoskeletal:        General: No swelling.     Cervical back: Neck supple.  Lymphadenopathy:     Head:     Right side of head: No submental, submandibular, tonsillar, preauricular or posterior auricular adenopathy.     Left side of head: No submental, submandibular, tonsillar, preauricular or posterior auricular adenopathy.     Cervical: Cervical adenopathy present.     Right cervical: Superficial cervical adenopathy present.     Left cervical: Superficial cervical adenopathy present.  Skin:    General: Skin is warm and dry.     Capillary Refill: Capillary refill takes less than 2 seconds.     Findings: No rash.  Neurological:     Mental Status: He is alert and oriented to person, place, and time.  Psychiatric:        Mood and Affect: Mood normal.      UC Treatments / Results  Labs (all labs ordered are listed, but only abnormal results are displayed) Labs Reviewed - No data to display  EKG   Radiology DG Chest 2 View Result Date: 12/29/2023 CLINICAL DATA:  Cough. EXAM: CHEST - 2 VIEW COMPARISON:  10/22/2022 FINDINGS: The cardiomediastinal contours are normal. Mild bronchial thickening. Pulmonary vasculature is normal. No consolidation, pleural effusion, or pneumothorax. No acute osseous abnormalities are seen. Surgical hardware in the lower cervical spine. IMPRESSION: Mild  bronchial thickening.  No focal airspace disease. Electronically Signed   By: Andrea Gasman M.D.   On: 12/29/2023 17:13    Procedures Procedures (including critical Koch time)  Medications Ordered in UC Medications - No data to display  Initial Impression / Assessment and Plan / UC Course  I have reviewed the triage vital signs and the nursing notes.  Pertinent labs & imaging results that were available during my Koch of the patient were reviewed by me and considered in my medical decision making (see chart for details).  Plan of Koch (see discharge instructions for additional patient precautions and education): Acute bronchitis with cough: Chest x-ray appears normal, no pneumonia.  Patient has completed the prednisone .  Patient has completed the azithromycin .  The azithromycin  should have an antibiotic effect for 5 to 10 days after the last pill.  Continue DuoNeb treatments, every 6 hours.  Use albuterol  inhaler between times if needed.  Get plenty of fluids.  Encouraged gentle exercise like walking around the house for 5 to 10 minutes every hour. If not completely improved in 5 to 10 days, follow-up here or go to primary Koch.  See discharge instructions for signs and symptoms of worsening condition and reasons to go to the emergency room.  I reviewed the plan of Koch with the patient and/or the patient's guardian.  The patient and/or guardian had time to ask questions and acknowledged that the questions were answered.  Final Clinical Impressions(s) / UC Diagnoses   Final diagnoses:  Acute cough  Acute bronchitis, unspecified organism     Discharge Instructions      Acute bronchitis with cough: Chest x-ray appears normal, no pneumonia.  Patient has completed the prednisone .  Patient has completed the azithromycin .  The azithromycin  should have an antibiotic effect for 5 to 10 days after the last pill.  Continue DuoNeb treatments, every 6 hours.  Use albuterol  inhaler between times  if needed.  Get plenty of fluids.  Encouraged gentle exercise like walking around the house for 5 to 10 minutes every hour.  If not completely improved in 5 to 10 days, follow-up here or go to primary Koch.  See below for signs and symptoms of worsening condition and reasons to go to the emergency room.  Contact a health Koch provider if: Your symptoms do not improve after 2 weeks. You have trouble coughing up the mucus. Your cough keeps you awake at night. You have a fever.  Get help right away if you: Cough up blood. Feel pain in your chest. Have severe shortness of breath. Faint or keep feeling like you are going to faint. Have a severe headache. Have a fever or chills that get worse. These symptoms may represent a serious problem that is an emergency. Do not wait to see if the symptoms will go away. Get medical help right away. Call your local emergency services (911 in the [place] Do not drive yourself to the hospital.     ED Prescriptions   None    PDMP not reviewed this encounter.   Ival Domino, FNP 12/29/23 [phone]

## 2023-12-29 NOTE — ED Triage Notes (Signed)
 Pt c/o bilateral ear pain, sore throat, cough, nasal congestion x 11 days.  Denies fever. Pt was seen on 11/23, finished antibiotics/medications that was given at visit. Pt is taking mucinex dm for current symptoms.

## 2024-01-11 ENCOUNTER — Ambulatory Visit: Admitting: Podiatry

## 2024-01-11 DIAGNOSIS — B351 Tinea unguium: Secondary | ICD-10-CM | POA: Diagnosis not present

## 2024-01-11 DIAGNOSIS — M79674 Pain in right toe(s): Secondary | ICD-10-CM | POA: Diagnosis not present

## 2024-01-11 DIAGNOSIS — M79675 Pain in left toe(s): Secondary | ICD-10-CM | POA: Diagnosis not present

## 2024-01-11 NOTE — Progress Notes (Unsigned)
   Subjective:  Patient ID: Scott Koch, male    DOB: 02/10/1959,  MRN: [id number]  Scott Koch presents to clinic today for:  Chief Complaint  Patient presents with   Diabetes    dfc   Patient notes nails are thick, discolored, elongated and painful in shoegear when trying to ambulate.    PCP is Ivonne Andrew, NP.  Allergies  Allergen Reactions   Crab [Shellfish Allergy]     itching   Review of Systems: Negative except as noted in the HPI.  Objective:  Scott Koch is a pleasant 68 y.o. male in NAD. AAO x 3.  Vascular Examination: Capillary refill time is 3-5 seconds to toes bilateral. Palpable pedal pulses b/l LE. Digital hair present b/l. No pedal edema b/l. Skin temperature gradient WNL b/l. No varicosities b/l. No cyanosis or clubbing noted b/l.   Dermatological Examination: Pedal skin with normal turgor, texture and tone b/l. No open wounds. No interdigital macerations b/l. Toenails x10 are 3mm thick, discolored, dystrophic with subungual debris. There is pain with compression of the nail plates.  They are elongated x10     Latest Ref Rng & Units 08/26/2022    9:20 AM 05/26/2022   10:20 AM 02/22/2022   10:08 AM 02/22/2022    9:57 AM 11/20/2021   10:59 AM  Hemoglobin A1C  Hemoglobin-A1c 4.0 - 5.6 % 6.7  8.3  9.1  9.1    9.1    9.1    9.1  6.3    Assessment/Plan: 1. Pain due to onychomycosis of nail     The mycotic toenails were sharply debrided x10 with sterile nail nippers and a power debriding burr to decrease bulk/thickness and length.    Return in about 3 months (around 12/01/2022) for [facility]   Clerance Lav, DPM, FACFAS [facility]     [address] [place], [place] [address]                Office [phone]  Fax [phone]

## 2024-01-31 ENCOUNTER — Other Ambulatory Visit: Payer: Self-pay

## 2024-01-31 NOTE — Progress Notes (Unsigned)
 " Cardiology Office Note:    Date:  01/31/2024   ID:  Scott Koch, DOB Mar 10, 1955, MRN [id number]  PCP:  Pandora Therisa RAMAN, NP  Cardiologist:  Redell Leiter, MD    Referring MD: Pandora Therisa RAMAN, NP    ASSESSMENT:    1. Coronary artery disease of native artery of native heart with stable angina pectoris   2. Mixed hyperlipidemia   3. Elevated lipoprotein A level   4. Hypertensive heart disease with heart failure (HCC)   5. Diabetes mellitus without complication (HCC)   6. Chronic obstructive pulmonary disease, unspecified COPD type (HCC)    PLAN:    In order of problems listed above:  RV continues to do well with CAD following revascularization he takes single antiplatelet clopidogrel  along with his high intensity statin and has had no recurrent anginal discomfort.  At this time I do not think he requires an ischemia evaluation Lipids are ideal continue his high intensity statin Blood pressure at target he will continue his current loop diuretic and has no signs or symptoms of heart failure and ejection fraction is normalized. Stable diabetes A1c of 7.3% managed by his PCP Stable COPD currently asymptomatic   Next appointment: I will see him in 1 year labs will be followed in his PCP office and given a prescription for new nitroglycerin    Medication Adjustments/Labs and Tests Ordered: Current medicines are reviewed at length with the patient today.  Concerns regarding medicines are outlined above.  No orders of the defined types were placed in this encounter.  No orders of the defined types were placed in this encounter.    History of Present Illness:    Scott Koch is a 69 y.o. male with a hx of CAD with drug-eluting stent right coronary artery 10/23/2022 with ACS hypertensive heart disease with diastolic heart failure type 2 diabetes hyperlipidemia hypothyroidism and COPD last seen 01/28/2023.He had PCI and stent complex right coronary artery 10/25/2022 in the context of acute  coronary syndrome non-ST elevation MI left ventricular ejection fraction is normal 55 to 60%  Compliance with diet, lifestyle and medications: Yes  [name] is seen today along with his wife. Both feel he is doing well he has had no anginal discomfort has not needed nitroglycerin  tolerates his statin without muscle pain or weakness and remains active. Past Medical History:  Diagnosis Date   Asthma    inhaler used only when seasonal allergies    Chronic diastolic heart failure (HCC) 10/24/2022   Complication of anesthesia    patient statesgets rowdy when wake up   COPD (chronic obstructive pulmonary disease) (HCC)    COPD (chronic obstructive pulmonary disease) (HCC) 10/22/2020   Diabetes mellitus without complication (HCC)    fasting blood sugar avg 120   Heart murmur    HNP (herniated nucleus pulposus), lumbar 01/07/2014   Hypertension    Hypothyroidism    NSTEMI (non-ST elevated myocardial infarction) (HCC) 10/23/2022   Pneumonia    hx   Protein in urine    Type 2 diabetes mellitus with complication, with long-term current use of insulin  (HCC) 10/23/2022    Current Medications: Active Medications[1]    EKGs/Labs/Other Studies Reviewed:    The following studies were reviewed today:  Cardiac Studies & Procedures   ______________________________________________________________________________________________ CARDIAC CATHETERIZATION  CARDIAC CATHETERIZATION 10/25/2022  Conclusion   Culprit Lesion Segment: prox RCA-1 lesion is 60% stenosed. Mid RCA lesion is 85% stenosed.  Prox RCA-2 lesion is 80% stenosed.   Following Balloon PTCA and  Shockwave Lithotripsy, a drug-eluting stent was successfully placed covering all 3 lesions of culprit segment, using a SYNERGY XD 3.0X38 => postdilated to 3.6 mm   Post intervention, there is a 0% residual stenosis in the distal 2 lesions with 10% residual stenosis in the heavily calcified eccentric 60% segment    -------------------------------------------------------------------------   Mid LM to Prox LAD lesion is 25% stenosed.   -------------------------------------------------------------------------   The left ventricular systolic function is normal, by echocardiogram; LV end diastolic pressure is normal.   ==========================================  POST-CATH FINDINGS Severe single-vessel CAD with heavily calcified eccentric proximal RCA 60% tapering to a 80% shelflike lesion followed by a napkin ring 85% stenosis Successful Shockwave Lithotripsy based DES PCI of the RCA (Synergy XD 3.0 mm x 38 mm postdilated to 3.6 mm), reducing stenoses to a few focal areas of 10% but otherwise 0% with TIMI-3 flow restored Otherwise moderate calcification in the distal LM-proximal LAD and LCx but at most 30% proximal LAD stenosis. Normal LVEDP (preserved EF by echo)   RECOMMENDATIONS   Continue titrate aggressive GDMT for CAD   Recommend uninterrupted dual antiplatelet therapy with Aspirin  81mg  daily and Ticagrelor  90mg  twice daily for a minimum of 12 months (ACS-Class I recommendation).   After 1 year, okay to DC ASA and continue with SAPT either with maintenance dose ticagrelor  60 mg twice daily or converting to complete a grill 75 mg daily.   In the absence of any other complications or medical issues, we expect the patient to be ready for discharge from an interventional cardiology perspective on 10/25/2022.    Alm Clay, MD  Findings Coronary Findings Diagnostic  Dominance: Right  Left Main Vessel was injected. Vessel is normal in caliber. Mid LM to Prox LAD lesion is 25% stenosed.  Left Anterior Descending Vessel is moderate in size. The vessel exhibits minimal luminal irregularities.  First Diagonal Branch Vessels moderate in size  Second Diagonal Branch Vessel is small in size.  Left Circumflex Vessel is small.  First Obtuse Marginal Branch Vessel is small in size. The vessel  exhibits minimal luminal irregularities.  Right Coronary Artery Vessel was injected. Vessel is large. There is severe focal disease in the vessel. The vessel is severely calcified. Prox RCA-1 lesion is 60% stenosed. The lesion is segmental and eccentric. The lesion is severely calcified. Prox RCA-2 lesion is 80% stenosed. The lesion is eccentric, irregular and ulcerative. The lesion is severely calcified. Shelflike lesion Mid RCA lesion is 85% stenosed. The lesion is focal and concentric. The lesion is moderately calcified.  Acute Marginal Branch Vessel is small in size.  Right Ventricular Branch Vessel is small in size.  Right Posterior Descending Artery Vessel is small in size.  Right Posterior Atrioventricular Artery Vessel is moderate in size.  Second Right Posterolateral Branch Vessel is small in size.  Intervention  Prox RCA-1 lesion Angioplasty Lesion length:  16 mm. CATH SHOCKWAVE C2 3.5X12 guide catheter was inserted. WIRE ASAHI PROWATER 180CM guidewire used to cross lesion. Balloon angioplasty was performed. Maximum pressure: 4 atm. Inflation time: 10 sec. A total of 4 bursts of 10 pulses Stent (Also treats lesions: Prox RCA-2, and Mid RCA) Lesion length:  36 mm. CATH VISTA GUIDE 6FR JR4 guide catheter was inserted. Lesion crossed with guidewire using a WIRE ASAHI PROWATER 180CM. Pre-stent angioplasty was performed using a BALLN SAPPHIRE 3.25X20. Maximum pressure:  12 atm. Inflation time:  20 sec. BALLN SAPPHIRE 2.5X20 -> 12 ATM x 20 sec A drug-eluting stent was successfully placed using  a SYNERGY XD 3.0X38. Following the 3.0 mm balloon angioplasty, 3.5 mm shockwave balloon used throughout the target segment; stent covers all 3 portions of the diseased segment Maximum pressure: 18 atm. Inflation time: 30 sec. Stent strut is well apposed. Postdilated to 3.6 mm Post-stent angioplasty was performed using a BALL SAPPHIRE NC24 3.5X18. Maximum pressure:  16 atm. Inflation time:  20  sec. Also 3.5 x 8 [place] balloon used-18 ATM x 20 seconds throughout the entire stent Post-Intervention Lesion Assessment The intervention was successful. Pre-interventional TIMI flow is 3. Post-intervention TIMI flow is 3. Treated lesion length:  38 mm. No complications occurred at this lesion. There is a 10% residual stenosis post intervention.  Prox RCA-2 lesion Angioplasty Lesion length:  8 mm. CATH SHOCKWAVE C2 3.5X12 guide catheter was inserted. WIRE ASAHI PROWATER 180CM guidewire used to cross lesion. Balloon angioplasty was performed. Stent (Also treats lesions: Prox RCA-1, and Mid RCA) See details in Prox RCA-1 lesion. Post-Intervention Lesion Assessment The intervention was successful. Pre-interventional TIMI flow is 3. Post-intervention TIMI flow is 3. Treated lesion length:  10 mm. No complications occurred at this lesion. There is a 0% residual stenosis post intervention.  Mid RCA lesion Angioplasty Lesion length:  8 mm. CATH SHOCKWAVE C2 3.5X12 guide catheter was inserted. WIRE ASAHI PROWATER 180CM guidewire used to cross lesion. Balloon angioplasty was performed. Lithotripsy balloon Stent (Also treats lesions: Prox RCA-1, and Prox RCA-2) See details in Prox RCA-1 lesion. Post-Intervention Lesion Assessment The intervention was successful. Pre-interventional TIMI flow is 3. Post-intervention TIMI flow is 3. There is a 0% residual stenosis post intervention.   STRESS TESTS  MYOCARDIAL PERFUSION IMAGING 07/28/2022  Interpretation Summary   Findings are consistent with no ischemia and no infarction. The study is low risk.   No ST deviation was noted.   Left ventricular function is normal. End diastolic cavity size is normal.   Prior study available for comparison from 01/15/2021.   ECHOCARDIOGRAM  ECHOCARDIOGRAM COMPLETE 10/23/2022  Narrative ECHOCARDIOGRAM REPORT    Patient Name:   Bartholomew Linsey Date of Exam: 10/23/2022 Medical Rec #:  [id number]    Height:       72.0  in Accession #:    [id number]   Weight:       213.0 lb Date of Birth:  04/22/1955    BSA:          2.188 m Patient Age:    67 years     BP:           116/71 mmHg Patient Gender: M            HR:           63 bpm. Exam Location:  Inpatient  Procedure: 2D Echo, Color Doppler, Cardiac Doppler and Strain Analysis  Indications:    NSTEMI I21.4  History:        Patient has prior history of Echocardiogram examinations. Signs/Symptoms:Murmur; Risk Factors:Hypertension.  Sonographer:    Bari Roar Referring Phys: [id number] [facility] H HENDERSON   Sonographer Comments: Global longitudinal strain was attempted. IMPRESSIONS   1. Left ventricular ejection fraction, by estimation, is 55 to 60%. The left ventricle has normal function. The left ventricle has no regional wall motion abnormalities. Left ventricular diastolic parameters were normal. The average left ventricular global longitudinal strain is -19.0 %. The global longitudinal strain is normal. 2. Right ventricular systolic function is normal. The right ventricular size is normal. There is normal pulmonary artery systolic pressure. 3. The mitral valve is  normal in structure. No evidence of mitral valve regurgitation. 4. The aortic valve is normal in structure. Aortic valve regurgitation is not visualized. 5. The inferior vena cava is normal in size with greater than 50% respiratory variability, suggesting right atrial pressure of 3 mmHg.  Conclusion(s)/Recommendation(s): Normal biventricular function without evidence of hemodynamically significant valvular heart disease.  FINDINGS Left Ventricle: Left ventricular ejection fraction, by estimation, is 55 to 60%. The left ventricle has normal function. The left ventricle has no regional wall motion abnormalities. The average left ventricular global longitudinal strain is -19.0 %. The global longitudinal strain is normal. The left ventricular internal cavity size was normal in size. There is no  left ventricular hypertrophy. Left ventricular diastolic parameters were normal.  Right Ventricle: The right ventricular size is normal. Right ventricular systolic function is normal. There is normal pulmonary artery systolic pressure. The tricuspid regurgitant velocity is 2.35 m/s, and with an assumed right atrial pressure of 3 mmHg, the estimated right ventricular systolic pressure is 25.1 mmHg.  Left Atrium: Left atrial size was normal in size.  Right Atrium: Right atrial size was normal in size.  Pericardium: There is no evidence of pericardial effusion.  Mitral Valve: The mitral valve is normal in structure. No evidence of mitral valve regurgitation. MV peak gradient, 2.9 mmHg. The mean mitral valve gradient is 2.0 mmHg.  Tricuspid Valve: Tricuspid valve regurgitation is mild.  Aortic Valve: The aortic valve is normal in structure. Aortic valve regurgitation is not visualized. Aortic valve mean gradient measures 2.0 mmHg. Aortic valve peak gradient measures 4.2 mmHg. Aortic valve area, by VTI measures 3.29 cm.  Pulmonic Valve: Pulmonic valve regurgitation is not visualized.  Aorta: The aortic root and ascending aorta are structurally normal, with no evidence of dilitation.  Venous: The inferior vena cava is normal in size with greater than 50% respiratory variability, suggesting right atrial pressure of 3 mmHg.  IAS/Shunts: No atrial level shunt detected by color flow Doppler.   LEFT VENTRICLE PLAX 2D LVIDd:         4.40 cm   Diastology LVIDs:         3.10 cm   LV e' medial:    9.57 cm/s LV PW:         0.90 cm   LV E/e' medial:  9.5 LV IVS:        1.00 cm   LV e' lateral:   13.90 cm/s LVOT diam:     2.10 cm   LV E/e' lateral: 6.5 LV SV:         61 LV SV Index:   28        2D Longitudinal Strain LVOT Area:     3.46 cm  2D Strain GLS Avg:     -19.0 %   RIGHT VENTRICLE RV Basal diam:  3.30 cm RV Mid diam:    2.80 cm RV S prime:     15.10 cm/s TAPSE (M-mode): 2.2  cm  LEFT ATRIUM             Index        RIGHT ATRIUM           Index LA diam:        3.40 cm 1.55 cm/m   RA Area:     14.70 cm LA Vol (A2C):   45.7 ml 20.88 ml/m  RA Volume:   34.60 ml  15.81 ml/m LA Vol (A4C):   31.5 ml 14.40 ml/m LA Biplane Vol: 39.6  ml 18.10 ml/m AORTIC VALVE                    PULMONIC VALVE AV Area (Vmax):    3.09 cm     PV Vmax:          1.01 m/s AV Area (Vmean):   3.02 cm     PV Peak grad:     4.1 mmHg AV Area (VTI):     3.29 cm     PR End Diast Vel: 3.76 msec AV Vmax:           102.00 cm/s  RVOT Peak grad:   2 mmHg AV Vmean:          69.600 cm/s AV VTI:            0.184 m AV Peak Grad:      4.2 mmHg AV Mean Grad:      2.0 mmHg LVOT Vmax:         91.00 cm/s LVOT Vmean:        60.700 cm/s LVOT VTI:          0.175 m LVOT/AV VTI ratio: 0.95  AORTA Ao Sinus diam: 2.90 cm Ao STJ diam:   2.7 cm Ao Asc diam:   2.90 cm  MITRAL VALVE               TRICUSPID VALVE MV Area (PHT): 4.36 cm    TR Peak grad:   22.1 mmHg MV Area VTI:   2.55 cm    TR Vmax:        235.00 cm/s MV Peak grad:  2.9 mmHg MV Mean grad:  2.0 mmHg    SHUNTS MV Vmax:       0.85 m/s    Systemic VTI:  0.18 m MV Vmean:      60.2 cm/s   Systemic Diam: 2.10 cm MV Decel Time: 174 msec MV E velocity: 90.70 cm/s MV A velocity: 93.30 cm/s MV E/A ratio:  0.97  Photographer signed by Ronal Ross Signature Date/Time: 10/23/2022/1:54:42 PM    Final          ______________________________________________________________________________________________      EKG Interpretation Date/Time:  Wednesday February 01 2024 09:31:22 EST Ventricular Rate:  88 PR Interval:  188 QRS Duration:  78 QT Interval:  342 QTC Calculation: 413 R Axis:   24  Text Interpretation: Normal sinus rhythm Normal ECG When compared with ECG of 26-Oct-2022 05:37, No significant change was found Confirmed by Monetta Rogue ([id number]) on 02/01/2024 9:37:35 AM    Recent Labs: 04/04/2023: TSH  2.12 09/16/2023: ALT 11; BUN 19; Creatinine, Ser 0.97; Potassium 4.4; Sodium 139  Recent Lipid Panel    Component Value Date/Time   CHOL 113 09/16/2023 0813   TRIG 168 (H) 09/16/2023 0813   HDL 30 (L) 09/16/2023 0813   CHOLHDL 3.8 09/16/2023 0813   CHOLHDL 3.9 10/24/2022 0359   VLDL 29 10/24/2022 0359   LDLCALC 54 09/16/2023 0813    Physical Exam:    VS:  There were no vitals taken for this visit.    Wt Readings from Last 3 Encounters:  08/05/23 213 lb (96.6 kg)  01/28/23 211 lb 12.8 oz (96.1 kg)  11/02/22 209 lb (94.8 kg)     GEN:  Well nourished, well developed in no acute distress HEENT: Normal NECK: No JVD; No carotid bruits LYMPHATICS: No lymphadenopathy CARDIAC: RRR, no murmurs, rubs, gallops RESPIRATORY:  Clear to auscultation without rales, wheezing  or rhonchi  ABDOMEN: Soft, non-tender, non-distended MUSCULOSKELETAL:  No edema; No deformity  SKIN: Warm and dry NEUROLOGIC:  Alert and oriented x 3 PSYCHIATRIC:  Normal affect    Signed, Redell Leiter, MD  01/31/2024 7:22 PM    [facility]      [1]  No outpatient medications have been marked as taking for the 02/01/24 encounter (Appointment) with Leiter Redell PARAS, MD.   "

## 2024-02-01 ENCOUNTER — Ambulatory Visit: Attending: Cardiology | Admitting: Cardiology

## 2024-02-01 ENCOUNTER — Encounter: Payer: Self-pay | Admitting: Cardiology

## 2024-02-01 VITALS — BP 112/72 | HR 88 | Ht 72.0 in | Wt 224.8 lb

## 2024-02-01 DIAGNOSIS — J449 Chronic obstructive pulmonary disease, unspecified: Secondary | ICD-10-CM

## 2024-02-01 DIAGNOSIS — E7841 Elevated Lipoprotein(a): Secondary | ICD-10-CM | POA: Diagnosis not present

## 2024-02-01 DIAGNOSIS — E782 Mixed hyperlipidemia: Secondary | ICD-10-CM

## 2024-02-01 DIAGNOSIS — I110 Hypertensive heart disease with heart failure: Secondary | ICD-10-CM

## 2024-02-01 DIAGNOSIS — E119 Type 2 diabetes mellitus without complications: Secondary | ICD-10-CM

## 2024-02-01 DIAGNOSIS — I25118 Atherosclerotic heart disease of native coronary artery with other forms of angina pectoris: Secondary | ICD-10-CM | POA: Diagnosis not present

## 2024-02-01 MED ORDER — NITROGLYCERIN 0.4 MG SL SUBL: 0.4000 mg | SUBLINGUAL_TABLET | SUBLINGUAL | 1 refills | Status: AC | PRN

## 2024-02-01 NOTE — Patient Instructions (Signed)
 Medication Instructions:  Your physician recommends that you continue on your current medications as directed. Please refer to the Current Medication list given to you today.  *If you need a refill on your cardiac medications before your next appointment, please call your pharmacy*   Lab Work: None ordered If you have labs (blood work) drawn today and your tests are completely normal, you will receive your results only by: MyChart Message (if you have MyChart) OR A paper copy in the mail If you have any lab test that is abnormal or we need to change your treatment, we will call you to review the results.   Testing/Procedures: None ordered   Follow-Up: At [facility], you and your health needs are our priority.  As part of our continuing mission to provide you with exceptional heart care, we have created designated Provider Care Teams.  These Care Teams include your primary Cardiologist (physician) and Advanced Practice Providers (APPs -  Physician Assistants and Nurse Practitioners) who all work together to provide you with the care you need, when you need it.  We recommend signing up for the patient portal called MyChart.  Sign up information is provided on this After Visit Summary.  MyChart is used to connect with patients for Virtual Visits (Telemedicine).  Patients are able to view lab/test results, encounter notes, upcoming appointments, etc.  Non-urgent messages can be sent to your provider as well.   To learn more about what you can do with MyChart, go to [email or web address].    Your next appointment:   1 year(s)  The format for your next appointment:   In Person  Provider:   Redell Leiter, MD    Other Instructions none  Important Information About Sugar

## 2024-02-05 ENCOUNTER — Ambulatory Visit (HOSPITAL_BASED_OUTPATIENT_CLINIC_OR_DEPARTMENT_OTHER)
Admission: EM | Admit: 2024-02-05 | Discharge: 2024-02-05 | Disposition: A | Discharge status: Home / Self Care | Attending: Family Medicine | Admitting: Family Medicine

## 2024-02-05 ENCOUNTER — Ambulatory Visit (HOSPITAL_BASED_OUTPATIENT_CLINIC_OR_DEPARTMENT_OTHER): Admit: 2024-02-05 | Discharge: 2024-02-05 | Disposition: A | Admitting: Radiology

## 2024-02-05 ENCOUNTER — Encounter (HOSPITAL_BASED_OUTPATIENT_CLINIC_OR_DEPARTMENT_OTHER): Payer: Self-pay | Admitting: Emergency Medicine

## 2024-02-05 ENCOUNTER — Ambulatory Visit (HOSPITAL_BASED_OUTPATIENT_CLINIC_OR_DEPARTMENT_OTHER): Payer: Self-pay | Admitting: Family Medicine

## 2024-02-05 DIAGNOSIS — R0902 Hypoxemia: Secondary | ICD-10-CM

## 2024-02-05 DIAGNOSIS — R051 Acute cough: Secondary | ICD-10-CM

## 2024-02-05 DIAGNOSIS — J208 Acute bronchitis due to other specified organisms: Secondary | ICD-10-CM

## 2024-02-05 DIAGNOSIS — J101 Influenza due to other identified influenza virus with other respiratory manifestations: Secondary | ICD-10-CM | POA: Diagnosis not present

## 2024-02-05 LAB — POCT INFLUENZA A/B
Influenza A, POC: POSITIVE — AB
Influenza B, POC: NEGATIVE

## 2024-02-05 MED ORDER — ALBUTEROL SULFATE HFA 108 (90 BASE) MCG/ACT IN AERS: 2.0000 | INHALATION_SPRAY | Freq: Four times a day (QID) | RESPIRATORY_TRACT | 0 refills | Status: AC | PRN

## 2024-02-05 MED ORDER — PROMETHAZINE-DM 6.25-15 MG/5ML PO SYRP: 5.0000 mL | ORAL_SOLUTION | Freq: Four times a day (QID) | ORAL | 0 refills | Status: AC | PRN

## 2024-02-05 MED ORDER — PREDNISONE 20 MG PO TABS: 20.0000 mg | ORAL_TABLET | Freq: Every day | ORAL | 0 refills | Status: AC

## 2024-02-05 MED ORDER — OSELTAMIVIR PHOSPHATE 75 MG PO CAPS: 75.0000 mg | ORAL_CAPSULE | Freq: Two times a day (BID) | ORAL | 0 refills | Status: AC

## 2024-02-05 NOTE — ED Triage Notes (Signed)
 Pt c/o coughing, fever, weakness, body aches, headache started yesterday

## 2024-02-05 NOTE — Discharge Instructions (Addendum)
 Acute viral bronchitis with cough, hypoxia secondary to influenza type A: Chest x-ray is hazy but does not show consolidation of pneumonia.  Oxygen saturation is 92-94% on room air.  Patient and his wife encouraged to get a pulse oximeter at the drugstore to monitor his oxygen levels.  Oxygen levels 90-93% are not great but are good enough to stay at home.  A goal for the oxygen is 94% or higher.  Oxygen levels 89% or lower indicating need to go to an emergency room or call 911.  Tamiflu  75 mg twice daily for 5 days.  Albuterol  inhaler, 2 puffs, every 4 hours as needed for wheezing.  Methixene DM, 5 mL, every 6 hours if needed for cough.  Prednisone  20 mg daily for 5 days.  Prednisone  will help the lungs but will increase the blood sugars monitor blood sugars closely and go is low-carb or low sugar with your diet as possible.  Follow-up if symptoms do not improve, worsen or new symptoms occur.

## 2024-02-05 NOTE — Progress Notes (Signed)
 No acute cardiopulmonary disease.  No pneumonia.  Patient's wife was updated of these results.

## 2024-02-05 NOTE — ED Provider Notes (Signed)
 " [name]    CSN: [id number] Arrival date & time: 02/05/24  1258      History   Chief Complaint Chief Complaint  Patient presents with   Cough   Fever    HPI Scott Koch is a 69 y.o. male.   69 year old male with his wife with complaint of cough, fever, weakness, body aches, headache.  Symptoms started on 02/04/2024.  He feels pretty congested in his chest.   Cough Associated symptoms: fever, headaches and rhinorrhea   Associated symptoms: no chest pain, no chills, no ear pain, no rash and no sore throat   Fever Associated symptoms: congestion, cough, headaches and rhinorrhea   Associated symptoms: no chest pain, no chills, no diarrhea, no dysuria, no ear pain, no nausea, no rash, no sore throat and no vomiting     Past Medical History:  Diagnosis Date   Asthma    inhaler used only when seasonal allergies    Chronic diastolic heart failure (HCC) 10/24/2022   Complication of anesthesia    patient statesgets rowdy when wake up   COPD (chronic obstructive pulmonary disease) (HCC)    COPD (chronic obstructive pulmonary disease) (HCC) 10/22/2020   Diabetes mellitus without complication (HCC)    fasting blood sugar avg 120   Heart murmur    HNP (herniated nucleus pulposus), lumbar 01/07/2014   Hypertension    Hypothyroidism    NSTEMI (non-ST elevated myocardial infarction) (HCC) 10/23/2022   Pneumonia    hx   Protein in urine    Type 2 diabetes mellitus with complication, with long-term current use of insulin  (HCC) 10/23/2022    Patient Active Problem List   Diagnosis Date Noted   Chronic diastolic heart failure (HCC) 10/24/2022   NSTEMI (non-ST elevated myocardial infarction) (HCC) 10/23/2022   Type 2 diabetes mellitus with complication, with long-term current use of insulin  (HCC) 10/23/2022   Asthma 10/22/2020   Complication of anesthesia 10/22/2020   COPD (chronic obstructive pulmonary disease) (HCC) 10/22/2020   Heart murmur 10/22/2020    Hypertension 10/22/2020   Hypothyroidism 10/22/2020   Pneumonia 10/22/2020   Protein in urine 10/22/2020   HNP (herniated nucleus pulposus), lumbar 01/07/2014    Past Surgical History:  Procedure Laterality Date   ANTERIOR CERVICAL DECOMP/DISCECTOMY FUSION  01/13/2012   Procedure: ANTERIOR CERVICAL DECOMPRESSION/DISCECTOMY FUSION 3 LEVELS;  Surgeon: Arley SHAUNNA Helling, MD;  Location: [facility];  Service: Neurosurgery;  Laterality: N/A;  Cervical three-four, cervical four five, cervical six-seven anterior cervical decompression fusion with plate    CORONARY ANGIOPLASTY  2000   stent   CORONARY LITHOTRIPSY N/A 10/25/2022   Procedure: CORONARY LITHOTRIPSY;  Surgeon: Anner Alm ORN, MD;  Location: [facility] INVASIVE CV LAB;  Service: Cardiovascular;  Laterality: N/A;   CORONARY STENT INTERVENTION N/A 10/25/2022   Procedure: CORONARY STENT INTERVENTION;  Surgeon: Anner Alm ORN, MD;  Location: [facility] INVASIVE CV LAB;  Service: Cardiovascular;  Laterality: N/A;   LEFT HEART CATH AND CORONARY ANGIOGRAPHY N/A 10/25/2022   Procedure: LEFT HEART CATH AND CORONARY ANGIOGRAPHY;  Surgeon: Anner Alm ORN, MD;  Location: [facility] INVASIVE CV LAB;  Service: Cardiovascular;  Laterality: N/A;   SHOULDER ARTHROSCOPY Right 14       Home Medications    Prior to Admission medications  Medication Sig Start Date End Date Taking? Authorizing Provider  oseltamivir  (TAMIFLU ) 75 MG capsule Take 1 capsule (75 mg total) by mouth every 12 (twelve) hours. 02/05/24  Yes Ival Domino, FNP  predniSONE  (DELTASONE ) 20 MG tablet  Take 1 tablet (20 mg total) by mouth daily with breakfast for 5 days. 02/05/24 02/10/24 Yes Ival Domino, FNP  acetaminophen  (TYLENOL ) 500 MG tablet Take 1,000 mg by mouth 2 (two) times daily as needed for moderate pain, headache or fever.    [provider]  albuterol  (VENTOLIN  HFA) 108 (90 Base) MCG/ACT inhaler Inhale 2 puffs into the lungs every 6 (six) hours as needed for wheezing or shortness of  breath. For shortness of breath 02/05/24   Ival Domino, FNP  Bioflavonoid Products (VITAMIN C) CHEW Chew 1 tablet by mouth daily.    [provider]  cetirizine (ZYRTEC) 10 MG tablet Take 10 mg by mouth daily.    [provider]  clopidogrel  (PLAVIX ) 75 MG tablet Take 1 tablet (75 mg total) by mouth daily. 08/05/23   Carlin Delon BROCKS, NP  Dulaglutide (TRULICITY) 0.75 MG/0.5ML SOPN Inject 0.75 mg into the skin once a week.    [provider]  ipratropium-albuterol  (DUONEB) 0.5-2.5 (3) MG/3ML SOLN Take 3 mLs by nebulization every 6 (six) hours as needed (wheezing, shortness of breath).    [provider]  JANUMET XR 50-1000 MG TB24 Take 1 tablet by mouth at bedtime. 11/24/20   [provider]  ketoconazole  (NIZORAL ) 2 % cream Apply 1 Application topically 2 (two) times daily. Apply 1gm to heel areas twice daily and rub in well 10/07/22   McCaughan, Dia D, DPM  LANTUS SOLOSTAR 100 UNIT/ML Solostar Pen Inject 20 Units into the skin daily as needed (BS > 120 in the morning). 01/27/21   [provider]  levothyroxine  (SYNTHROID ) 100 MCG tablet Take 100 mcg by mouth daily before breakfast.    [provider]  losartan  (COZAAR ) 25 MG tablet Take 25 mg by mouth daily. 02/25/21   [provider]  montelukast  (SINGULAIR ) 10 MG tablet Take 10 mg by mouth at bedtime.    [provider]  nitroGLYCERIN  (NITROSTAT ) 0.4 MG SL tablet Place 1 tablet (0.4 mg total) under the tongue every 5 (five) minutes x 3 doses as needed for chest pain. 02/01/24   Monetta Redell PARAS, MD  omeprazole (PRILOSEC) 20 MG capsule Take 20 mg by mouth daily. 02/25/21   [provider]  [facility] VERIO test strip 1 each 2 (two) times daily. 12/15/23   [provider]  promethazine -dextromethorphan (PROMETHAZINE -DM) 6.25-15 MG/5ML syrup Take 5 mLs by mouth 4 (four) times daily as needed for cough. 02/05/24   Ival Domino, FNP  rosuvastatin  (CRESTOR ) 40 MG  tablet Take 1 tablet (40 mg total) by mouth daily. 08/05/23   Carlin Delon BROCKS, NP  tamsulosin  (FLOMAX ) 0.4 MG CAPS capsule Take 0.4 mg by mouth daily. 09/15/21   [provider]  torsemide  (DEMADEX ) 20 MG tablet TAKE ONE TABLET BY MOUTH DAILY 12/26/23   Carlin Delon BROCKS, NP  UNIFINE PENTIPS 32G X 4 MM MISC  12/05/23   [provider]  Vitamin D, Ergocalciferol, (DRISDOL) 1.25 MG (50000 UNIT) CAPS capsule Take 50,000 Units by mouth every Friday.    [provider]  VITAMIN E PO Take 1 capsule by mouth daily.    [provider]    Family History Family History  Problem Relation Age of Onset   Heart attack Mother    Jaundice Mother    Hypertension Mother    Heart disease Mother    Hepatitis Mother    Prostate cancer Father    Arthritis Brother    Hypertension Brother    Heart  disease Brother     Social History Social History[1]   Allergies   Hazelnut (filbert), Norel ad [chlorphen-pe-acetaminophen ], Penicillins, Sulfa antibiotics, and Neosporin [neomycin-bacitracin  zn-polymyx]   Review of Systems Review of Systems  Constitutional:  Positive for fever. Negative for chills.  HENT:  Positive for congestion, postnasal drip and rhinorrhea. Negative for ear pain and sore throat.   Eyes:  Negative for pain and visual disturbance.  Respiratory:  Positive for cough.   Cardiovascular:  Negative for chest pain and palpitations.  Gastrointestinal:  Negative for abdominal pain, constipation, diarrhea, nausea and vomiting.  Genitourinary:  Negative for dysuria and hematuria.  Musculoskeletal:  Positive for arthralgias. Negative for back pain.  Skin:  Negative for color change and rash.  Neurological:  Positive for weakness and headaches. Negative for seizures and syncope.  All other systems reviewed and are negative.    Physical Exam Triage Vital Signs ED Triage Vitals  Encounter Vitals Group     BP 02/05/24 1525 134/78     Girls Systolic BP  Percentile --      Girls Diastolic BP Percentile --      Boys Systolic BP Percentile --      Boys Diastolic BP Percentile --      Pulse Rate 02/05/24 1525 (!) 117     Resp 02/05/24 1525 20     Temp 02/05/24 1525 100.2 F (37.9 C)     Temp Source 02/05/24 1525 Oral     SpO2 02/05/24 1525 91 %     Weight --      Height --      Head Circumference --      Peak Flow --      Pain Score 02/05/24 1524 0     Pain Loc --      Pain Education --      Exclude from Growth Chart --    No data found.  Updated Vital Signs BP 134/78 (BP Location: Right Arm)   Pulse (!) 117   Temp 100.2 F (37.9 C) (Oral)   Resp 20   SpO2 91%   Visual Acuity Right Eye Distance:   Left Eye Distance:   Bilateral Distance:    Right Eye Near:   Left Eye Near:    Bilateral Near:     Physical Exam Vitals and nursing note reviewed.  Constitutional:      General: He is not in acute distress.    Appearance: He is well-developed. He is ill-appearing. He is not toxic-appearing or diaphoretic.  HENT:     Head: Normocephalic and atraumatic.     Right Ear: Hearing, tympanic membrane, ear canal and external ear normal.     Left Ear: Hearing, tympanic membrane, ear canal and external ear normal.     Nose: Congestion and rhinorrhea present. Rhinorrhea is clear.     Right Sinus: No maxillary sinus tenderness or frontal sinus tenderness.     Left Sinus: No maxillary sinus tenderness or frontal sinus tenderness.     Mouth/Throat:     Lips: Pink.     Mouth: Mucous membranes are moist.     Pharynx: Uvula midline. No oropharyngeal exudate or posterior oropharyngeal erythema.     Tonsils: No tonsillar exudate.  Eyes:     Conjunctiva/sclera: Conjunctivae normal.     Pupils: Pupils are equal, round, and reactive to light.  Cardiovascular:     Rate and Rhythm: Normal rate and regular rhythm.     Heart sounds: S1 normal and S2  normal. No murmur heard. Pulmonary:     Effort: Pulmonary effort is normal. No respiratory  distress.     Breath sounds: Examination of the right-middle field reveals decreased breath sounds. Examination of the left-middle field reveals decreased breath sounds. Examination of the right-lower field reveals decreased breath sounds. Examination of the left-lower field reveals decreased breath sounds. Decreased breath sounds present. No wheezing, rhonchi or rales.     Comments: No wheezing but patient's breath sounds are decreased in the midlung and lower lung bases.  Oxygen saturation is 91-92% on room air. Abdominal:     General: Bowel sounds are normal.     Palpations: Abdomen is soft.     Tenderness: There is no abdominal tenderness.  Musculoskeletal:        General: No swelling.     Cervical back: Neck supple.  Lymphadenopathy:     Head:     Right side of head: No submental, submandibular, tonsillar, preauricular or posterior auricular adenopathy.     Left side of head: No submental, submandibular, tonsillar, preauricular or posterior auricular adenopathy.     Cervical: No cervical adenopathy.     Right cervical: No superficial cervical adenopathy.    Left cervical: No superficial cervical adenopathy.  Skin:    General: Skin is warm and dry.     Capillary Refill: Capillary refill takes less than 2 seconds.     Findings: No rash.  Neurological:     Mental Status: He is alert and oriented to person, place, and time.  Psychiatric:        Mood and Affect: Mood normal.      UC Treatments / Results  Labs (all labs ordered are listed, but only abnormal results are displayed) Labs Reviewed  POCT INFLUENZA A/B - Abnormal; Notable for the following components:      Result Value   Influenza A, POC Positive (*)    All other components within normal limits    EKG   Radiology No results found.  Procedures Procedures (including critical care time)  Medications Ordered in UC Medications - No data to display  Initial Impression / Assessment and Plan / UC Course  I have  reviewed the triage vital signs and the nursing notes.  Pertinent labs & imaging results that were available during my care of the patient were reviewed by me and considered in my medical decision making (see chart for details).  Plan of Care (see discharge instructions for additional patient precautions and education): Acute viral bronchitis with cough, hypoxia secondary to influenza type A: Chest x-ray is hazy but does not show consolidation of pneumonia.  Oxygen saturation is 92-94% on room air.  Patient and his wife encouraged to get a pulse oximeter at the drugstore to monitor his oxygen levels.  Oxygen levels 90-93% are not great but are good enough to stay at home.  A goal for the oxygen is 94% or higher.  Oxygen levels 89% or lower indicating need to go to an emergency room or call 911.  Tamiflu  75 mg twice daily for 5 days.  Albuterol  inhaler, 2 puffs, every 4 hours as needed for wheezing.  Methixene DM, 5 mL, every 6 hours if needed for cough.  Prednisone  20 mg daily for 5 days.  Prednisone  will help the lungs but will increase the blood sugars monitor blood sugars closely and go is low-carb or low sugar with your diet as possible.  Follow-up if symptoms do not improve, worsen or new symptoms occur.  I reviewed the plan of care with the patient and/or the patient's guardian.  The patient and/or guardian had time to ask questions and acknowledged that the questions were answered.  Final Clinical Impressions(s) / UC Diagnoses   Final diagnoses:  Acute cough  Type A influenza  Hypoxia  Acute viral bronchitis     Discharge Instructions      Acute viral bronchitis with cough, hypoxia secondary to influenza type A: Chest x-ray is hazy but does not show consolidation of pneumonia.  Oxygen saturation is 92-94% on room air.  Patient and his wife encouraged to get a pulse oximeter at the drugstore to monitor his oxygen levels.  Oxygen levels 90-93% are not great but are good enough to stay  at home.  A goal for the oxygen is 94% or higher.  Oxygen levels 89% or lower indicating need to go to an emergency room or call 911.  Tamiflu  75 mg twice daily for 5 days.  Albuterol  inhaler, 2 puffs, every 4 hours as needed for wheezing.  Methixene DM, 5 mL, every 6 hours if needed for cough.  Prednisone  20 mg daily for 5 days.  Prednisone  will help the lungs but will increase the blood sugars monitor blood sugars closely and go is low-carb or low sugar with your diet as possible.  Follow-up if symptoms do not improve, worsen or new symptoms occur.     ED Prescriptions     Medication Sig Dispense Auth. Provider   albuterol  (VENTOLIN  HFA) 108 (90 Base) MCG/ACT inhaler Inhale 2 puffs into the lungs every 6 (six) hours as needed for wheezing or shortness of breath. For shortness of breath 1 each Ival Domino, FNP   promethazine -dextromethorphan (PROMETHAZINE -DM) 6.25-15 MG/5ML syrup Take 5 mLs by mouth 4 (four) times daily as needed for cough. 118 mL Ival Domino, FNP   oseltamivir  (TAMIFLU ) 75 MG capsule Take 1 capsule (75 mg total) by mouth every 12 (twelve) hours. 10 capsule Ival Domino, FNP   predniSONE  (DELTASONE ) 20 MG tablet Take 1 tablet (20 mg total) by mouth daily with breakfast for 5 days. 5 tablet Jere Bostrom, FNP      PDMP not reviewed this encounter.    [1]  Social History Tobacco Use   Smoking status: Former    Current packs/day: 0.00    Average packs/day: 1 pack/day for 35.0 years (35.0 ttl pk-yrs)    Types: Cigarettes    Start date: 12/04/1978    Quit date: 12/03/2013    Years since quitting: 10.1   Smokeless tobacco: Never   Tobacco comments:    nicotine  patch  Substance Use Topics   Alcohol use: No   Drug use: No     Ival Domino, FNP 02/05/24 1640  "

## 2024-04-12 ENCOUNTER — Ambulatory Visit: Admitting: Podiatry
